# Patient Record
Sex: Male | Born: 1950 | Race: Black or African American | Hispanic: No | Smoking: Current every day smoker
Health system: Southern US, Community
[De-identification: ages and names within clinical notes are randomized; demographics above are authoritative.]

## PROBLEM LIST (undated history)

## (undated) DIAGNOSIS — N452 Orchitis: Secondary | ICD-10-CM

## (undated) DIAGNOSIS — N529 Male erectile dysfunction, unspecified: Secondary | ICD-10-CM

## (undated) DIAGNOSIS — J449 Chronic obstructive pulmonary disease, unspecified: Secondary | ICD-10-CM

## (undated) DIAGNOSIS — N200 Calculus of kidney: Secondary | ICD-10-CM

## (undated) DIAGNOSIS — M545 Low back pain, unspecified: Secondary | ICD-10-CM

## (undated) DIAGNOSIS — J45909 Unspecified asthma, uncomplicated: Secondary | ICD-10-CM

## (undated) DIAGNOSIS — M1990 Unspecified osteoarthritis, unspecified site: Secondary | ICD-10-CM

## (undated) DIAGNOSIS — N400 Enlarged prostate without lower urinary tract symptoms: Secondary | ICD-10-CM

## (undated) DIAGNOSIS — R2689 Other abnormalities of gait and mobility: Secondary | ICD-10-CM

## (undated) DIAGNOSIS — R3129 Other microscopic hematuria: Secondary | ICD-10-CM

## (undated) DIAGNOSIS — R3911 Hesitancy of micturition: Secondary | ICD-10-CM

## (undated) HISTORY — DX: Benign prostatic hyperplasia without lower urinary tract symptoms: N40.0

## (undated) HISTORY — DX: Chronic obstructive pulmonary disease, unspecified: J44.9

## (undated) HISTORY — DX: Male erectile dysfunction, unspecified: N52.9

## (undated) HISTORY — DX: Low back pain: M54.5

## (undated) HISTORY — DX: Other microscopic hematuria: R31.29

## (undated) HISTORY — DX: Low back pain, unspecified: M54.50

---

## 1997-12-16 ENCOUNTER — Ambulatory Visit (HOSPITAL_COMMUNITY): Admission: RE | Admit: 1997-12-16 | Discharge: 1997-12-16 | Payer: Self-pay | Admitting: Internal Medicine

## 1997-12-16 ENCOUNTER — Encounter: Payer: Self-pay | Admitting: Internal Medicine

## 1998-01-12 ENCOUNTER — Ambulatory Visit (HOSPITAL_COMMUNITY): Admission: RE | Admit: 1998-01-12 | Discharge: 1998-01-12 | Payer: Self-pay | Admitting: Orthopedic Surgery

## 1998-01-19 ENCOUNTER — Encounter: Payer: Self-pay | Admitting: Orthopedic Surgery

## 1998-01-19 ENCOUNTER — Ambulatory Visit (HOSPITAL_COMMUNITY): Admission: RE | Admit: 1998-01-19 | Discharge: 1998-01-19 | Payer: Self-pay | Admitting: Orthopedic Surgery

## 1998-06-14 ENCOUNTER — Encounter: Payer: Self-pay | Admitting: Internal Medicine

## 1998-06-14 ENCOUNTER — Ambulatory Visit (HOSPITAL_COMMUNITY): Admission: RE | Admit: 1998-06-14 | Discharge: 1998-06-14 | Payer: Self-pay | Admitting: Internal Medicine

## 1998-10-10 ENCOUNTER — Emergency Department (HOSPITAL_COMMUNITY): Admission: EM | Admit: 1998-10-10 | Discharge: 1998-10-10 | Payer: Self-pay | Admitting: Emergency Medicine

## 2002-11-20 ENCOUNTER — Encounter: Admission: RE | Admit: 2002-11-20 | Discharge: 2002-11-20 | Payer: Self-pay | Admitting: Internal Medicine

## 2002-11-20 ENCOUNTER — Encounter: Payer: Self-pay | Admitting: Internal Medicine

## 2004-02-10 ENCOUNTER — Ambulatory Visit: Payer: Self-pay | Admitting: Internal Medicine

## 2004-04-13 ENCOUNTER — Ambulatory Visit: Payer: Self-pay | Admitting: Internal Medicine

## 2004-06-07 ENCOUNTER — Ambulatory Visit: Payer: Self-pay | Admitting: Internal Medicine

## 2004-08-02 ENCOUNTER — Ambulatory Visit: Payer: Self-pay | Admitting: Internal Medicine

## 2004-10-11 ENCOUNTER — Ambulatory Visit: Payer: Self-pay | Admitting: Internal Medicine

## 2004-12-13 ENCOUNTER — Ambulatory Visit: Payer: Self-pay | Admitting: Internal Medicine

## 2005-03-15 ENCOUNTER — Ambulatory Visit: Payer: Self-pay | Admitting: Internal Medicine

## 2005-05-31 ENCOUNTER — Ambulatory Visit: Payer: Self-pay | Admitting: Internal Medicine

## 2005-08-01 ENCOUNTER — Ambulatory Visit: Payer: Self-pay | Admitting: Internal Medicine

## 2005-09-17 ENCOUNTER — Ambulatory Visit: Payer: Self-pay | Admitting: Internal Medicine

## 2005-11-22 ENCOUNTER — Ambulatory Visit: Payer: Self-pay | Admitting: Internal Medicine

## 2006-01-21 ENCOUNTER — Ambulatory Visit: Payer: Self-pay | Admitting: Internal Medicine

## 2006-04-24 ENCOUNTER — Ambulatory Visit: Payer: Self-pay | Admitting: Internal Medicine

## 2006-06-19 ENCOUNTER — Ambulatory Visit: Payer: Self-pay | Admitting: Internal Medicine

## 2006-09-26 ENCOUNTER — Ambulatory Visit: Payer: Self-pay | Admitting: Internal Medicine

## 2006-09-26 LAB — CONVERTED CEMR LAB
ALT: 84 units/L — ABNORMAL HIGH (ref 0–53)
AST: 38 units/L — ABNORMAL HIGH (ref 0–37)
Albumin: 3.8 g/dL (ref 3.5–5.2)
Alkaline Phosphatase: 47 units/L (ref 39–117)
BUN: 10 mg/dL (ref 6–23)
Basophils Absolute: 0.1 10*3/uL (ref 0.0–0.1)
Basophils Relative: 1.5 % — ABNORMAL HIGH (ref 0.0–1.0)
Bilirubin Urine: NEGATIVE
Bilirubin, Direct: 0.1 mg/dL (ref 0.0–0.3)
CO2: 28 meq/L (ref 19–32)
Calcium: 9 mg/dL (ref 8.4–10.5)
Chloride: 101 meq/L (ref 96–112)
Cholesterol: 217 mg/dL (ref 0–200)
Creatinine, Ser: 0.9 mg/dL (ref 0.4–1.5)
Direct LDL: 184.2 mg/dL
Eosinophils Absolute: 0.5 10*3/uL (ref 0.0–0.6)
Eosinophils Relative: 6.9 % — ABNORMAL HIGH (ref 0.0–5.0)
GFR calc Af Amer: 113 mL/min
GFR calc non Af Amer: 93 mL/min
Glucose, Bld: 107 mg/dL — ABNORMAL HIGH (ref 70–99)
HCT: 41.1 % (ref 39.0–52.0)
HDL: 33.3 mg/dL — ABNORMAL LOW (ref >39.0)
Hemoglobin, Urine: NEGATIVE
Hemoglobin: 14 g/dL (ref 13.0–17.0)
Hgb A1c MFr Bld: 5.1 % (ref 4.6–6.0)
Ketones, ur: NEGATIVE mg/dL
Leukocytes, UA: NEGATIVE
Lymphocytes Relative: 48.4 % — ABNORMAL HIGH (ref 12.0–46.0)
MCHC: 34 g/dL (ref 30.0–36.0)
MCV: 90.9 fL (ref 78.0–100.0)
Monocytes Absolute: 0.7 10*3/uL (ref 0.2–0.7)
Monocytes Relative: 11 % (ref 3.0–11.0)
Neutro Abs: 2.2 10*3/uL (ref 1.4–7.7)
Neutrophils Relative %: 32.2 % — ABNORMAL LOW (ref 43.0–77.0)
Nitrite: NEGATIVE
PSA: 4.18 ng/mL — ABNORMAL HIGH (ref 0.10–4.00)
Platelets: 200 10*3/uL (ref 150–400)
Potassium: 4.3 meq/L (ref 3.5–5.1)
RBC: 4.52 M/uL (ref 4.22–5.81)
RDW: 12.7 % (ref 11.5–14.6)
Sodium: 137 meq/L (ref 135–145)
Specific Gravity, Urine: 1.015 (ref 1.000–1.03)
TSH: 0.84 microintl units/mL (ref 0.35–5.50)
Total Bilirubin: 0.6 mg/dL (ref 0.3–1.2)
Total CHOL/HDL Ratio: 6.5
Total Protein, Urine: NEGATIVE mg/dL
Total Protein: 7.2 g/dL (ref 6.0–8.3)
Triglycerides: 148 mg/dL (ref 0–149)
Uric Acid, Serum: 6.6 mg/dL (ref 2.4–7.0)
Urine Glucose: NEGATIVE mg/dL
Urobilinogen, UA: 0.2 (ref 0.0–1.0)
VLDL: 30 mg/dL (ref 0–40)
Vit D, 1,25-Dihydroxy: 16 — ABNORMAL LOW (ref 20–57)
WBC: 6.7 10*3/uL (ref 4.5–10.5)
pH: 6.5 (ref 5.0–8.0)

## 2007-01-02 ENCOUNTER — Encounter: Payer: Self-pay | Admitting: Internal Medicine

## 2007-01-02 ENCOUNTER — Ambulatory Visit: Payer: Self-pay | Admitting: Internal Medicine

## 2007-01-02 DIAGNOSIS — M545 Low back pain: Secondary | ICD-10-CM

## 2007-01-02 DIAGNOSIS — N400 Enlarged prostate without lower urinary tract symptoms: Secondary | ICD-10-CM

## 2007-01-02 DIAGNOSIS — F4321 Adjustment disorder with depressed mood: Secondary | ICD-10-CM | POA: Insufficient documentation

## 2007-01-13 ENCOUNTER — Encounter: Payer: Self-pay | Admitting: Internal Medicine

## 2007-04-09 ENCOUNTER — Ambulatory Visit: Payer: Self-pay | Admitting: Internal Medicine

## 2007-04-09 DIAGNOSIS — K5900 Constipation, unspecified: Secondary | ICD-10-CM | POA: Insufficient documentation

## 2007-04-09 DIAGNOSIS — J449 Chronic obstructive pulmonary disease, unspecified: Secondary | ICD-10-CM

## 2007-04-17 ENCOUNTER — Encounter (INDEPENDENT_AMBULATORY_CARE_PROVIDER_SITE_OTHER): Payer: Self-pay | Admitting: *Deleted

## 2007-06-18 ENCOUNTER — Ambulatory Visit: Payer: Self-pay | Admitting: Internal Medicine

## 2007-06-18 DIAGNOSIS — N529 Male erectile dysfunction, unspecified: Secondary | ICD-10-CM

## 2007-06-19 ENCOUNTER — Ambulatory Visit: Payer: Self-pay | Admitting: Internal Medicine

## 2007-09-16 ENCOUNTER — Ambulatory Visit: Payer: Self-pay | Admitting: Internal Medicine

## 2007-09-16 DIAGNOSIS — R635 Abnormal weight gain: Secondary | ICD-10-CM

## 2007-09-16 DIAGNOSIS — R030 Elevated blood-pressure reading, without diagnosis of hypertension: Secondary | ICD-10-CM

## 2007-09-16 LAB — CONVERTED CEMR LAB
ALT: 26 units/L (ref 0–53)
AST: 22 units/L (ref 0–37)
Albumin: 3.8 g/dL (ref 3.5–5.2)
Alkaline Phosphatase: 40 units/L (ref 39–117)
Amphetamine Screen, Ur: NEGATIVE
BUN: 10 mg/dL (ref 6–23)
Barbiturate Quant, Ur: NEGATIVE
Basophils Absolute: 0 10*3/uL (ref 0.0–0.1)
Basophils Relative: 0 % (ref 0.0–1.0)
Benzodiazepines.: NEGATIVE
Bilirubin Urine: NEGATIVE
Bilirubin, Direct: 0.1 mg/dL (ref 0.0–0.3)
CO2: 27 meq/L (ref 19–32)
Calcium: 9.1 mg/dL (ref 8.4–10.5)
Chloride: 104 meq/L (ref 96–112)
Cholesterol: 199 mg/dL (ref 0–200)
Cocaine Metabolites: NEGATIVE
Creatinine, Ser: 0.9 mg/dL (ref 0.4–1.5)
Creatinine,U: 151.4 mg/dL
Eosinophils Absolute: 0.4 10*3/uL (ref 0.0–0.7)
Eosinophils Relative: 5.2 % — ABNORMAL HIGH (ref 0.0–5.0)
GFR calc Af Amer: 112 mL/min
GFR calc non Af Amer: 93 mL/min
Glucose, Bld: 83 mg/dL (ref 70–99)
HCT: 43.2 % (ref 39.0–52.0)
HDL: 34 mg/dL — ABNORMAL LOW (ref >39.0)
Hemoglobin: 14.3 g/dL (ref 13.0–17.0)
Hgb A1c MFr Bld: 5.3 % (ref 4.6–6.0)
Ketones, ur: NEGATIVE mg/dL
LDL Cholesterol: 146 mg/dL — ABNORMAL HIGH (ref 0–99)
Leukocytes, UA: NEGATIVE
Lymphocytes Relative: 35.9 % (ref 12.0–46.0)
MCHC: 33.2 g/dL (ref 30.0–36.0)
MCV: 92.4 fL (ref 78.0–100.0)
Marijuana Metabolite: POSITIVE — AB
Methadone: NEGATIVE
Monocytes Absolute: 0.8 10*3/uL (ref 0.1–1.0)
Monocytes Relative: 11 % (ref 3.0–12.0)
Neutro Abs: 3.2 10*3/uL (ref 1.4–7.7)
Neutrophils Relative %: 47.9 % (ref 43.0–77.0)
Nitrite: NEGATIVE
Opiate Screen, Urine: POSITIVE — AB
PSA: 1.43 ng/mL (ref 0.10–4.00)
Phencyclidine (PCP): NEGATIVE
Platelets: 231 10*3/uL (ref 150–400)
Potassium: 3.7 meq/L (ref 3.5–5.1)
Propoxyphene: NEGATIVE
RBC: 4.68 M/uL (ref 4.22–5.81)
RDW: 12.2 % (ref 11.5–14.6)
Sodium: 138 meq/L (ref 135–145)
Specific Gravity, Urine: 1.025 (ref 1.000–1.03)
TSH: 0.61 microintl units/mL (ref 0.35–5.50)
Total Bilirubin: 0.5 mg/dL (ref 0.3–1.2)
Total CHOL/HDL Ratio: 5.9
Total Protein, Urine: NEGATIVE mg/dL
Total Protein: 6.8 g/dL (ref 6.0–8.3)
Triglycerides: 97 mg/dL (ref 0–149)
Urine Glucose: NEGATIVE mg/dL
Urobilinogen, UA: 0.2 (ref 0.0–1.0)
VLDL: 19 mg/dL (ref 0–40)
WBC: 6.9 10*3/uL (ref 4.5–10.5)
pH: 5.5 (ref 5.0–8.0)

## 2007-09-18 ENCOUNTER — Encounter: Payer: Self-pay | Admitting: Internal Medicine

## 2007-12-22 ENCOUNTER — Ambulatory Visit: Payer: Self-pay | Admitting: Internal Medicine

## 2007-12-24 ENCOUNTER — Telehealth: Payer: Self-pay | Admitting: Internal Medicine

## 2008-01-13 ENCOUNTER — Ambulatory Visit: Payer: Self-pay | Admitting: Internal Medicine

## 2008-03-01 ENCOUNTER — Telehealth: Payer: Self-pay | Admitting: Internal Medicine

## 2008-03-31 ENCOUNTER — Ambulatory Visit: Payer: Self-pay | Admitting: Internal Medicine

## 2008-03-31 ENCOUNTER — Telehealth: Payer: Self-pay | Admitting: Internal Medicine

## 2008-04-14 ENCOUNTER — Telehealth: Payer: Self-pay | Admitting: Internal Medicine

## 2008-04-14 ENCOUNTER — Ambulatory Visit: Payer: Self-pay | Admitting: Internal Medicine

## 2008-06-07 ENCOUNTER — Ambulatory Visit: Payer: Self-pay | Admitting: Internal Medicine

## 2008-06-07 ENCOUNTER — Telehealth: Payer: Self-pay | Admitting: Internal Medicine

## 2008-06-07 LAB — CONVERTED CEMR LAB
ALT: 36 units/L (ref 0–53)
AST: 24 units/L (ref 0–37)
Albumin: 3.8 g/dL (ref 3.5–5.2)
Alkaline Phosphatase: 39 units/L (ref 39–117)
Amphetamine Screen, Ur: NEGATIVE
BUN: 12 mg/dL (ref 6–23)
Barbiturate Quant, Ur: NEGATIVE
Benzodiazepines.: NEGATIVE
Bilirubin Urine: NEGATIVE
Bilirubin, Direct: 0 mg/dL (ref 0.0–0.3)
CO2: 31 meq/L (ref 19–32)
Calcium: 9.5 mg/dL (ref 8.4–10.5)
Chloride: 103 meq/L (ref 96–112)
Cholesterol: 213 mg/dL — ABNORMAL HIGH (ref 0–200)
Cocaine Metabolites: NEGATIVE
Creatinine, Ser: 0.9 mg/dL (ref 0.4–1.5)
Creatinine,U: 76.9 mg/dL
Direct LDL: 163.3 mg/dL
GFR calc non Af Amer: 111.86 mL/min (ref >60)
Glucose, Bld: 88 mg/dL (ref 70–99)
HDL: 33.6 mg/dL — ABNORMAL LOW (ref >39.00)
Hgb A1c MFr Bld: 5.3 % (ref 4.6–6.5)
Ketones, ur: NEGATIVE mg/dL
Leukocytes, UA: NEGATIVE
Marijuana Metabolite: NEGATIVE
Methadone: NEGATIVE
Nitrite: NEGATIVE
Opiate Screen, Urine: NEGATIVE
Phencyclidine (PCP): NEGATIVE
Potassium: 4 meq/L (ref 3.5–5.1)
Propoxyphene: NEGATIVE
Sodium: 140 meq/L (ref 135–145)
Specific Gravity, Urine: 1.015 (ref 1.000–1.030)
TSH: 1.06 microintl units/mL (ref 0.35–5.50)
Total Bilirubin: 0.6 mg/dL (ref 0.3–1.2)
Total CHOL/HDL Ratio: 6
Total Protein, Urine: NEGATIVE mg/dL
Total Protein: 7.3 g/dL (ref 6.0–8.3)
Triglycerides: 139 mg/dL (ref 0.0–149.0)
Urine Glucose: NEGATIVE mg/dL
Urobilinogen, UA: 0.2 (ref 0.0–1.0)
VLDL: 27.8 mg/dL (ref 0.0–40.0)
pH: 7.5 (ref 5.0–8.0)

## 2008-06-10 ENCOUNTER — Ambulatory Visit: Payer: Self-pay | Admitting: Internal Medicine

## 2008-06-10 DIAGNOSIS — R3129 Other microscopic hematuria: Secondary | ICD-10-CM | POA: Insufficient documentation

## 2008-06-10 DIAGNOSIS — E785 Hyperlipidemia, unspecified: Secondary | ICD-10-CM

## 2008-06-30 ENCOUNTER — Encounter (INDEPENDENT_AMBULATORY_CARE_PROVIDER_SITE_OTHER): Payer: Self-pay | Admitting: *Deleted

## 2008-07-28 ENCOUNTER — Telehealth: Payer: Self-pay | Admitting: Internal Medicine

## 2008-08-06 ENCOUNTER — Encounter: Payer: Self-pay | Admitting: Internal Medicine

## 2008-09-08 ENCOUNTER — Ambulatory Visit: Payer: Self-pay | Admitting: Internal Medicine

## 2008-12-13 ENCOUNTER — Ambulatory Visit: Payer: Self-pay | Admitting: Internal Medicine

## 2009-03-17 ENCOUNTER — Ambulatory Visit: Payer: Self-pay | Admitting: Internal Medicine

## 2009-03-17 LAB — CONVERTED CEMR LAB
ALT: 40 units/L (ref 0–53)
Albumin: 3.9 g/dL (ref 3.5–5.2)
Alkaline Phosphatase: 43 units/L (ref 39–117)
Basophils Relative: 1.2 % (ref 0.0–3.0)
Bilirubin, Direct: 0.1 mg/dL (ref 0.0–0.3)
CO2: 29 meq/L (ref 19–32)
Calcium: 9.5 mg/dL (ref 8.4–10.5)
Chloride: 107 meq/L (ref 96–112)
Creatinine, Ser: 1 mg/dL (ref 0.4–1.5)
Eosinophils Absolute: 0.2 10*3/uL (ref 0.0–0.7)
Glucose, Bld: 76 mg/dL (ref 70–99)
Leukocytes, UA: NEGATIVE
Lymphocytes Relative: 38.4 % (ref 12.0–46.0)
MCHC: 31.9 g/dL (ref 30.0–36.0)
MCV: 94.1 fL (ref 78.0–100.0)
Monocytes Absolute: 1 10*3/uL (ref 0.1–1.0)
Neutrophils Relative %: 43.6 % (ref 43.0–77.0)
Nitrite: NEGATIVE
Platelets: 199 10*3/uL (ref 150.0–400.0)
RBC: 4.61 M/uL (ref 4.22–5.81)
Specific Gravity, Urine: 1.02 (ref 1.000–1.030)
Total Protein: 7.7 g/dL (ref 6.0–8.3)
Urobilinogen, UA: 0.2 (ref 0.0–1.0)
WBC: 7 10*3/uL (ref 4.5–10.5)

## 2009-04-01 ENCOUNTER — Telehealth: Payer: Self-pay | Admitting: Internal Medicine

## 2009-04-01 ENCOUNTER — Ambulatory Visit: Payer: Self-pay | Admitting: Internal Medicine

## 2009-06-30 ENCOUNTER — Telehealth: Payer: Self-pay | Admitting: Internal Medicine

## 2009-07-08 ENCOUNTER — Ambulatory Visit: Payer: Self-pay | Admitting: Internal Medicine

## 2009-09-27 ENCOUNTER — Telehealth: Payer: Self-pay | Admitting: Internal Medicine

## 2009-10-28 ENCOUNTER — Ambulatory Visit: Payer: Self-pay | Admitting: Internal Medicine

## 2009-12-13 ENCOUNTER — Ambulatory Visit: Payer: Self-pay | Admitting: Internal Medicine

## 2009-12-14 LAB — CONVERTED CEMR LAB
Bilirubin, Direct: 0.1 mg/dL (ref 0.0–0.3)
Calcium: 9.8 mg/dL (ref 8.4–10.5)
Cholesterol: 196 mg/dL (ref 0–200)
Eosinophils Absolute: 0.2 10*3/uL (ref 0.0–0.7)
Eosinophils Relative: 2.6 % (ref 0.0–5.0)
GFR calc non Af Amer: 100.85 mL/min (ref >60)
HCT: 43.4 % (ref 39.0–52.0)
Hemoglobin: 14.5 g/dL (ref 13.0–17.0)
LDL Cholesterol: 142 mg/dL — ABNORMAL HIGH (ref 0–99)
Lymphocytes Relative: 39.6 % (ref 12.0–46.0)
Monocytes Relative: 10.8 % (ref 3.0–12.0)
Neutro Abs: 3.1 10*3/uL (ref 1.4–7.7)
Potassium: 4.3 meq/L (ref 3.5–5.1)
RBC: 4.69 M/uL (ref 4.22–5.81)
RDW: 13.6 % (ref 11.5–14.6)
Sodium: 141 meq/L (ref 135–145)
TSH: 0.75 microintl units/mL (ref 0.35–5.50)
Total Bilirubin: 0.6 mg/dL (ref 0.3–1.2)
Total Protein, Urine: NEGATIVE mg/dL
Total Protein: 7.5 g/dL (ref 6.0–8.3)
Triglycerides: 67 mg/dL (ref 0.0–149.0)
Urine Glucose: NEGATIVE mg/dL
WBC: 7.1 10*3/uL (ref 4.5–10.5)
pH: 6.5 (ref 5.0–8.0)

## 2010-01-13 ENCOUNTER — Ambulatory Visit: Payer: Self-pay | Admitting: Internal Medicine

## 2010-01-13 DIAGNOSIS — M810 Age-related osteoporosis without current pathological fracture: Secondary | ICD-10-CM | POA: Insufficient documentation

## 2010-04-13 NOTE — Assessment & Plan Note (Signed)
Summary: 3 MTH FU  STC   Vital Signs:  Patient profile:   60 year old male Weight:      204 pounds BMI:     32.07 Temp:     97.8 degrees F oral Pulse rate:   103 / minute BP sitting:   132 / 84  (left arm)  Vitals Entered By: Tora Perches (April 01, 2009 8:55 AM) CC: f/u   CC:  f/u.  History of Present Illness: The patient presents for a follow up of back pain - worse, anxiety, BPH.   Preventive Screening-Counseling & Management  Alcohol-Tobacco     Smoking Status: quit  Current Medications (verified): 1)  Vicodin Es 7.5-750 Mg Tabs (Hydrocodone-Acetaminophen) .Marland Kitchen.. 1 Qid As Needed Pain 2)  Flexeril 10 Mg Tabs (Cyclobenzaprine Hcl) .... Take 1 Tablet By Mouth Three Times A Day As Needed Spasm 3)  Flomax 0.4 Mg Cp24 (Tamsulosin Hcl) .Marland Kitchen.. 1 Once Daily Po 4)  Celebrex 200 Mg  Caps (Celecoxib) .Marland Kitchen.. 1 Two Times A Day Pc 5)  Base D Polyethylene Glycol   Powd (Polyethylene Glycol 4500) .Marland KitchenMarland KitchenMarland Kitchen 17 G By Mouth Once Daily As Needed Constipation 6)  Amlodipine Besylate 5 Mg  Tabs (Amlodipine Besylate) .Marland Kitchen.. 1 By Mouth Every Day 7)  Vitamin D3 1000 Unit  Tabs (Cholecalciferol) .Marland Kitchen.. 1 Qd 8)  Viagra 100 Mg Tabs (Sildenafil Citrate) .Marland Kitchen.. 1 By Mouth Once Daily Prn 9)  Proventil Hfa 108 (90 Base) Mcg/act Aers (Albuterol Sulfate) .... 2 Inh Qid As Needed Wheezing  Allergies (verified): No Known Drug Allergies  Past History:  Past Medical History: Last updated: 06/10/2008 Low back pain Benign prostatic hypertrophy COPD ED Hyperlipidemia  Social History: Last updated: 03/31/2008 Occupation: on disability Single widower 2008, has a new GF 2010 Current Smoker Alcohol use-yes beer 6 a day Started to exercise 2009  Social History: Smoking Status:  quit  Review of Systems  The patient denies fever, weight gain, syncope, and abdominal pain.         On a diet  Physical Exam  General:  obese.   Ears:  External ear exam shows no significant lesions or deformities.  Otoscopic  examination reveals clear canals, tympanic membranes are intact bilaterally without bulging, retraction, inflammation or discharge. Hearing is grossly normal bilaterally. Nose:  External nasal examination shows no deformity or inflammation. Nasal mucosa are pink and moist without lesions or exudates. Mouth:  Oral mucosa and oropharynx without lesions or exudates.  Teeth in good repair. Neck:  No deformities, masses, or tenderness noted. Lungs:  Normal respiratory effort, chest expands symmetrically. Lungs are clear to auscultation, no crackles or wheezes. Heart:  Normal rate and regular rhythm. S1 and S2 normal without gallop, murmur, click, rub or other extra sounds. Abdomen:  Bowel sounds positive,abdomen soft and non-tender without masses, organomegaly or hernias noted. Msk:  No deformity or scoliosis noted of thoracic or lumbar spine.  Lumbar-sacral spine is tender to palpation over paraspinal muscles and painfull with the ROM  Neurologic:  No cranial nerve deficits noted. Station and gait are normal. Plantar reflexes are down-going bilaterally. DTRs are symmetrical throughout. Sensory, motor and coordinative functions appear intact. Skin:  Intact without suspicious lesions or rashes Psych:  Cognition and judgment appear intact. Alert and cooperative with normal attention span and concentration. No apparent delusions, illusions, hallucinations   Impression & Recommendations:  Problem # 1:  LOW BACK PAIN (ICD-724.2) Assessment Deteriorated  The following medications were removed from the medication list:  Vicodin Es 7.5-750 Mg Tabs (Hydrocodone-acetaminophen) .Marland Kitchen... 1 qid as needed pain His updated medication list for this problem includes:    Flexeril 10 Mg Tabs (Cyclobenzaprine hcl) .Marland Kitchen... Take 1 tablet by mouth three times a day as needed spasm    Celebrex 200 Mg Caps (Celecoxib) .Marland Kitchen... 1 two times a day pc    Hydrocodone-acetaminophen 10-325 Mg Tabs (Hydrocodone-acetaminophen) .Marland Kitchen... 1  by mouth two times a day by mouth qid prn  Problem # 2:  ERECTILE DYSFUNCTION (ICD-607.84) Assessment: Unchanged  His updated medication list for this problem includes:    Viagra 100 Mg Tabs (Sildenafil citrate) .Marland Kitchen... 1 by mouth once daily prn  Problem # 3:  WEIGHT GAIN (ICD-783.1) Assessment: Improved The labs were reviewed with the patient.   Problem # 4:  COPD (ICD-496) Assessment: Unchanged  His updated medication list for this problem includes:    Proventil Hfa 108 (90 Base) Mcg/act Aers (Albuterol sulfate) .Marland Kitchen... 2 inh qid as needed wheezing  Problem # 5:  TOBACCO USE DISORDER/SMOKER-SMOKING CESSATION DISCUSSED (ICD-305.1) Assessment: Unchanged  Encouraged smoking cessation and discussed different methods for smoking cessation.   Complete Medication List: 1)  Flexeril 10 Mg Tabs (Cyclobenzaprine hcl) .... Take 1 tablet by mouth three times a day as needed spasm 2)  Flomax 0.4 Mg Cp24 (Tamsulosin hcl) .Marland Kitchen.. 1 once daily po 3)  Celebrex 200 Mg Caps (Celecoxib) .Marland Kitchen.. 1 two times a day pc 4)  Base D Polyethylene Glycol Powd (Polyethylene glycol 4500) .Marland KitchenMarland KitchenMarland Kitchen 17 g by mouth once daily as needed constipation 5)  Amlodipine Besylate 5 Mg Tabs (Amlodipine besylate) .Marland Kitchen.. 1 by mouth every day 6)  Vitamin D3 1000 Unit Tabs (Cholecalciferol) .Marland Kitchen.. 1 qd 7)  Viagra 100 Mg Tabs (Sildenafil citrate) .Marland Kitchen.. 1 by mouth once daily prn 8)  Proventil Hfa 108 (90 Base) Mcg/act Aers (Albuterol sulfate) .... 2 inh qid as needed wheezing 9)  Hydrocodone-acetaminophen 10-325 Mg Tabs (Hydrocodone-acetaminophen) .Marland Kitchen.. 1 by mouth two times a day by mouth qid prn  Patient Instructions: 1)  Please schedule a follow-up appointment in 3 months. 2)  Try to eat more raw plant food, fresh and dry fruit, raw almonds, leafy vegetables, whole foods and less red meat, less animal fat. Poultry and fish is better for you than pork and beef. Avoid processed foods (canned soups, hot dogs, sausage, bacon , frozen dinners). Avoid  corn syrup, high fructose syrup or aspartam and Splenda  containing drinks. Honey, Agave and Stevia are better sweeteners. Make your own  dressing with olive oil, wine vinegar, lemon juce, garlic etc. for your salads. Prescriptions: VIAGRA 100 MG TABS (SILDENAFIL CITRATE) 1 by mouth once daily prn  #36 x 3   Entered and Authorized by:   Tresa Garter MD   Signed by:   Tresa Garter MD on 04/01/2009   Method used:   Print then Give to Patient   RxID:   [id number] VITAMIN D3 1000 UNIT  TABS (CHOLECALCIFEROL) 1 qd  #90 x 3   Entered and Authorized by:   Tresa Garter MD   Signed by:   Tresa Garter MD on 04/01/2009   Method used:   Print then Give to Patient   RxID:   [id number] AMLODIPINE BESYLATE 5 MG  TABS (AMLODIPINE BESYLATE) 1 by mouth every day  #90 x 3   Entered and Authorized by:   Tresa Garter MD   Signed by:   Tresa Garter MD on 04/01/2009   Method used:  Print then Give to Patient   RxID:   [id number] BASE D POLYETHYLENE GLYCOL   POWD (POLYETHYLENE GLYCOL 4500) 17 g by mouth once daily as needed constipation  #qs x 3 mo x 3   Entered and Authorized by:   Tresa Garter MD   Signed by:   Tresa Garter MD on 04/01/2009   Method used:   Print then Give to Patient   RxID:   [id number] CELEBREX 200 MG  CAPS (CELECOXIB) 1 two times a day pc  #90 x 3   Entered and Authorized by:   Tresa Garter MD   Signed by:   Tresa Garter MD on 04/01/2009   Method used:   Print then Give to Patient   RxID:   [id number] FLOMAX 0.4 MG CP24 (TAMSULOSIN HCL) 1 once daily po  #90 x 3   Entered and Authorized by:   Tresa Garter MD   Signed by:   Tresa Garter MD on 04/01/2009   Method used:   Print then Give to Patient   RxID:   [id number] FLEXERIL 10 MG TABS (CYCLOBENZAPRINE HCL) Take 1 tablet by mouth three times a day as needed spasm  #270 x 1   Entered and Authorized by:   Tresa Garter MD   Signed by:   Tresa Garter MD on 04/01/2009   Method used:   Print then Give to Patient   RxID:   [id number] HYDROCODONE-ACETAMINOPHEN 10-325 MG TABS (HYDROCODONE-ACETAMINOPHEN) 1 by mouth two times a day by mouth qid prn  #120 x 2   Entered and Authorized by:   Tresa Garter MD   Signed by:   Tresa Garter MD on 04/01/2009   Method used:   Print then Give to Patient   RxID:   [id number]

## 2010-04-13 NOTE — Progress Notes (Signed)
Summary: REFILLS  Phone Note Refill Request   Refills Requested: Medication #1:  HYDROCODONE-ACETAMINOPHEN 10-325 MG TABS 1 by mouth two times a day by mouth qid prn.  Medication #2:  FLEXERIL 10 MG TABS Take 1 tablet by mouth three times a day as needed spasm Initial call taken by: Lamar Sprinkles, CMA,  June 30, 2009 10:45 AM  Follow-up for Phone Call        ok to ref Follow-up by: Tresa Garter MD,  June 30, 2009 1:12 PM  Additional Follow-up for Phone Call Additional follow up Details #1::        sent refill on flexeril. called hydrocodone refill into pharm already Additional Follow-up by: Orlan Leavens,  June 30, 2009 4:01 PM    Prescriptions: FLEXERIL 10 MG TABS (CYCLOBENZAPRINE HCL) Take 1 tablet by mouth three times a day as needed spasm  #270 x 1   Entered by:   Orlan Leavens   Authorized by:   Tresa Garter MD   Signed by:   Orlan Leavens on 06/30/2009   Method used:   Electronically to        [facility] [address] [phone]* (retail)       [address]       [address]       [place], [place]  [address]       Ph: [phone]       Fax: [phone]   RxID:   [id number] PROVENTIL HFA 108 (90 BASE) MCG/ACT AERS (ALBUTEROL SULFATE) 2 inh qid as needed wheezing  #3 x 1   Entered by:   Lamar Sprinkles, CMA   Authorized by:   Tresa Garter MD   Signed by:   Lamar Sprinkles, CMA on 06/30/2009   Method used:   Electronically to        [facility] [address] [phone]* (retail)       [address]       [address]       [place], [place]  [address]       Ph: [phone]       Fax: [phone]   RxID:   [id number] AMLODIPINE BESYLATE 5 MG  TABS (AMLODIPINE BESYLATE) 1 by mouth every day  #90 x 1   Entered by:   Lamar Sprinkles, CMA   Authorized by:   Tresa Garter MD   Signed by:   Lamar Sprinkles, CMA on 06/30/2009   Method used:   Electronically to        [facility] [address] [phone]* (retail)       [address]       [address]  [place], [place]  [address]       Ph: [phone]       Fax: [phone]   RxID:   [id number] BASE D POLYETHYLENE GLYCOL   POWD (POLYETHYLENE GLYCOL 4500) 17 g by mouth once daily as needed constipation  #qs x 3 mo x 1   Entered by:   Lamar Sprinkles, CMA   Authorized by:   Tresa Garter MD   Signed by:   Lamar Sprinkles, CMA on 06/30/2009   Method used:   Faxed to ...       [facility] [address] [phone]* (retail)       [address]       [address]       [place], [place]  [address]       Ph: [phone]  Fax: [phone]   RxID:   [id number] CELEBREX 200 MG  CAPS (CELECOXIB) 1 two times a day pc  #90 x 1   Entered by:   Lamar Sprinkles, CMA   Authorized by:   Tresa Garter MD   Signed by:   Lamar Sprinkles, CMA on 06/30/2009   Method used:   Electronically to        [facility] [address] [phone]* (retail)       [address]       [address]       [place], [place]  [address]       Ph: [phone]       Fax: [phone]   RxID:   [id number] FLOMAX 0.4 MG CP24 (TAMSULOSIN HCL) 1 once daily po  #90 x 1   Entered by:   Lamar Sprinkles, CMA   Authorized by:   Tresa Garter MD   Signed by:   Lamar Sprinkles, CMA on 06/30/2009   Method used:   Electronically to        [facility] [address] [phone]* (retail)       [address]       [address]       [place], [place]  [address]       Ph: [phone]       Fax: [phone]   RxID:   [id number]

## 2010-04-13 NOTE — Assessment & Plan Note (Signed)
Summary: 3 mth yearly--stc   Vital Signs:  Patient profile:   60 year old male Height:      67 inches Weight:      195 pounds BMI:     30.65 Temp:     98.6 degrees F oral Pulse rate:   100 / minute Pulse rhythm:   regular Resp:     16 per minute BP sitting:   110 / 72  (left arm) Cuff size:   regular  Vitals Entered By: Lanier Prude, Beverly Gust) (January 13, 2010 10:29 AM) CC: MWV Is Patient Diabetic? No Comments pt declines EKG   CC:  MWV.  History of Present Illness: The patient presents for a preventive health examination  Patient past medical history, social history, and family history reviewed in detail no significant changes.  Patient is physically active. Depression is negative and mood is good. Hearing is normal, and able to perform activities of daily living. Risk of falling is negligible and home safety has been reviewed and is appropriate. Patient has normal height, he is overweight, and visual acuity is good with glasses . Patient has been counseled on age-appropriate routine health concerns for screening and prevention. Education, counseling done.  The patient presents for a follow up of LBP, COPD, BPH  Preventive Screening-Counseling & Management  Alcohol-Tobacco     Alcohol drinks/day: <1     Alcohol type: spirits     Smoking Status: current     Smoking Cessation Counseling: yes     Packs/Day: 1.0     Tobacco Counseling: to quit use of tobacco products  Caffeine-Diet-Exercise     Caffeine Counseling: not indicated; caffeine use is not excessive or problematic     Diet Counseling: to improve diet; diet is suboptimal     Does Patient Exercise: no     Exercise Counseling: to improve exercise regimen     Depression Counseling: not indicated; screening negative for depression  Hep-HIV-STD-Contraception     Hepatitis Risk: no risk noted     Sun Exposure-Excessive: no  Safety-Violence-Falls     Seat Belt Use: yes     Fall Risk Counseling: not indicated; no  significant falls noted      Drug Use:  no.    Comments: denies drugs  Current Medications (verified): 1)  Flexeril 10 Mg Tabs (Cyclobenzaprine Hcl) .... Take 1 Tablet By Mouth Three Times A Day As Needed Spasm 2)  Flomax 0.4 Mg Cp24 (Tamsulosin Hcl) .Marland Kitchen.. 1 Once Daily Po 3)  Celebrex 200 Mg  Caps (Celecoxib) .Marland Kitchen.. 1 Two Times A Day Pc 4)  Base D Polyethylene Glycol   Powd (Polyethylene Glycol 4500) .Marland KitchenMarland KitchenMarland Kitchen 17 G By Mouth Once Daily As Needed Constipation 5)  Amlodipine Besylate 5 Mg  Tabs (Amlodipine Besylate) .Marland Kitchen.. 1 By Mouth Every Day 6)  Vitamin D3 1000 Unit  Tabs (Cholecalciferol) .Marland Kitchen.. 1 Qd 7)  Viagra 100 Mg Tabs (Sildenafil Citrate) .Marland Kitchen.. 1 By Mouth Once Daily Prn 8)  Proventil Hfa 108 (90 Base) Mcg/act Aers (Albuterol Sulfate) .... 2 Inh Qid As Needed Wheezing 9)  Hydrocodone-Acetaminophen 10-325 Mg Tabs (Hydrocodone-Acetaminophen) .Marland Kitchen.. 1 By Mouth Three To Four Times Daily As Needed  Allergies (verified): No Known Drug Allergies  Past History:  Family History: Last updated: 04/09/2007 Family History Hypertension  Past Medical History: Low back pain Benign prostatic hypertrophy COPD ED Hyperlipidemia Osteoporosis Microscopic hematuria s/p Urol w/up  Past Surgical History: Denies surgical history  Social History: Occupation: on disability Single widower 2008, has a  new GF 2010 Current Smoker Alcohol use-yes beer 6 a day Started to exercise 2009 Drug use-no Smoking Status:  current Packs/Day:  1.0 Does Patient Exercise:  no Hepatitis Risk:  no risk noted Sun Exposure-Excessive:  no Seat Belt Use:  yes Drug Use:  no  Review of Systems       The patient complains of weight gain.  The patient denies anorexia, fever, weight loss, vision loss, decreased hearing, hoarseness, chest pain, syncope, dyspnea on exertion, peripheral edema, prolonged cough, headaches, hemoptysis, abdominal pain, melena, hematochezia, severe indigestion/heartburn, hematuria, incontinence,  genital sores, muscle weakness, suspicious skin lesions, transient blindness, difficulty walking, depression, unusual weight change, abnormal bleeding, enlarged lymph nodes, angioedema, and testicular masses.         LBP  Physical Exam  General:  NAD obese.   Ears:  External ear exam shows no significant lesions or deformities.  Otoscopic examination reveals clear canals, tympanic membranes are intact bilaterally without bulging, retraction, inflammation or discharge. Hearing is grossly normal bilaterally. Nose:  External nasal examination shows no deformity or inflammation. Nasal mucosa are pink and moist without lesions or exudates. Mouth:  Oral mucosa and oropharynx without lesions or exudates.  Teeth in good repair. Neck:  No deformities, masses, or tenderness noted. Lungs:  Normal respiratory effort, chest expands symmetrically. Lungs are clear to auscultation, no crackles or wheezes. Heart:  Normal rate and regular rhythm. S1 and S2 normal without gallop, murmur, click, rub or other extra sounds. Abdomen:  Bowel sounds positive,abdomen soft and non-tender without masses, organomegaly or hernias noted. Large abdomen Msk:  No deformity or scoliosis noted of thoracic or lumbar spine.  Lumbar-sacral spine is tender to palpation over paraspinal muscles and painfull with the ROM  Neurologic:  No cranial nerve deficits noted. Station and gait are normal. Plantar reflexes are down-going bilaterally. DTRs are symmetrical throughout. Sensory, motor and coordinative functions appear intact. Skin:  Intact without suspicious lesions or rashes Psych:  Cognition and judgment appear intact. Alert and cooperative with normal attention span and concentration. No apparent delusions, illusions, hallucinations   Impression & Recommendations:  Problem # 1:  Preventive Health Care (ICD-V70.0) Assessment New Overall doing well, age appropriate education and counseling updated and referral for appropriate  preventive services done unless declined, immunizations up to date or declined, diet counseling done if overweight, urged to quit smoking if smokes, most recent labs reviewed and current ordered if appropriate, ecg reviewed or declined (interpretation per ECG scanned in the EMR if done); information regarding Medicare Prevention requirements given if appropriate. Declined Colonoscopy  Problem # 2:  TOBACCO USE DISORDER/SMOKER-SMOKING CESSATION DISCUSSED (ICD-305.1) Assessment: Unchanged  Encouraged smoking cessation and discussed different methods for smoking cessation.   Problem # 3:  MICROSCOPIC HEMATURIA (ICD-599.72) Assessment: Unchanged s/p Urol eval before Declined repeat eval  Problem # 4:  LOW BACK PAIN (ICD-724.2) Assessment: Unchanged  His updated medication list for this problem includes:    Flexeril 10 Mg Tabs (Cyclobenzaprine hcl) .Marland Kitchen... Take 1 tablet by mouth three times a day as needed spasm    Celebrex 200 Mg Caps (Celecoxib) .Marland Kitchen... 1 two times a day pc    Hydrocodone-acetaminophen 10-325 Mg Tabs (Hydrocodone-acetaminophen) .Marland Kitchen... 1 by mouth three to four times daily as needed  Problem # 5:  WEIGHT GAIN (ICD-783.1) Assessment: Unchanged Discussed diet  Complete Medication List: 1)  Flexeril 10 Mg Tabs (Cyclobenzaprine hcl) .... Take 1 tablet by mouth three times a day as needed spasm 2)  Flomax 0.4 Mg Cp24 (  Tamsulosin hcl) .Marland Kitchen.. 1 once daily po 3)  Celebrex 200 Mg Caps (Celecoxib) .Marland Kitchen.. 1 two times a day pc 4)  Base D Polyethylene Glycol Powd (Polyethylene glycol 4500) .Marland KitchenMarland KitchenMarland Kitchen 17 g by mouth once daily as needed constipation 5)  Amlodipine Besylate 5 Mg Tabs (Amlodipine besylate) .Marland Kitchen.. 1 by mouth every day 6)  Vitamin D3 1000 Unit Tabs (Cholecalciferol) .Marland Kitchen.. 1 qd 7)  Viagra 100 Mg Tabs (Sildenafil citrate) .Marland Kitchen.. 1 by mouth once daily prn 8)  Proventil Hfa 108 (90 Base) Mcg/act Aers (Albuterol sulfate) .... 2 inh qid as needed wheezing 9)  Hydrocodone-acetaminophen 10-325 Mg Tabs  (Hydrocodone-acetaminophen) .Marland Kitchen.. 1 by mouth three to four times daily as needed  Other Orders: Medicare -1st Annual Wellness Visit [id number]) Tdap => 67yrs IM (11914) Admin 1st Vaccine (78295) Flu Vaccine 73yrs + MEDICARE PATIENTS (A2130) Administration Flu vaccine - MCR (Q6578)  Patient Instructions: 1)  Please schedule a follow-up appointment in 3 months. Prescriptions: HYDROCODONE-ACETAMINOPHEN 10-325 MG TABS (HYDROCODONE-ACETAMINOPHEN) 1 by mouth three to four times daily as needed  #120 x 2   Entered and Authorized by:   Tresa Garter MD   Signed by:   Tresa Garter MD on 01/13/2010   Method used:   Print then Give to Patient   RxID:   [id number]    Orders Added: 1)  Medicare -1st Annual Wellness Visit [G0438] 2)  Tdap => 34yrs IM [90715] 3)  Admin 1st Vaccine [90471] 4)  Flu Vaccine 40yrs + MEDICARE PATIENTS [Q2039] 5)  Administration Flu vaccine - MCR [G0008] 6)  Est. Patient Level IV [10272]   Immunizations Administered:  Tetanus Vaccine:    Vaccine Type: Tdap    Site: left deltoid    Mfr: GlaxoSmithKline    Dose: 0.5 ml    Route: IM    Given by: Lanier Prude, CMA(AAMA)    Exp. Date: 12/30/2011    Lot #: [id number]    VIS given: 01/28/08 version given January 13, 2010.   Contraindications/Deferment of Procedures/Staging:    Test/Procedure: Colonoscopy    Reason for deferment: patient declined   Immunizations Administered:  Tetanus Vaccine:    Vaccine Type: Tdap    Site: left deltoid    Mfr: GlaxoSmithKline    Dose: 0.5 ml    Route: IM    Given by: Lanier Prude, CMA(AAMA)    Exp. Date: 12/30/2011    Lot #: [id number]    VIS given: 01/28/08 version given January 13, 2010.  Influenza Vaccine (to be given today)     Flu Vaccine Consent Questions     Do you have a history of severe allergic reactions to this vaccine? no    Any prior history of allergic reactions to egg and/or gelatin? no    Do you have a sensitivity to the  preservative Thimersol? no    Do you have a past history of Guillan-Barre Syndrome? no    Do you currently have an acute febrile illness? no    Have you ever had a severe reaction to latex? no    Vaccine information given and explained to patient? yes    Are you currently pregnant? no    Lot Number:[id number]   Exp Date:09/09/2010   Site Given  Right Deltoid IM Lanier Prude, [facility])  January 13, 2010 11:14 AM

## 2010-04-13 NOTE — Progress Notes (Signed)
Summary: Rf Hydroco/Acetam  Phone Note Refill Request Message from:  Pharmacy  Refills Requested: Medication #1:  HYDROCODONE-ACETAMINOPHEN 10-325 MG TABS 1 by mouth two times a day by mouth qid prn.   Dosage confirmed as above?Dosage Confirmed *****[facility]   Method Requested: Telephone to Pharmacy Initial call taken by: Lanier Prude, [facility]),  September 27, 2009 9:22 AM  Follow-up for Phone Call        ok to ref Follow-up by: Tresa Garter MD,  September 27, 2009 1:16 PM  Additional Follow-up for Phone Call Additional follow up Details #1::        Rx called to pharmacy Additional Follow-up by: Lanier Prude, [facility]),  September 27, 2009 1:38 PM    Prescriptions: HYDROCODONE-ACETAMINOPHEN 10-325 MG TABS (HYDROCODONE-ACETAMINOPHEN) 1 by mouth two times a day by mouth qid prn  #120 x 1   Entered by:   Lanier Prude, CMA(AAMA)   Authorized by:   Tresa Garter MD   Signed by:   Lanier Prude, CMA(AAMA) on 09/27/2009   Method used:   Telephoned to ...       [facility] [address] [phone]* (retail)       [address]       [address]       [place], [place]  [address]       Ph: [phone]       Fax: [phone]   RxID:   [id number]

## 2010-04-13 NOTE — Assessment & Plan Note (Signed)
Summary: 3 MOS F/U / # CD   Vital Signs:  Patient profile:   60 year old male Height:      67 inches Weight:      193 pounds BMI:     30.34 O2 Sat:      95 % on Room air Temp:     98.3 degrees F oral Pulse rate:   89 / minute Pulse rhythm:   regular Resp:     16 per minute BP sitting:   136 / 80  (left arm) Cuff size:   regular  Vitals Entered By: Lanier Prude, CMA(AAMA) (October 28, 2009 11:20 AM)  O2 Flow:  Room air CC: 3 mo f/u Is Patient Diabetic? No   CC:  3 mo f/u.  History of Present Illness: The patient presents for a follow up of LBP, ED, OA Loosing wt on diet   Current Medications (verified): 1)  Flexeril 10 Mg Tabs (Cyclobenzaprine Hcl) .... Take 1 Tablet By Mouth Three Times A Day As Needed Spasm 2)  Flomax 0.4 Mg Cp24 (Tamsulosin Hcl) .Marland Kitchen.. 1 Once Daily Po 3)  Celebrex 200 Mg  Caps (Celecoxib) .Marland Kitchen.. 1 Two Times A Day Pc 4)  Base D Polyethylene Glycol   Powd (Polyethylene Glycol 4500) .Marland KitchenMarland KitchenMarland Kitchen 17 G By Mouth Once Daily As Needed Constipation 5)  Amlodipine Besylate 5 Mg  Tabs (Amlodipine Besylate) .Marland Kitchen.. 1 By Mouth Every Day 6)  Vitamin D3 1000 Unit  Tabs (Cholecalciferol) .Marland Kitchen.. 1 Qd 7)  Viagra 100 Mg Tabs (Sildenafil Citrate) .Marland Kitchen.. 1 By Mouth Once Daily Prn 8)  Proventil Hfa 108 (90 Base) Mcg/act Aers (Albuterol Sulfate) .... 2 Inh Qid As Needed Wheezing 9)  Hydrocodone-Acetaminophen 10-325 Mg Tabs (Hydrocodone-Acetaminophen) .Marland Kitchen.. 1 By Mouth Two Times A Day By Mouth Qid Prn  Allergies (verified): No Known Drug Allergies  Past History:  Past Medical History: Last updated: 06/10/2008 Low back pain Benign prostatic hypertrophy COPD ED Hyperlipidemia  Social History: Last updated: 03/31/2008 Occupation: on disability Single widower 2008, has a new GF 2010 Current Smoker Alcohol use-yes beer 6 a day Started to exercise 2009  Review of Systems  The patient denies chest pain, prolonged cough, and abdominal pain.    Physical Exam  General:  NAD obese.    Nose:  External nasal examination shows no deformity or inflammation. Nasal mucosa are pink and moist without lesions or exudates. Mouth:  Oral mucosa and oropharynx without lesions or exudates.  Teeth in good repair. Neck:  No deformities, masses, or tenderness noted. Heart:  Normal rate and regular rhythm. S1 and S2 normal without gallop, murmur, click, rub or other extra sounds. Abdomen:  Bowel sounds positive,abdomen soft and non-tender without masses, organomegaly or hernias noted. Large abdomen Msk:  No deformity or scoliosis noted of thoracic or lumbar spine.  Lumbar-sacral spine is tender to palpation over paraspinal muscles and painfull with the ROM  Neurologic:  No cranial nerve deficits noted. Station and gait are normal. Plantar reflexes are down-going bilaterally. DTRs are symmetrical throughout. Sensory, motor and coordinative functions appear intact.   Impression & Recommendations:  Problem # 1:  LOW BACK PAIN (ICD-724.2) Assessment Unchanged  His updated medication list for this problem includes:    Flexeril 10 Mg Tabs (Cyclobenzaprine hcl) .Marland Kitchen... Take 1 tablet by mouth three times a day as needed spasm    Celebrex 200 Mg Caps (Celecoxib) .Marland Kitchen... 1 two times a day pc    Hydrocodone-acetaminophen 10-325 Mg Tabs (Hydrocodone-acetaminophen) .Marland Kitchen... 1 by mouth two  times a day by mouth qid prn  Problem # 2:  COPD (ICD-496) Assessment: Improved  His updated medication list for this problem includes:    Proventil Hfa 108 (90 Base) Mcg/act Aers (Albuterol sulfate) .Marland Kitchen... 2 inh qid as needed wheezing  Problem # 3:  ERECTILE DYSFUNCTION (ICD-607.84) Assessment: Unchanged  His updated medication list for this problem includes:    Viagra 100 Mg Tabs (Sildenafil citrate) .Marland Kitchen... 1 by mouth once daily prn  Problem # 4:  TOBACCO USE DISORDER/SMOKER-SMOKING CESSATION DISCUSSED (ICD-305.1) Assessment: Unchanged  Encouraged smoking cessation and discussed different methods for smoking  cessation.   Complete Medication List: 1)  Flexeril 10 Mg Tabs (Cyclobenzaprine hcl) .... Take 1 tablet by mouth three times a day as needed spasm 2)  Flomax 0.4 Mg Cp24 (Tamsulosin hcl) .Marland Kitchen.. 1 once daily po 3)  Celebrex 200 Mg Caps (Celecoxib) .Marland Kitchen.. 1 two times a day pc 4)  Base D Polyethylene Glycol Powd (Polyethylene glycol 4500) .Marland KitchenMarland KitchenMarland Kitchen 17 g by mouth once daily as needed constipation 5)  Amlodipine Besylate 5 Mg Tabs (Amlodipine besylate) .Marland Kitchen.. 1 by mouth every day 6)  Vitamin D3 1000 Unit Tabs (Cholecalciferol) .Marland Kitchen.. 1 qd 7)  Viagra 100 Mg Tabs (Sildenafil citrate) .Marland Kitchen.. 1 by mouth once daily prn 8)  Proventil Hfa 108 (90 Base) Mcg/act Aers (Albuterol sulfate) .... 2 inh qid as needed wheezing 9)  Hydrocodone-acetaminophen 10-325 Mg Tabs (Hydrocodone-acetaminophen) .Marland Kitchen.. 1 by mouth two times a day by mouth qid prn  Patient Instructions: 1)  Please schedule a follow-up appointment in 3 months well w/labs. Prescriptions: HYDROCODONE-ACETAMINOPHEN 10-325 MG TABS (HYDROCODONE-ACETAMINOPHEN) 1 by mouth two times a day by mouth qid prn  #120 x 2   Entered and Authorized by:   Tresa Garter MD   Signed by:   Tresa Garter MD on 10/28/2009   Method used:   Print then Give to Patient   RxID:   [id number] VIAGRA 100 MG TABS (SILDENAFIL CITRATE) 1 by mouth once daily prn  #36 x 3   Entered and Authorized by:   Tresa Garter MD   Signed by:   Tresa Garter MD on 10/28/2009   Method used:   Print then Give to Patient   RxID:   [id number] PROVENTIL HFA 108 (90 BASE) MCG/ACT AERS (ALBUTEROL SULFATE) 2 inh qid as needed wheezing  #3 x 3   Entered and Authorized by:   Tresa Garter MD   Signed by:   Tresa Garter MD on 10/28/2009   Method used:   Print then Give to Patient   RxID:   [id number] AMLODIPINE BESYLATE 5 MG  TABS (AMLODIPINE BESYLATE) 1 by mouth every day  #90 x 3   Entered and Authorized by:   Tresa Garter MD   Signed by:   Tresa Garter MD on 10/28/2009   Method used:   Print then Give to Patient   RxID:   [id number] CELEBREX 200 MG  CAPS (CELECOXIB) 1 two times a day pc  #90 x 3   Entered and Authorized by:   Tresa Garter MD   Signed by:   Tresa Garter MD on 10/28/2009   Method used:   Print then Give to Patient   RxID:   [id number] FLOMAX 0.4 MG CP24 (TAMSULOSIN HCL) 1 once daily po  #90 x 3   Entered and Authorized by:   Tresa Garter MD   Signed by:   Georgina Quint Plotnikov  MD on 10/28/2009   Method used:   Print then Give to Patient   RxID:   [id number] FLEXERIL 10 MG TABS (CYCLOBENZAPRINE HCL) Take 1 tablet by mouth three times a day as needed spasm  #270 x 1   Entered and Authorized by:   Tresa Garter MD   Signed by:   Tresa Garter MD on 10/28/2009   Method used:   Print then Give to Patient   RxID:   [id number]

## 2010-04-13 NOTE — Progress Notes (Signed)
Summary: Clarify RX  Phone Note From Pharmacy   Summary of Call: [facility] needs clarification on pain prescription. Please verify if patient should take two times a day or four times a day. Initial call taken by: Lucious Groves,  April 01, 2009 11:45 AM  Follow-up for Phone Call        pharmacy rtc -----per Dr. Posey Rea qid ---per Alvy Beal  pharmacy is aware .Harland German Follow-up by: Tora Perches,  April 01, 2009 12:56 PM

## 2010-04-13 NOTE — Assessment & Plan Note (Signed)
Summary: 3 MTH FU---STC   Vital Signs:  Patient profile:   60 year old male Height:      67 inches Weight:      209 pounds BMI:     32.85 O2 Sat:      98 % on Room air Temp:     98.2 degrees F oral Pulse rate:   99 / minute BP sitting:   148 / 88  (left arm) Cuff size:   large  Vitals Entered By: Bill Salinas CMA (July 08, 2009 8:55 AM)  O2 Flow:  Room air CC: FOllow-up visit, pt is due for an office visit and has never had a colonoscopy/ ab   CC:  FOllow-up visit and pt is due for an office visit and has never had a colonoscopy/ ab.  History of Present Illness: The patient presents for a follow up of back pain, anxiety, depression and headaches.   Current Medications (verified): 1)  Flexeril 10 Mg Tabs (Cyclobenzaprine Hcl) .... Take 1 Tablet By Mouth Three Times A Day As Needed Spasm 2)  Flomax 0.4 Mg Cp24 (Tamsulosin Hcl) .Marland Kitchen.. 1 Once Daily Po 3)  Celebrex 200 Mg  Caps (Celecoxib) .Marland Kitchen.. 1 Two Times A Day Pc 4)  Base D Polyethylene Glycol   Powd (Polyethylene Glycol 4500) .Marland KitchenMarland KitchenMarland Kitchen 17 G By Mouth Once Daily As Needed Constipation 5)  Amlodipine Besylate 5 Mg  Tabs (Amlodipine Besylate) .Marland Kitchen.. 1 By Mouth Every Day 6)  Vitamin D3 1000 Unit  Tabs (Cholecalciferol) .Marland Kitchen.. 1 Qd 7)  Viagra 100 Mg Tabs (Sildenafil Citrate) .Marland Kitchen.. 1 By Mouth Once Daily Prn 8)  Proventil Hfa 108 (90 Base) Mcg/act Aers (Albuterol Sulfate) .... 2 Inh Qid As Needed Wheezing 9)  Hydrocodone-Acetaminophen 10-325 Mg Tabs (Hydrocodone-Acetaminophen) .Marland Kitchen.. 1 By Mouth Two Times A Day By Mouth Qid Prn  Allergies (verified): No Known Drug Allergies  Past History:  Past Medical History: Last updated: 06/10/2008 Low back pain Benign prostatic hypertrophy COPD ED Hyperlipidemia  Social History: Last updated: 03/31/2008 Occupation: on disability Single widower 2008, has a new GF 2010 Current Smoker Alcohol use-yes beer 6 a day Started to exercise 2009  Review of Systems       The patient complains of weight  gain and dyspnea on exertion.  The patient denies chest pain and abdominal pain.    Physical Exam  General:  NAD obese.   Ears:  External ear exam shows no significant lesions or deformities.  Otoscopic examination reveals clear canals, tympanic membranes are intact bilaterally without bulging, retraction, inflammation or discharge. Hearing is grossly normal bilaterally. Mouth:  Oral mucosa and oropharynx without lesions or exudates.  Teeth in good repair. Neck:  No deformities, masses, or tenderness noted. Lungs:  Normal respiratory effort, chest expands symmetrically. Lungs are clear to auscultation, no crackles or wheezes. Heart:  Normal rate and regular rhythm. S1 and S2 normal without gallop, murmur, click, rub or other extra sounds. Abdomen:  Bowel sounds positive,abdomen soft and non-tender without masses, organomegaly or hernias noted. Large abdomen Msk:  No deformity or scoliosis noted of thoracic or lumbar spine.  Lumbar-sacral spine is tender to palpation over paraspinal muscles and painfull with the ROM  Neurologic:  No cranial nerve deficits noted. Station and gait are normal. Plantar reflexes are down-going bilaterally. DTRs are symmetrical throughout. Sensory, motor and coordinative functions appear intact. Skin:  Intact without suspicious lesions or rashes Psych:  Cognition and judgment appear intact. Alert and cooperative with normal attention span and concentration. No  apparent delusions, illusions, hallucinations   Impression & Recommendations:  Problem # 1:  LOW BACK PAIN (ICD-724.2) Assessment Unchanged  His updated medication list for this problem includes:    Flexeril 10 Mg Tabs (Cyclobenzaprine hcl) .Marland Kitchen... Take 1 tablet by mouth three times a day as needed spasm    Celebrex 200 Mg Caps (Celecoxib) .Marland Kitchen... 1 two times a day pc    Hydrocodone-acetaminophen 10-325 Mg Tabs (Hydrocodone-acetaminophen) .Marland Kitchen... 1 by mouth two times a day by mouth qid prn  Problem # 2:  WEIGHT  GAIN (ICD-783.1) Assessment: Deteriorated See "Patient Instructions".   Problem # 3:  ERECTILE DYSFUNCTION (ICD-607.84) Assessment: Unchanged  His updated medication list for this problem includes:    Viagra 100 Mg Tabs (Sildenafil citrate) .Marland Kitchen... 1 by mouth once daily prn  Problem # 4:  BENIGN PROSTATIC HYPERTROPHY (ICD-600.00) Assessment: Unchanged  His updated medication list for this problem includes:    Flomax 0.4 Mg Cp24 (Tamsulosin hcl) .Marland Kitchen... 1 once daily po  Problem # 5:  ELEVATED BP (ICD-796.2) Assessment: Deteriorated  His updated medication list for this problem includes:    Amlodipine Besylate 5 Mg Tabs (Amlodipine besylate) .Marland Kitchen... 1 by mouth every day  Complete Medication List: 1)  Flexeril 10 Mg Tabs (Cyclobenzaprine hcl) .... Take 1 tablet by mouth three times a day as needed spasm 2)  Flomax 0.4 Mg Cp24 (Tamsulosin hcl) .Marland Kitchen.. 1 once daily po 3)  Celebrex 200 Mg Caps (Celecoxib) .Marland Kitchen.. 1 two times a day pc 4)  Base D Polyethylene Glycol Powd (Polyethylene glycol 4500) .Marland KitchenMarland KitchenMarland Kitchen 17 g by mouth once daily as needed constipation 5)  Amlodipine Besylate 5 Mg Tabs (Amlodipine besylate) .Marland Kitchen.. 1 by mouth every day 6)  Vitamin D3 1000 Unit Tabs (Cholecalciferol) .Marland Kitchen.. 1 qd 7)  Viagra 100 Mg Tabs (Sildenafil citrate) .Marland Kitchen.. 1 by mouth once daily prn 8)  Proventil Hfa 108 (90 Base) Mcg/act Aers (Albuterol sulfate) .... 2 inh qid as needed wheezing 9)  Hydrocodone-acetaminophen 10-325 Mg Tabs (Hydrocodone-acetaminophen) .Marland Kitchen.. 1 by mouth two times a day by mouth qid prn  Patient Instructions: 1)  Please schedule a follow-up appointment in 3 months. 2)  Use stretching and balance exercises that I have provided (15 min. or longer every day)  3)  BMP prior to visit, ICD-9: 4)  Hepatic Panel prior to visit, ICD-9:401.1 5)  TSH prior to visit, ICD-9: 6)  You need to lose weight. Consider a lower calorie diet and regular exercise.  7)  It is important that you exercise regularly at least 20  minutes 5 times a week. If you develop chest pain, have severe difficulty breathing, or feel very tired , stop exercising immediately and seek medical attention.

## 2010-04-14 ENCOUNTER — Ambulatory Visit: Admit: 2010-04-14 | Payer: Self-pay | Admitting: Internal Medicine

## 2010-04-14 ENCOUNTER — Ambulatory Visit: Payer: Self-pay | Admitting: Internal Medicine

## 2010-05-02 ENCOUNTER — Ambulatory Visit (INDEPENDENT_AMBULATORY_CARE_PROVIDER_SITE_OTHER): Payer: Medicare Other | Admitting: Internal Medicine

## 2010-05-02 ENCOUNTER — Encounter: Payer: Self-pay | Admitting: Internal Medicine

## 2010-05-02 DIAGNOSIS — J449 Chronic obstructive pulmonary disease, unspecified: Secondary | ICD-10-CM

## 2010-05-02 DIAGNOSIS — M545 Low back pain, unspecified: Secondary | ICD-10-CM

## 2010-05-02 DIAGNOSIS — J4489 Other specified chronic obstructive pulmonary disease: Secondary | ICD-10-CM

## 2010-05-02 DIAGNOSIS — N529 Male erectile dysfunction, unspecified: Secondary | ICD-10-CM

## 2010-05-02 DIAGNOSIS — R030 Elevated blood-pressure reading, without diagnosis of hypertension: Secondary | ICD-10-CM

## 2010-05-18 NOTE — Assessment & Plan Note (Signed)
Summary: 3 mth fu/ stc   Vital Signs:  Patient profile:   60 year old male Height:      67 inches Weight:      196 pounds BMI:     30.81 Temp:     98.6 degrees F oral Pulse rate:   84 / minute Pulse rhythm:   regular Resp:     16 per minute BP sitting:   118 / 80  (left arm) Cuff size:   large  Vitals Entered By: Lanier Prude, CMA(AAMA) (May 02, 2010 3:46 PM) CC: 3 mo f/u  Is Patient Diabetic? No   CC:  3 mo f/u .  History of Present Illness: The patient presents for a follow up of back pain, anxiety, depression.  Current Medications (verified): 1)  Flexeril 10 Mg Tabs (Cyclobenzaprine Hcl) .... Take 1 Tablet By Mouth Three Times A Day As Needed Spasm 2)  Flomax 0.4 Mg Cp24 (Tamsulosin Hcl) .Marland Kitchen.. 1 Once Daily Po 3)  Celebrex 200 Mg  Caps (Celecoxib) .Marland Kitchen.. 1 Two Times A Day Pc 4)  Base D Polyethylene Glycol   Powd (Polyethylene Glycol 4500) .Marland KitchenMarland KitchenMarland Kitchen 17 G By Mouth Once Daily As Needed Constipation 5)  Amlodipine Besylate 5 Mg  Tabs (Amlodipine Besylate) .Marland Kitchen.. 1 By Mouth Every Day 6)  Vitamin D3 1000 Unit  Tabs (Cholecalciferol) .Marland Kitchen.. 1 Qd 7)  Viagra 100 Mg Tabs (Sildenafil Citrate) .Marland Kitchen.. 1 By Mouth Once Daily Prn 8)  Proventil Hfa 108 (90 Base) Mcg/act Aers (Albuterol Sulfate) .... 2 Inh Qid As Needed Wheezing 9)  Hydrocodone-Acetaminophen 10-325 Mg Tabs (Hydrocodone-Acetaminophen) .Marland Kitchen.. 1 By Mouth Three To Four Times Daily As Needed  Allergies (verified): No Known Drug Allergies  Past History:  Past Medical History: Last updated: 01/13/2010 Low back pain Benign prostatic hypertrophy COPD ED Hyperlipidemia Osteoporosis Microscopic hematuria s/p Urol w/up  Social History: Last updated: 01/13/2010 Occupation: on disability Single widower 2008, has a new GF 2010 Current Smoker Alcohol use-yes beer 6 a day Started to exercise 2009 Drug use-no  Review of Systems  The patient denies fever, decreased hearing, dyspnea on exertion, abdominal pain, and severe  indigestion/heartburn.    Physical Exam  General:  NAD obese.   Ears:  External ear exam shows no significant lesions or deformities.  Otoscopic examination reveals clear canals, tympanic membranes are intact bilaterally without bulging, retraction, inflammation or discharge. Hearing is grossly normal bilaterally. Nose:  External nasal examination shows no deformity or inflammation. Nasal mucosa are pink and moist without lesions or exudates. Mouth:  Oral mucosa and oropharynx without lesions or exudates.  Teeth in good repair. Neck:  No deformities, masses, or tenderness noted. Lungs:  Normal respiratory effort, chest expands symmetrically. Lungs are clear to auscultation, no crackles or wheezes. Heart:  Normal rate and regular rhythm. S1 and S2 normal without gallop, murmur, click, rub or other extra sounds. Abdomen:  Bowel sounds positive,abdomen soft and non-tender without masses, organomegaly or hernias noted. Large abdomen Msk:  No deformity or scoliosis noted of thoracic or lumbar spine.  Lumbar-sacral spine is tender to palpation over paraspinal muscles and painfull with the ROM  Neurologic:  No cranial nerve deficits noted. Station and gait are normal. Plantar reflexes are down-going bilaterally. DTRs are symmetrical throughout. Sensory, motor and coordinative functions appear intact. Skin:  Intact without suspicious lesions or rashes Psych:  Cognition and judgment appear intact. Alert and cooperative with normal attention span and concentration. No apparent delusions, illusions, hallucinations   Impression & Recommendations:  Problem #  1:  LOW BACK PAIN (ICD-724.2) Assessment Unchanged  His updated medication list for this problem includes:    Flexeril 10 Mg Tabs (Cyclobenzaprine hcl) .Marland Kitchen... Take 1 tablet by mouth three times a day as needed spasm    Celebrex 200 Mg Caps (Celecoxib) .Marland Kitchen... 1 two times a day pc    Hydrocodone-acetaminophen 10-325 Mg Tabs (Hydrocodone-acetaminophen)  .Marland Kitchen... 1 by mouth three to four times daily as needed  Problem # 2:  ELEVATED BP (ICD-796.2) Assessment: Improved  His updated medication list for this problem includes:    Amlodipine Besylate 5 Mg Tabs (Amlodipine besylate) .Marland Kitchen... 1 by mouth every day  Problem # 3:  ERECTILE DYSFUNCTION (ICD-607.84) Assessment: Unchanged  His updated medication list for this problem includes:    Viagra 100 Mg Tabs (Sildenafil citrate) .Marland Kitchen... 1 by mouth once daily prn  Problem # 4:  COPD (ICD-496) Assessment: Unchanged  His updated medication list for this problem includes:    Proventil Hfa 108 (90 Base) Mcg/act Aers (Albuterol sulfate) .Marland Kitchen... 2 inh qid as needed wheezing  Complete Medication List: 1)  Flexeril 10 Mg Tabs (Cyclobenzaprine hcl) .... Take 1 tablet by mouth three times a day as needed spasm 2)  Flomax 0.4 Mg Cp24 (Tamsulosin hcl) .Marland Kitchen.. 1 once daily po 3)  Celebrex 200 Mg Caps (Celecoxib) .Marland Kitchen.. 1 two times a day pc 4)  Base D Polyethylene Glycol Powd (Polyethylene glycol 4500) .Marland KitchenMarland KitchenMarland Kitchen 17 g by mouth once daily as needed constipation 5)  Amlodipine Besylate 5 Mg Tabs (Amlodipine besylate) .Marland Kitchen.. 1 by mouth every day 6)  Vitamin D3 1000 Unit Tabs (Cholecalciferol) .Marland Kitchen.. 1 qd 7)  Viagra 100 Mg Tabs (Sildenafil citrate) .Marland Kitchen.. 1 by mouth once daily prn 8)  Proventil Hfa 108 (90 Base) Mcg/act Aers (Albuterol sulfate) .... 2 inh qid as needed wheezing 9)  Hydrocodone-acetaminophen 10-325 Mg Tabs (Hydrocodone-acetaminophen) .Marland Kitchen.. 1 by mouth three to four times daily as needed  Patient Instructions: 1)  Please schedule a follow-up appointment in 3 months well w/labs. Prescriptions: HYDROCODONE-ACETAMINOPHEN 10-325 MG TABS (HYDROCODONE-ACETAMINOPHEN) 1 by mouth three to four times daily as needed  #120 x 2   Entered and Authorized by:   Tresa Garter MD   Signed by:   Tresa Garter MD on 05/02/2010   Method used:   Print then Give to Patient   RxID:   [id number] VIAGRA 100 MG TABS  (SILDENAFIL CITRATE) 1 by mouth once daily prn  #12 x 6   Entered and Authorized by:   Tresa Garter MD   Signed by:   Tresa Garter MD on 05/02/2010   Method used:   Print then Give to Patient   RxID:   [id number] AMLODIPINE BESYLATE 5 MG  TABS (AMLODIPINE BESYLATE) 1 by mouth every day  #30 x 11   Entered and Authorized by:   Tresa Garter MD   Signed by:   Tresa Garter MD on 05/02/2010   Method used:   Print then Give to Patient   RxID:   [id number] BASE D POLYETHYLENE GLYCOL   POWD (POLYETHYLENE GLYCOL 4500) 17 g by mouth once daily as needed constipation  #qs x 1 mo x 11   Entered and Authorized by:   Tresa Garter MD   Signed by:   Tresa Garter MD on 05/02/2010   Method used:   Print then Give to Patient   RxID:   [id number] CELEBREX 200 MG  CAPS (CELECOXIB) 1 two times a day  pc  #30 x 11   Entered and Authorized by:   Tresa Garter MD   Signed by:   Tresa Garter MD on 05/02/2010   Method used:   Print then Give to Patient   RxID:   [id number] FLOMAX 0.4 MG CP24 (TAMSULOSIN HCL) 1 once daily po  #30 x 11   Entered and Authorized by:   Tresa Garter MD   Signed by:   Tresa Garter MD on 05/02/2010   Method used:   Print then Give to Patient   RxID:   [id number] FLEXERIL 10 MG TABS (CYCLOBENZAPRINE HCL) Take 1 tablet by mouth three times a day as needed spasm  #30 x 11   Entered and Authorized by:   Tresa Garter MD   Signed by:   Tresa Garter MD on 05/02/2010   Method used:   Print then Give to Patient   RxID:   [id number]    Orders Added: 1)  Est. Patient Level IV [56387]

## 2010-06-27 ENCOUNTER — Telehealth: Payer: Self-pay | Admitting: *Deleted

## 2010-06-27 MED ORDER — TADALAFIL 20 MG PO TABS: 20.0000 mg | ORAL_TABLET | Freq: Every day | ORAL | Status: DC | PRN

## 2010-06-27 NOTE — Telephone Encounter (Signed)
ok 

## 2010-06-27 NOTE — Telephone Encounter (Signed)
rec a fax req change from Viagra to Cialis. Please advise?

## 2010-06-29 ENCOUNTER — Other Ambulatory Visit: Payer: Self-pay | Admitting: Internal Medicine

## 2010-06-29 MED ORDER — TADALAFIL 20 MG PO TABS: 20.0000 mg | ORAL_TABLET | Freq: Every day | ORAL | Status: DC | PRN

## 2010-07-26 ENCOUNTER — Other Ambulatory Visit (INDEPENDENT_AMBULATORY_CARE_PROVIDER_SITE_OTHER): Payer: Medicare Other | Admitting: Internal Medicine

## 2010-07-26 ENCOUNTER — Other Ambulatory Visit: Payer: Self-pay | Admitting: Internal Medicine

## 2010-07-26 ENCOUNTER — Other Ambulatory Visit (INDEPENDENT_AMBULATORY_CARE_PROVIDER_SITE_OTHER): Payer: Medicare Other

## 2010-07-26 DIAGNOSIS — Z0389 Encounter for observation for other suspected diseases and conditions ruled out: Secondary | ICD-10-CM

## 2010-07-26 DIAGNOSIS — Z0000 Encounter for general adult medical examination without abnormal findings: Secondary | ICD-10-CM

## 2010-07-26 DIAGNOSIS — E785 Hyperlipidemia, unspecified: Secondary | ICD-10-CM

## 2010-07-26 LAB — CBC WITH DIFFERENTIAL/PLATELET
Basophils Absolute: 0.1 10*3/uL (ref 0.0–0.1)
Hemoglobin: 13.9 g/dL (ref 13.0–17.0)
Lymphocytes Relative: 46.1 % — ABNORMAL HIGH (ref 12.0–46.0)
Monocytes Relative: 12.8 % — ABNORMAL HIGH (ref 3.0–12.0)
Neutro Abs: 1.8 10*3/uL (ref 1.4–7.7)
Neutrophils Relative %: 34.8 % — ABNORMAL LOW (ref 43.0–77.0)
RDW: 13.4 % (ref 11.5–14.6)

## 2010-07-26 LAB — LIPID PANEL
HDL: 40.6 mg/dL (ref >39.00)
LDL Cholesterol: 104 mg/dL — ABNORMAL HIGH (ref 0–99)
Total CHOL/HDL Ratio: 4
Triglycerides: 87 mg/dL (ref 0.0–149.0)

## 2010-07-26 LAB — URINALYSIS, ROUTINE W REFLEX MICROSCOPIC
Ketones, ur: NEGATIVE
Urine Glucose: NEGATIVE
Urobilinogen, UA: 0.2 (ref 0.0–1.0)

## 2010-07-26 LAB — TSH: TSH: 0.79 u[IU]/mL (ref 0.35–5.50)

## 2010-07-26 LAB — HEPATIC FUNCTION PANEL
AST: 21 U/L (ref 0–37)
Albumin: 3.6 g/dL (ref 3.5–5.2)

## 2010-07-26 LAB — BASIC METABOLIC PANEL
CO2: 27 mEq/L (ref 19–32)
Chloride: 106 mEq/L (ref 96–112)
Sodium: 141 mEq/L (ref 135–145)

## 2010-07-26 LAB — PSA: PSA: 1.33 ng/mL (ref 0.10–4.00)

## 2010-07-28 NOTE — Letter (Signed)
Jul 31, 2006    Mr. Cedarius Kersh  [address]  [place], [place] [address]   RE:  CANNAN, BEECK  MRN:  [id number]  /  DOB:  [birth year]   Dear Mr. Shevlin,   It was brought to my attention that you were rude to our office manager  today.  If this type of behavior towards any of our staff members  recurs, I will not be able to remain your physician.    Sincerely,      Georgina Quint. Plotnikov, MD  Electronically Signed    AVP/MedQ  DD: 07/31/2006  DT: 07/31/2006  Job #: [id number]

## 2010-08-01 ENCOUNTER — Encounter: Payer: Self-pay | Admitting: Internal Medicine

## 2010-08-02 ENCOUNTER — Ambulatory Visit (INDEPENDENT_AMBULATORY_CARE_PROVIDER_SITE_OTHER): Payer: Medicare Other | Admitting: Internal Medicine

## 2010-08-02 ENCOUNTER — Encounter: Payer: Self-pay | Admitting: Internal Medicine

## 2010-08-02 VITALS — BP 130/72 | HR 89 | Temp 98.4°F | Wt 195.5 lb

## 2010-08-02 DIAGNOSIS — J4489 Other specified chronic obstructive pulmonary disease: Secondary | ICD-10-CM

## 2010-08-02 DIAGNOSIS — J449 Chronic obstructive pulmonary disease, unspecified: Secondary | ICD-10-CM

## 2010-08-02 DIAGNOSIS — Z0000 Encounter for general adult medical examination without abnormal findings: Secondary | ICD-10-CM

## 2010-08-02 DIAGNOSIS — N400 Enlarged prostate without lower urinary tract symptoms: Secondary | ICD-10-CM

## 2010-08-02 DIAGNOSIS — Z23 Encounter for immunization: Secondary | ICD-10-CM

## 2010-08-02 DIAGNOSIS — E785 Hyperlipidemia, unspecified: Secondary | ICD-10-CM

## 2010-08-02 DIAGNOSIS — M545 Low back pain, unspecified: Secondary | ICD-10-CM

## 2010-08-02 DIAGNOSIS — Z136 Encounter for screening for cardiovascular disorders: Secondary | ICD-10-CM

## 2010-08-02 MED ORDER — SILDENAFIL CITRATE 100 MG PO TABS: 100.0000 mg | ORAL_TABLET | Freq: Every day | ORAL | Status: DC | PRN

## 2010-08-02 MED ORDER — HYDROCODONE-ACETAMINOPHEN 10-325 MG PO TABS: 1.0000 | ORAL_TABLET | Freq: Four times a day (QID) | ORAL | Status: DC | PRN

## 2010-08-02 MED ORDER — TAMSULOSIN HCL 0.4 MG PO CAPS: 0.4000 mg | ORAL_CAPSULE | Freq: Every day | ORAL | Status: DC

## 2010-08-02 MED ORDER — AMLODIPINE BESYLATE 5 MG PO TABS: 5.0000 mg | ORAL_TABLET | Freq: Every day | ORAL | Status: DC

## 2010-08-02 NOTE — Progress Notes (Signed)
Subjective:    Patient ID: Gordon Lucas, male    DOB: 25-Jul-1950, 60 y.o.   MRN: [id number]  HPI The patient is here for a wellness exam. The patient has been doing well overall without major physical or psychological issues going on lately.   The patient is here to follow up on chronic depression, anxiety, HTN and chronic moderate LBP symptoms controlled with medicines, diet and exercise.   Review of Systems  Constitutional: Negative for appetite change, fatigue and unexpected weight change.  HENT: Negative for nosebleeds, congestion, sore throat, sneezing, trouble swallowing and neck pain.   Eyes: Negative for itching and visual disturbance.  Respiratory: Negative for cough.   Cardiovascular: Negative for chest pain, palpitations and leg swelling.  Gastrointestinal: Negative for nausea, diarrhea, blood in stool and abdominal distention.  Genitourinary: Negative for frequency and hematuria.  Musculoskeletal: Positive for back pain. Negative for joint swelling and gait problem.  Skin: Negative for rash.  Neurological: Negative for dizziness, tremors, speech difficulty and weakness.  Psychiatric/Behavioral: Negative for sleep disturbance, dysphoric mood and agitation. The patient is not nervous/anxious.    Wt Readings from Last 3 Encounters:  08/02/10 195 lb 8 oz (88.678 kg)  05/02/10 196 lb (88.905 kg)  01/13/10 195 lb (88.451 kg)   BP Readings from Last 3 Encounters:  08/02/10 130/72  05/02/10 118/80  01/13/10 110/72       Objective:   Physical Exam  Constitutional: He is oriented to person, place, and time. He appears well-developed and well-nourished. No distress.  HENT:  Head: Normocephalic and atraumatic.  Right Ear: External ear normal.  Left Ear: External ear normal.  Nose: Nose normal.  Mouth/Throat: Oropharynx is clear and moist. No oropharyngeal exudate.  Eyes: Conjunctivae and EOM are normal. Pupils are equal, round, and reactive to light. Right eye  exhibits no discharge. Left eye exhibits no discharge. No scleral icterus.  Neck: Normal range of motion. Neck supple. No JVD present. No tracheal deviation present. No thyromegaly present.  Cardiovascular: Normal rate, regular rhythm, normal heart sounds and intact distal pulses.  Exam reveals no gallop and no friction rub.   No murmur heard. Pulmonary/Chest: Effort normal and breath sounds normal. No stridor. No respiratory distress. He has no wheezes. He has no rales. He exhibits no tenderness.  Abdominal: Soft. Bowel sounds are normal. He exhibits no distension and no mass. There is no tenderness. There is no rebound and no guarding.  Genitourinary: Rectum normal and penis normal. Guaiac negative stool. No penile tenderness.       Prostate 1+  Musculoskeletal: Normal range of motion. He exhibits no edema and no tenderness.  Lymphadenopathy:    He has no cervical adenopathy.  Neurological: He is alert and oriented to person, place, and time. He has normal reflexes. No cranial nerve deficit. He exhibits normal muscle tone. Coordination normal.  Skin: Skin is warm and dry. No rash noted. He is not diaphoretic. No erythema. No pallor.  Psychiatric: He has a normal mood and affect. His behavior is normal. Judgment and thought content normal.        Lab Results  Component Value Date   WBC 5.3 07/26/2010   HGB 13.9 07/26/2010   HCT 42.5 07/26/2010   PLT 188.0 07/26/2010   CHOL 162 07/26/2010   TRIG 87.0 07/26/2010   HDL 40.60 07/26/2010   LDLDIRECT 163.3 06/07/2008   ALT 25 07/26/2010   AST 21 07/26/2010   NA 141 07/26/2010   K 4.7 07/26/2010  CL 106 07/26/2010   CREATININE 0.8 07/26/2010   BUN 11 07/26/2010   CO2 27 07/26/2010   TSH 0.79 07/26/2010   PSA 1.33 07/26/2010   HGBA1C 5.3 06/07/2008      Assessment & Plan:  Well adult exam We discussed age appropriate health related issues, including available/recomended screening tests and vaccinations. We discussed a need for adhering to healthy  diet and exercise. Labs/EKG were reviewed/ordered. All questions were answered.    LOW BACK PAIN On Rx  HYPERLIPIDEMIA Better  COPD He needs to stop smoking  BENIGN PROSTATIC HYPERTROPHY On Rx

## 2010-08-02 NOTE — Assessment & Plan Note (Signed)
On Rx 

## 2010-08-02 NOTE — Assessment & Plan Note (Signed)
Better  

## 2010-08-02 NOTE — Assessment & Plan Note (Signed)
He needs to stop smoking 

## 2010-08-02 NOTE — Assessment & Plan Note (Signed)
We discussed age appropriate health related issues, including available/recomended screening tests and vaccinations. We discussed a need for adhering to healthy diet and exercise. Labs/EKG were reviewed/ordered. All questions were answered.   

## 2010-08-07 ENCOUNTER — Encounter: Payer: Self-pay | Admitting: Internal Medicine

## 2010-09-12 ENCOUNTER — Inpatient Hospital Stay (INDEPENDENT_AMBULATORY_CARE_PROVIDER_SITE_OTHER)
Admission: RE | Admit: 2010-09-12 | Discharge: 2010-09-12 | Disposition: A | Discharge status: Home / Self Care | Payer: Medicare Other | Source: Ambulatory Visit | Attending: Family Medicine | Admitting: Family Medicine

## 2010-09-12 DIAGNOSIS — IMO0002 Reserved for concepts with insufficient information to code with codable children: Secondary | ICD-10-CM

## 2010-10-26 ENCOUNTER — Ambulatory Visit (INDEPENDENT_AMBULATORY_CARE_PROVIDER_SITE_OTHER): Payer: Medicare Other | Admitting: Internal Medicine

## 2010-10-26 ENCOUNTER — Encounter: Payer: Self-pay | Admitting: Internal Medicine

## 2010-10-26 DIAGNOSIS — J4489 Other specified chronic obstructive pulmonary disease: Secondary | ICD-10-CM

## 2010-10-26 DIAGNOSIS — M545 Low back pain, unspecified: Secondary | ICD-10-CM

## 2010-10-26 DIAGNOSIS — N529 Male erectile dysfunction, unspecified: Secondary | ICD-10-CM

## 2010-10-26 DIAGNOSIS — R030 Elevated blood-pressure reading, without diagnosis of hypertension: Secondary | ICD-10-CM

## 2010-10-26 DIAGNOSIS — J449 Chronic obstructive pulmonary disease, unspecified: Secondary | ICD-10-CM

## 2010-10-26 DIAGNOSIS — N400 Enlarged prostate without lower urinary tract symptoms: Secondary | ICD-10-CM

## 2010-10-26 DIAGNOSIS — R635 Abnormal weight gain: Secondary | ICD-10-CM

## 2010-10-26 MED ORDER — HYDROCODONE-ACETAMINOPHEN 10-325 MG PO TABS: 1.0000 | ORAL_TABLET | Freq: Four times a day (QID) | ORAL | Status: DC | PRN

## 2010-10-26 MED ORDER — CELECOXIB 200 MG PO CAPS: 200.0000 mg | ORAL_CAPSULE | Freq: Two times a day (BID) | ORAL | Status: DC

## 2010-10-26 MED ORDER — SILDENAFIL CITRATE 100 MG PO TABS: 100.0000 mg | ORAL_TABLET | Freq: Every day | ORAL | Status: DC | PRN

## 2010-10-26 MED ORDER — TAMSULOSIN HCL 0.4 MG PO CAPS: 0.4000 mg | ORAL_CAPSULE | Freq: Every day | ORAL | Status: DC

## 2010-10-26 MED ORDER — AMLODIPINE BESYLATE 5 MG PO TABS: 5.0000 mg | ORAL_TABLET | Freq: Every day | ORAL | Status: DC

## 2010-10-26 NOTE — Progress Notes (Signed)
  Subjective:    Patient ID: Gordon Lucas, male    DOB: March 19, 1950, 60 y.o.   MRN: [id number]  HPI    The patient is here to follow up on chronic depression, ED and chronic moderate LBP symptoms controlled with medicines, diet and exercise.  Review of Systems  Constitutional: Negative for appetite change, fatigue and unexpected weight change.  HENT: Negative for nosebleeds, congestion, sore throat, sneezing, trouble swallowing and neck pain.   Eyes: Negative for itching and visual disturbance.  Respiratory: Negative for cough.   Cardiovascular: Negative for chest pain, palpitations and leg swelling.  Gastrointestinal: Negative for nausea, diarrhea, blood in stool and abdominal distention.  Genitourinary: Negative for frequency and hematuria.  Musculoskeletal: Positive for back pain. Negative for joint swelling and gait problem.  Skin: Negative for rash.  Neurological: Negative for dizziness, tremors, speech difficulty and weakness.  Psychiatric/Behavioral: Negative for sleep disturbance, dysphoric mood and agitation. The patient is not nervous/anxious.        Objective:   Physical Exam  Constitutional: He is oriented to person, place, and time. He appears well-developed.  HENT:  Mouth/Throat: Oropharynx is clear and moist.  Eyes: Conjunctivae are normal. Pupils are equal, round, and reactive to light.  Neck: Normal range of motion. No JVD present. No thyromegaly present.  Cardiovascular: Normal rate, regular rhythm, normal heart sounds and intact distal pulses.  Exam reveals no gallop and no friction rub.   No murmur heard. Pulmonary/Chest: Effort normal and breath sounds normal. No respiratory distress. He has no wheezes. He has no rales. He exhibits no tenderness.  Abdominal: Soft. Bowel sounds are normal. He exhibits no distension and no mass. There is no tenderness. There is no rebound and no guarding.  Musculoskeletal: Normal range of motion. He exhibits tenderness (LS  is tender w/ROM). He exhibits no edema.  Lymphadenopathy:    He has no cervical adenopathy.  Neurological: He is alert and oriented to person, place, and time. He has normal reflexes. No cranial nerve deficit. He exhibits normal muscle tone. Coordination normal.  Skin: Skin is warm and dry. No rash noted.  Psychiatric: He has a normal mood and affect. His behavior is normal. Judgment and thought content normal.     Lab Results  Component Value Date   WBC 5.3 07/26/2010   HGB 13.9 07/26/2010   HCT 42.5 07/26/2010   PLT 188.0 07/26/2010   CHOL 162 07/26/2010   TRIG 87.0 07/26/2010   HDL 40.60 07/26/2010   LDLDIRECT 163.3 06/07/2008   ALT 25 07/26/2010   AST 21 07/26/2010   NA 141 07/26/2010   K 4.7 07/26/2010   CL 106 07/26/2010   CREATININE 0.8 07/26/2010   BUN 11 07/26/2010   CO2 27 07/26/2010   TSH 0.79 07/26/2010   PSA 1.33 07/26/2010   HGBA1C 5.3 06/07/2008        Assessment & Plan:

## 2010-10-26 NOTE — Assessment & Plan Note (Signed)
On Rx 

## 2010-10-26 NOTE — Assessment & Plan Note (Signed)
On Flomax 

## 2010-10-26 NOTE — Assessment & Plan Note (Signed)
Wt Readings from Last 3 Encounters:  10/26/10 188 lb (85.276 kg)  08/02/10 195 lb 8 oz (88.678 kg)  05/02/10 196 lb (88.905 kg)

## 2010-10-28 ENCOUNTER — Encounter: Payer: Self-pay | Admitting: Internal Medicine

## 2011-01-29 ENCOUNTER — Encounter: Payer: Self-pay | Admitting: Internal Medicine

## 2011-01-29 ENCOUNTER — Ambulatory Visit (INDEPENDENT_AMBULATORY_CARE_PROVIDER_SITE_OTHER): Payer: Medicare Other | Admitting: Internal Medicine

## 2011-01-29 VITALS — BP 132/76 | HR 82 | Temp 98.1°F | Wt 193.0 lb

## 2011-01-29 DIAGNOSIS — M545 Low back pain: Secondary | ICD-10-CM

## 2011-01-29 DIAGNOSIS — R030 Elevated blood-pressure reading, without diagnosis of hypertension: Secondary | ICD-10-CM

## 2011-01-29 DIAGNOSIS — Z23 Encounter for immunization: Secondary | ICD-10-CM

## 2011-01-29 DIAGNOSIS — Z720 Tobacco use: Secondary | ICD-10-CM

## 2011-01-29 DIAGNOSIS — F17200 Nicotine dependence, unspecified, uncomplicated: Secondary | ICD-10-CM

## 2011-01-29 DIAGNOSIS — N529 Male erectile dysfunction, unspecified: Secondary | ICD-10-CM

## 2011-01-29 DIAGNOSIS — E785 Hyperlipidemia, unspecified: Secondary | ICD-10-CM

## 2011-01-29 MED ORDER — HYDROCODONE-ACETAMINOPHEN 10-325 MG PO TABS: 1.0000 | ORAL_TABLET | Freq: Four times a day (QID) | ORAL | Status: DC | PRN

## 2011-01-29 MED ORDER — BASE A POLYETHYLENE GLYCOL POWD: 17.0000 g | Freq: Every day | Status: DC | PRN

## 2011-01-29 MED ORDER — TAMSULOSIN HCL 0.4 MG PO CAPS: 0.4000 mg | ORAL_CAPSULE | Freq: Every day | ORAL | Status: DC

## 2011-01-29 MED ORDER — SILDENAFIL CITRATE 100 MG PO TABS: 100.0000 mg | ORAL_TABLET | Freq: Every day | ORAL | Status: DC | PRN

## 2011-01-29 MED ORDER — CELECOXIB 200 MG PO CAPS: 200.0000 mg | ORAL_CAPSULE | Freq: Two times a day (BID) | ORAL | Status: DC

## 2011-01-29 MED ORDER — AMLODIPINE BESYLATE 5 MG PO TABS: 5.0000 mg | ORAL_TABLET | Freq: Every day | ORAL | Status: DC

## 2011-01-29 MED ORDER — CYCLOBENZAPRINE HCL 10 MG PO TABS: 10.0000 mg | ORAL_TABLET | Freq: Three times a day (TID) | ORAL | Status: DC | PRN

## 2011-01-29 NOTE — Assessment & Plan Note (Signed)
Better now 

## 2011-01-29 NOTE — Assessment & Plan Note (Signed)
Continue with current prescription therapy as reflected on the Med list.  

## 2011-01-29 NOTE — Progress Notes (Signed)
  Subjective:    Patient ID: Gordon Lucas, male    DOB: [birth year], 60 y.o.   MRN: [id number]  HPI   The patient is here to follow up on chronic LBP, depression, anxiety, headaches and chronic moderate OA symptoms controlled with medicines. He gets no exercise.   Review of Systems  Constitutional: Positive for unexpected weight change. Negative for appetite change and fatigue.  HENT: Negative for nosebleeds, congestion, sore throat, sneezing, trouble swallowing and neck pain.   Eyes: Negative for itching and visual disturbance.  Respiratory: Negative for cough.   Cardiovascular: Negative for chest pain, palpitations and leg swelling.  Gastrointestinal: Negative for nausea, diarrhea, blood in stool and abdominal distention.  Genitourinary: Negative for frequency and hematuria.  Musculoskeletal: Positive for back pain and arthralgias. Negative for joint swelling and gait problem.  Skin: Negative for rash.  Neurological: Negative for dizziness, tremors, speech difficulty and weakness.  Psychiatric/Behavioral: Positive for sleep disturbance. Negative for suicidal ideas, dysphoric mood and agitation. The patient is nervous/anxious.        Objective:   Physical Exam  Constitutional: He is oriented to person, place, and time. He appears well-developed.       obese  HENT:  Mouth/Throat: Oropharynx is clear and moist.  Eyes: Conjunctivae are normal. Pupils are equal, round, and reactive to light.  Neck: Normal range of motion. No JVD present. No thyromegaly present.  Cardiovascular: Normal rate, regular rhythm, normal heart sounds and intact distal pulses.  Exam reveals no gallop and no friction rub.   No murmur heard. Pulmonary/Chest: Effort normal and breath sounds normal. No respiratory distress. He has no wheezes. He has no rales. He exhibits no tenderness.  Abdominal: Soft. Bowel sounds are normal. He exhibits no distension and no mass. There is no tenderness. There is no  rebound and no guarding.  Musculoskeletal: Normal range of motion. He exhibits tenderness (LS is stiff and tender). He exhibits no edema.  Lymphadenopathy:    He has no cervical adenopathy.  Neurological: He is alert and oriented to person, place, and time. He has normal reflexes. No cranial nerve deficit. He exhibits normal muscle tone. Coordination normal.  Skin: Skin is warm and dry. No rash noted.  Psychiatric: He has a normal mood and affect. His behavior is normal. Judgment and thought content normal.          Assessment & Plan:

## 2011-01-29 NOTE — Assessment & Plan Note (Signed)
On diet. Loose wt Wt Readings from Last 3 Encounters:  01/29/11 193 lb (87.544 kg)  10/26/10 188 lb (85.276 kg)  08/02/10 195 lb 8 oz (88.678 kg)

## 2011-01-29 NOTE — Assessment & Plan Note (Signed)
Discussed.

## 2011-02-07 ENCOUNTER — Telehealth: Payer: Self-pay | Admitting: Internal Medicine

## 2011-02-07 NOTE — Telephone Encounter (Signed)
Patient's prior Authorization has been approved for his Celebrex until November 28,2013.  Pharmacy notified and tried to contact patient but patient's telephone has changed and we don't have the correct number.  Also tried emergency contact but no answer.

## 2011-05-01 ENCOUNTER — Ambulatory Visit (INDEPENDENT_AMBULATORY_CARE_PROVIDER_SITE_OTHER): Payer: Medicare Other | Admitting: Internal Medicine

## 2011-05-01 ENCOUNTER — Encounter: Payer: Self-pay | Admitting: Internal Medicine

## 2011-05-01 DIAGNOSIS — E785 Hyperlipidemia, unspecified: Secondary | ICD-10-CM

## 2011-05-01 DIAGNOSIS — R030 Elevated blood-pressure reading, without diagnosis of hypertension: Secondary | ICD-10-CM

## 2011-05-01 DIAGNOSIS — M545 Low back pain: Secondary | ICD-10-CM

## 2011-05-01 DIAGNOSIS — J449 Chronic obstructive pulmonary disease, unspecified: Secondary | ICD-10-CM

## 2011-05-01 MED ORDER — AMLODIPINE BESYLATE 5 MG PO TABS: 5.0000 mg | ORAL_TABLET | Freq: Every day | ORAL | Status: DC

## 2011-05-01 MED ORDER — TAMSULOSIN HCL 0.4 MG PO CAPS: 0.4000 mg | ORAL_CAPSULE | Freq: Every day | ORAL | Status: DC

## 2011-05-01 MED ORDER — CYCLOBENZAPRINE HCL 10 MG PO TABS: 10.0000 mg | ORAL_TABLET | Freq: Three times a day (TID) | ORAL | Status: DC | PRN

## 2011-05-01 MED ORDER — BASE A POLYETHYLENE GLYCOL POWD: 17.0000 g | Freq: Every day | Status: DC | PRN

## 2011-05-01 MED ORDER — SILDENAFIL CITRATE 100 MG PO TABS: 100.0000 mg | ORAL_TABLET | Freq: Every day | ORAL | Status: DC | PRN

## 2011-05-01 MED ORDER — CELECOXIB 200 MG PO CAPS: 200.0000 mg | ORAL_CAPSULE | Freq: Two times a day (BID) | ORAL | Status: DC

## 2011-05-01 MED ORDER — HYDROCODONE-ACETAMINOPHEN 10-325 MG PO TABS: 1.0000 | ORAL_TABLET | Freq: Four times a day (QID) | ORAL | Status: DC | PRN

## 2011-05-01 NOTE — Progress Notes (Signed)
Patient ID: Gordon Lucas, male   DOB: [birth year], 61 y.o.   MRN: [id number]  Subjective:    Patient ID: Gordon Lucas, male    DOB: 05/05/1950, 61 y.o.   MRN: [id number]  HPI   The patient is here to follow up on chronic LBP, depression, anxiety, headaches and chronic moderate OA symptoms controlled with medicines. He gets no exercise.  Wt Readings from Last 3 Encounters:  05/01/11 192 lb (87.091 kg)  01/29/11 193 lb (87.544 kg)  10/26/10 188 lb (85.276 kg)     Review of Systems  Constitutional: Positive for unexpected weight change. Negative for appetite change and fatigue.  HENT: Negative for nosebleeds, congestion, sore throat, sneezing, trouble swallowing and neck pain.   Eyes: Negative for itching and visual disturbance.  Respiratory: Negative for cough.   Cardiovascular: Negative for chest pain, palpitations and leg swelling.  Gastrointestinal: Negative for nausea, diarrhea, blood in stool and abdominal distention.  Genitourinary: Negative for frequency and hematuria.  Musculoskeletal: Positive for back pain and arthralgias. Negative for joint swelling and gait problem.  Skin: Negative for rash.  Neurological: Negative for dizziness, tremors, speech difficulty and weakness.  Psychiatric/Behavioral: Positive for sleep disturbance. Negative for suicidal ideas, dysphoric mood and agitation. The patient is nervous/anxious.        Objective:   Physical Exam  Constitutional: He is oriented to person, place, and time. He appears well-developed.       obese  HENT:  Mouth/Throat: Oropharynx is clear and moist.  Eyes: Conjunctivae are normal. Pupils are equal, round, and reactive to light.  Neck: Normal range of motion. No JVD present. No thyromegaly present.  Cardiovascular: Normal rate, regular rhythm, normal heart sounds and intact distal pulses.  Exam reveals no gallop and no friction rub.   No murmur heard. Pulmonary/Chest: Effort normal and breath sounds  normal. No respiratory distress. He has no wheezes. He has no rales. He exhibits no tenderness.  Abdominal: Soft. Bowel sounds are normal. He exhibits no distension and no mass. There is no tenderness. There is no rebound and no guarding.  Musculoskeletal: Normal range of motion. He exhibits tenderness (LS is stiff and tender). He exhibits no edema.  Lymphadenopathy:    He has no cervical adenopathy.  Neurological: He is alert and oriented to person, place, and time. He has normal reflexes. No cranial nerve deficit. He exhibits normal muscle tone. Coordination normal.  Skin: Skin is warm and dry. No rash noted.  Psychiatric: He has a normal mood and affect. His behavior is normal. Judgment and thought content normal.          Assessment & Plan:

## 2011-05-01 NOTE — Assessment & Plan Note (Signed)
Discussed.

## 2011-05-01 NOTE — Assessment & Plan Note (Signed)
Continue with current prescription therapy as reflected on the Med list.  

## 2011-05-01 NOTE — Assessment & Plan Note (Signed)
Continue with current diet and Rx -- better

## 2011-07-30 ENCOUNTER — Encounter: Payer: Self-pay | Admitting: Internal Medicine

## 2011-07-30 ENCOUNTER — Other Ambulatory Visit (INDEPENDENT_AMBULATORY_CARE_PROVIDER_SITE_OTHER): Payer: Medicare Other

## 2011-07-30 ENCOUNTER — Ambulatory Visit (INDEPENDENT_AMBULATORY_CARE_PROVIDER_SITE_OTHER): Payer: Medicare Other | Admitting: Internal Medicine

## 2011-07-30 VITALS — BP 120/70 | HR 80 | Temp 98.4°F | Resp 16 | Wt 190.0 lb

## 2011-07-30 DIAGNOSIS — M545 Low back pain: Secondary | ICD-10-CM

## 2011-07-30 DIAGNOSIS — N400 Enlarged prostate without lower urinary tract symptoms: Secondary | ICD-10-CM

## 2011-07-30 DIAGNOSIS — Z720 Tobacco use: Secondary | ICD-10-CM

## 2011-07-30 DIAGNOSIS — Z0000 Encounter for general adult medical examination without abnormal findings: Secondary | ICD-10-CM

## 2011-07-30 DIAGNOSIS — N529 Male erectile dysfunction, unspecified: Secondary | ICD-10-CM

## 2011-07-30 DIAGNOSIS — E785 Hyperlipidemia, unspecified: Secondary | ICD-10-CM

## 2011-07-30 DIAGNOSIS — R635 Abnormal weight gain: Secondary | ICD-10-CM

## 2011-07-30 DIAGNOSIS — F17200 Nicotine dependence, unspecified, uncomplicated: Secondary | ICD-10-CM

## 2011-07-30 LAB — CBC WITH DIFFERENTIAL/PLATELET
Basophils Relative: 1.3 % (ref 0.0–3.0)
Eosinophils Absolute: 0.2 10*3/uL (ref 0.0–0.7)
HCT: 43 % (ref 39.0–52.0)
Hemoglobin: 13.9 g/dL (ref 13.0–17.0)
Lymphocytes Relative: 41.4 % (ref 12.0–46.0)
Lymphs Abs: 2.6 10*3/uL (ref 0.7–4.0)
MCHC: 32.2 g/dL (ref 30.0–36.0)
MCV: 92.9 fl (ref 78.0–100.0)
Monocytes Absolute: 0.7 10*3/uL (ref 0.1–1.0)
Neutro Abs: 2.7 10*3/uL (ref 1.4–7.7)
RBC: 4.63 Mil/uL (ref 4.22–5.81)

## 2011-07-30 LAB — HEPATIC FUNCTION PANEL
AST: 20 U/L (ref 0–37)
Bilirubin, Direct: 0 mg/dL (ref 0.0–0.3)
Total Bilirubin: 0.5 mg/dL (ref 0.3–1.2)

## 2011-07-30 LAB — LIPID PANEL
HDL: 38.1 mg/dL — ABNORMAL LOW (ref >39.00)
Total CHOL/HDL Ratio: 5

## 2011-07-30 LAB — URINALYSIS, ROUTINE W REFLEX MICROSCOPIC
Leukocytes, UA: NEGATIVE
Nitrite: NEGATIVE
Specific Gravity, Urine: >=1.03 (ref 1.000–1.030)
Urobilinogen, UA: 0.2 (ref 0.0–1.0)

## 2011-07-30 LAB — BASIC METABOLIC PANEL
CO2: 27 mEq/L (ref 19–32)
Chloride: 105 mEq/L (ref 96–112)
Glucose, Bld: 93 mg/dL (ref 70–99)
Sodium: 140 mEq/L (ref 135–145)

## 2011-07-30 MED ORDER — TAMSULOSIN HCL 0.4 MG PO CAPS: 0.4000 mg | ORAL_CAPSULE | Freq: Every day | ORAL | Status: DC

## 2011-07-30 MED ORDER — HYDROCODONE-ACETAMINOPHEN 10-325 MG PO TABS: 1.0000 | ORAL_TABLET | Freq: Four times a day (QID) | ORAL | Status: DC | PRN

## 2011-07-30 MED ORDER — CYCLOBENZAPRINE HCL 10 MG PO TABS: 10.0000 mg | ORAL_TABLET | Freq: Three times a day (TID) | ORAL | Status: DC | PRN

## 2011-07-30 NOTE — Assessment & Plan Note (Signed)
Chronic, refractory 1 ppd

## 2011-07-30 NOTE — Assessment & Plan Note (Signed)
Chronic. OA.  Potential benefits of a long term opioids use as well as potential risks (i.e. addiction risk, apnea etc) and complications (i.e. Somnolence, constipation and others) were explained to the patient and were aknowledged. Continue with current prescription therapy as reflected on the Med list.

## 2011-07-30 NOTE — Assessment & Plan Note (Signed)
Continue with current prescription therapy as reflected on the Med list.  

## 2011-07-30 NOTE — Assessment & Plan Note (Signed)
Continue with current prescription therapy as reflected on the Med list. Labs  

## 2011-07-30 NOTE — Assessment & Plan Note (Signed)
Better  

## 2011-07-30 NOTE — Progress Notes (Signed)
Patient ID: Gordon Lucas, male   DOB: 1950/11/29, 61 y.o.   MRN: [id number] Patient ID: Gordon Lucas, male   DOB: 1950-07-08, 61 y.o.   MRN: [id number]  Subjective:    Patient ID: Gordon Lucas, male    DOB: 02-10-1951, 61 y.o.   MRN: [id number]  HPI  The patient is here for a wellness exam. The patient has been doing well overall without major physical or psychological issues going on lately.  The patient is here to follow up on chronic LBP, depression, anxiety, headaches and chronic moderate OA symptoms controlled with medicines. He gets some exercise - walking.  Wt Readings from Last 3 Encounters:  07/30/11 190 lb (86.183 kg)  05/01/11 192 lb (87.091 kg)  01/29/11 193 lb (87.544 kg)   BP Readings from Last 3 Encounters:  07/30/11 120/70  05/01/11 120/88  01/29/11 132/76     Review of Systems  Constitutional: Positive for unexpected weight change. Negative for appetite change and fatigue.  HENT: Negative for nosebleeds, congestion, sore throat, sneezing, trouble swallowing and neck pain.   Eyes: Negative for itching and visual disturbance.  Respiratory: Negative for cough.   Cardiovascular: Negative for chest pain, palpitations and leg swelling.  Gastrointestinal: Negative for nausea, diarrhea, blood in stool and abdominal distention.  Genitourinary: Negative for frequency and hematuria.  Musculoskeletal: Positive for back pain and arthralgias. Negative for joint swelling and gait problem.  Skin: Negative for rash.  Neurological: Negative for dizziness, tremors, speech difficulty and weakness.  Psychiatric/Behavioral: Positive for sleep disturbance. Negative for suicidal ideas, dysphoric mood and agitation. The patient is nervous/anxious.        Objective:   Physical Exam  Constitutional: He is oriented to person, place, and time. He appears well-developed.       obese  HENT:  Mouth/Throat: Oropharynx is clear and moist.  Eyes: Conjunctivae are normal.  Pupils are equal, round, and reactive to light.  Neck: Normal range of motion. No JVD present. No thyromegaly present.  Cardiovascular: Normal rate, regular rhythm, normal heart sounds and intact distal pulses.  Exam reveals no gallop and no friction rub.   No murmur heard. Pulmonary/Chest: Effort normal and breath sounds normal. No respiratory distress. He has no wheezes. He has no rales. He exhibits no tenderness.  Abdominal: Soft. Bowel sounds are normal. He exhibits no distension and no mass. There is no tenderness. There is no rebound and no guarding.  Musculoskeletal: Normal range of motion. He exhibits tenderness (LS is stiff and tender). He exhibits no edema.  Lymphadenopathy:    He has no cervical adenopathy.  Neurological: He is alert and oriented to person, place, and time. He has normal reflexes. No cranial nerve deficit. He exhibits normal muscle tone. Coordination normal.  Skin: Skin is warm and dry. No rash noted.  Psychiatric: He has a normal mood and affect. His behavior is normal. Judgment and thought content normal.  He refused rectal exam  Lab Results  Component Value Date   WBC 5.3 07/26/2010   HGB 13.9 07/26/2010   HCT 42.5 07/26/2010   PLT 188.0 07/26/2010   GLUCOSE 87 07/26/2010   CHOL 162 07/26/2010   TRIG 87.0 07/26/2010   HDL 40.60 07/26/2010   LDLDIRECT 163.3 06/07/2008   LDLCALC 104* 07/26/2010   ALT 25 07/26/2010   AST 21 07/26/2010   NA 141 07/26/2010   K 4.7 07/26/2010   CL 106 07/26/2010   CREATININE 0.8 07/26/2010   BUN 11 07/26/2010  CO2 27 07/26/2010   TSH 0.79 07/26/2010   PSA 1.33 07/26/2010   HGBA1C 5.3 06/07/2008         Assessment & Plan:

## 2011-07-30 NOTE — Assessment & Plan Note (Addendum)
The patient is here for annual Medicare wellness examination and management of other chronic and acute problems.   The risk factors are reflected in the social history.  The roster of all physicians providing medical care to patient - is listed in the Snapshot section of the chart.  Activities of daily living:  The patient is 100% inedpendent in all ADLs: dressing, toileting, feeding as well as independent mobility  Home safety : The patient has smoke detectors in the home. They wear seatbelts. There is no violence in the home.   There is no risks for hepatitis, STDs or HIV. There is no   history of blood transfusion. They have no travel history to infectious disease endemic areas of the world.  The patient has  seen their dentist in the last 12 month. They have  seen their eye doctor in the last year. They deny  Any major hearing difficulty and have not had audiologic testing in the last year.  They do not  have excessive sun exposure. Discussed the need for sun protection: hats, long sleeves and use of sunscreen if there is significant sun exposure.   Diet: the importance of a healthy diet is discussed. They do have a reasonably healthy  diet.  The patient has a fairly regular exercise program of a mixed nature: walking, yard work, etc.The benefits of regular aerobic exercise were discussed.  Depression screen: there are no signs or vegative symptoms of depression- irritability, change in appetite, anhedonia, sadness/tearfullness.  Cognitive assessment: the patient manages all their financial and personal affairs and is actively engaged. They could relate day,date,year and events; recalled 3/3 objects at 3 minutes  The following portions of the patient's history were reviewed and updated as appropriate: allergies, current medications, past family history, past medical history,  past surgical history, past social history  and problem list.  Vision, hearing, body mass index were assessed and  reviewed.   During the course of the visit the patient was educated and counseled about appropriate screening and preventive services including : fall prevention , diabetes screening, nutrition counseling, colorectal cancer screening, and recommended immunizations.  He declined colonoscopy, Zostavax

## 2011-07-31 LAB — PSA: PSA: 3.14 ng/mL (ref 0.10–4.00)

## 2011-08-02 ENCOUNTER — Telehealth: Payer: Self-pay | Admitting: Internal Medicine

## 2011-08-02 DIAGNOSIS — R3129 Other microscopic hematuria: Secondary | ICD-10-CM

## 2011-08-02 NOTE — Telephone Encounter (Signed)
Gordon Lucas, please, inform patient that all labs are normal except for a microscopic blood in urine. He did see a specialist for this in the past, correct? Thx

## 2011-08-02 NOTE — Telephone Encounter (Signed)
Called pt no answer/machine.   Will try again later.

## 2011-08-02 NOTE — Assessment & Plan Note (Signed)
Chronic. S/p neg Urol w/up

## 2011-08-03 NOTE — Telephone Encounter (Signed)
Called pt- no answer/machine. 

## 2011-08-07 NOTE — Telephone Encounter (Signed)
Called pt no answer LMOM RTC ASAP concerning labs... 08/07/11@2 :03pm/LMB

## 2011-08-14 NOTE — Telephone Encounter (Signed)
Noted. Thx.

## 2011-08-14 NOTE — Telephone Encounter (Signed)
Pt informed. He states he sees a specialist.

## 2011-10-29 ENCOUNTER — Other Ambulatory Visit: Payer: Self-pay

## 2011-10-30 ENCOUNTER — Other Ambulatory Visit: Payer: Self-pay | Admitting: *Deleted

## 2011-10-30 MED ORDER — HYDROCODONE-ACETAMINOPHEN 10-325 MG PO TABS: 1.0000 | ORAL_TABLET | Freq: Four times a day (QID) | ORAL | Status: DC | PRN

## 2011-11-16 ENCOUNTER — Encounter: Payer: Self-pay | Admitting: Internal Medicine

## 2011-11-16 ENCOUNTER — Ambulatory Visit (INDEPENDENT_AMBULATORY_CARE_PROVIDER_SITE_OTHER): Payer: Medicare Other | Admitting: Internal Medicine

## 2011-11-16 VITALS — BP 140/90 | HR 68 | Temp 98.7°F | Resp 16 | Wt 191.0 lb

## 2011-11-16 DIAGNOSIS — F17200 Nicotine dependence, unspecified, uncomplicated: Secondary | ICD-10-CM

## 2011-11-16 DIAGNOSIS — N400 Enlarged prostate without lower urinary tract symptoms: Secondary | ICD-10-CM

## 2011-11-16 DIAGNOSIS — M545 Low back pain: Secondary | ICD-10-CM

## 2011-11-16 DIAGNOSIS — F4321 Adjustment disorder with depressed mood: Secondary | ICD-10-CM

## 2011-11-16 DIAGNOSIS — Z23 Encounter for immunization: Secondary | ICD-10-CM

## 2011-11-16 DIAGNOSIS — N529 Male erectile dysfunction, unspecified: Secondary | ICD-10-CM

## 2011-11-16 DIAGNOSIS — Z720 Tobacco use: Secondary | ICD-10-CM

## 2011-11-16 DIAGNOSIS — R030 Elevated blood-pressure reading, without diagnosis of hypertension: Secondary | ICD-10-CM

## 2011-11-16 MED ORDER — HYDROCODONE-ACETAMINOPHEN 10-325 MG PO TABS: 1.0000 | ORAL_TABLET | Freq: Four times a day (QID) | ORAL | Status: DC | PRN

## 2011-11-16 MED ORDER — TAMSULOSIN HCL 0.4 MG PO CAPS: 0.4000 mg | ORAL_CAPSULE | Freq: Every day | ORAL | Status: DC

## 2011-11-16 MED ORDER — CYCLOBENZAPRINE HCL 10 MG PO TABS: 10.0000 mg | ORAL_TABLET | Freq: Three times a day (TID) | ORAL | Status: DC | PRN

## 2011-11-16 NOTE — Assessment & Plan Note (Signed)
Better  

## 2011-11-16 NOTE — Addendum Note (Signed)
Addended by: Merrilyn Puma on: 11/16/2011 08:19 AM   Modules accepted: Orders

## 2011-11-16 NOTE — Assessment & Plan Note (Signed)
Continue with current prescription therapy as reflected on the Med list.  

## 2011-11-16 NOTE — Assessment & Plan Note (Signed)
Discussed.

## 2011-11-16 NOTE — Assessment & Plan Note (Signed)
NAS diet discussed 

## 2011-11-16 NOTE — Progress Notes (Signed)
   Subjective:    Patient ID: Gordon Lucas, male    DOB: 10-03-1950, 61 y.o.   MRN: [id number]  HPI    The patient is here to follow up on chronic LBP, depression, anxiety, headaches and chronic moderate OA symptoms controlled with medicines. He gets some exercise - walking.  Wt Readings from Last 3 Encounters:  11/16/11 191 lb (86.637 kg)  07/30/11 190 lb (86.183 kg)  05/01/11 192 lb (87.091 kg)   BP Readings from Last 3 Encounters:  11/16/11 140/90  07/30/11 120/70  05/01/11 120/88     Review of Systems  Constitutional: Positive for unexpected weight change. Negative for appetite change and fatigue.  HENT: Negative for nosebleeds, congestion, sore throat, sneezing, trouble swallowing and neck pain.   Eyes: Negative for itching and visual disturbance.  Respiratory: Negative for cough.   Cardiovascular: Negative for chest pain, palpitations and leg swelling.  Gastrointestinal: Negative for nausea, diarrhea, blood in stool and abdominal distention.  Genitourinary: Negative for frequency and hematuria.  Musculoskeletal: Positive for back pain and arthralgias. Negative for joint swelling and gait problem.  Skin: Negative for rash.  Neurological: Negative for dizziness, tremors, speech difficulty and weakness.  Psychiatric/Behavioral: Positive for disturbed wake/sleep cycle. Negative for suicidal ideas, dysphoric mood and agitation. The patient is nervous/anxious.        Objective:   Physical Exam  Constitutional: He is oriented to person, place, and time. He appears well-developed.       obese  HENT:  Mouth/Throat: Oropharynx is clear and moist.  Eyes: Conjunctivae are normal. Pupils are equal, round, and reactive to light.  Neck: Normal range of motion. No JVD present. No thyromegaly present.  Cardiovascular: Normal rate, regular rhythm, normal heart sounds and intact distal pulses.  Exam reveals no gallop and no friction rub.   No murmur heard. Pulmonary/Chest:  Effort normal and breath sounds normal. No respiratory distress. He has no wheezes. He has no rales. He exhibits no tenderness.  Abdominal: Soft. Bowel sounds are normal. He exhibits no distension and no mass. There is no tenderness. There is no rebound and no guarding.  Musculoskeletal: Normal range of motion. He exhibits tenderness (LS is stiff and tender). He exhibits no edema.  Lymphadenopathy:    He has no cervical adenopathy.  Neurological: He is alert and oriented to person, place, and time. He has normal reflexes. No cranial nerve deficit. He exhibits normal muscle tone. Coordination normal.  Skin: Skin is warm and dry. No rash noted.  Psychiatric: He has a normal mood and affect. His behavior is normal. Judgment and thought content normal.  He refused rectal exam  Lab Results  Component Value Date   WBC 6.3 07/30/2011   HGB 13.9 07/30/2011   HCT 43.0 07/30/2011   PLT 176.0 07/30/2011   GLUCOSE 93 07/30/2011   CHOL 183 07/30/2011   TRIG 88.0 07/30/2011   HDL 38.10* 07/30/2011   LDLDIRECT 163.3 06/07/2008   LDLCALC 127* 07/30/2011   ALT 23 07/30/2011   AST 20 07/30/2011   NA 140 07/30/2011   K 4.2 07/30/2011   CL 105 07/30/2011   CREATININE 0.9 07/30/2011   BUN 12 07/30/2011   CO2 27 07/30/2011   TSH 1.00 07/30/2011   PSA 3.14 07/30/2011   HGBA1C 5.3 06/07/2008         Assessment & Plan:

## 2012-02-27 ENCOUNTER — Other Ambulatory Visit (INDEPENDENT_AMBULATORY_CARE_PROVIDER_SITE_OTHER): Payer: Medicare Other

## 2012-02-27 ENCOUNTER — Encounter: Payer: Self-pay | Admitting: Internal Medicine

## 2012-02-27 ENCOUNTER — Ambulatory Visit (INDEPENDENT_AMBULATORY_CARE_PROVIDER_SITE_OTHER): Payer: Medicare Other | Admitting: Internal Medicine

## 2012-02-27 VITALS — BP 120/60 | HR 76 | Temp 98.0°F | Resp 16 | Wt 197.0 lb

## 2012-02-27 DIAGNOSIS — R972 Elevated prostate specific antigen [PSA]: Secondary | ICD-10-CM

## 2012-02-27 DIAGNOSIS — R030 Elevated blood-pressure reading, without diagnosis of hypertension: Secondary | ICD-10-CM

## 2012-02-27 DIAGNOSIS — J4489 Other specified chronic obstructive pulmonary disease: Secondary | ICD-10-CM

## 2012-02-27 DIAGNOSIS — R635 Abnormal weight gain: Secondary | ICD-10-CM

## 2012-02-27 DIAGNOSIS — N529 Male erectile dysfunction, unspecified: Secondary | ICD-10-CM

## 2012-02-27 DIAGNOSIS — M545 Low back pain, unspecified: Secondary | ICD-10-CM

## 2012-02-27 DIAGNOSIS — F17200 Nicotine dependence, unspecified, uncomplicated: Secondary | ICD-10-CM

## 2012-02-27 DIAGNOSIS — J449 Chronic obstructive pulmonary disease, unspecified: Secondary | ICD-10-CM

## 2012-02-27 DIAGNOSIS — Z720 Tobacco use: Secondary | ICD-10-CM

## 2012-02-27 LAB — BASIC METABOLIC PANEL
BUN: 12 mg/dL (ref 6–23)
CO2: 29 mEq/L (ref 19–32)
Chloride: 107 mEq/L (ref 96–112)
Creatinine, Ser: 0.8 mg/dL (ref 0.4–1.5)
Glucose, Bld: 112 mg/dL — ABNORMAL HIGH (ref 70–99)
Potassium: 3.8 mEq/L (ref 3.5–5.1)

## 2012-02-27 LAB — PSA, TOTAL AND FREE: PSA, Free: 0.31 ng/mL

## 2012-02-27 MED ORDER — BASE A POLYETHYLENE GLYCOL POWD: 17.0000 g | Freq: Every day | Status: DC | PRN

## 2012-02-27 MED ORDER — HYDROCODONE-ACETAMINOPHEN 10-325 MG PO TABS: 1.0000 | ORAL_TABLET | Freq: Four times a day (QID) | ORAL | Status: DC | PRN

## 2012-02-27 MED ORDER — CYCLOBENZAPRINE HCL 10 MG PO TABS: 10.0000 mg | ORAL_TABLET | Freq: Three times a day (TID) | ORAL | Status: DC | PRN

## 2012-02-27 MED ORDER — CELECOXIB 200 MG PO CAPS: 200.0000 mg | ORAL_CAPSULE | Freq: Two times a day (BID) | ORAL | Status: DC

## 2012-02-27 MED ORDER — SILDENAFIL CITRATE 100 MG PO TABS: 100.0000 mg | ORAL_TABLET | Freq: Every day | ORAL | Status: DC | PRN

## 2012-02-27 MED ORDER — AMLODIPINE BESYLATE 5 MG PO TABS: 5.0000 mg | ORAL_TABLET | Freq: Every day | ORAL | Status: DC

## 2012-02-27 MED ORDER — TAMSULOSIN HCL 0.4 MG PO CAPS: 0.4000 mg | ORAL_CAPSULE | Freq: Every day | ORAL | Status: DC

## 2012-02-27 NOTE — Assessment & Plan Note (Signed)
Continue with current prescription therapy as reflected on the Med list.  

## 2012-02-27 NOTE — Assessment & Plan Note (Signed)
Discussed.

## 2012-02-27 NOTE — Assessment & Plan Note (Signed)
Wt Readings from Last 3 Encounters:  02/27/12 197 lb (89.359 kg)  11/16/11 191 lb (86.637 kg)  07/30/11 190 lb (86.183 kg)

## 2012-02-27 NOTE — Progress Notes (Signed)
Subjective:    Patient ID: Gordon Lucas, male    DOB: [birth year], 61 y.o.   MRN: [id number]  HPI   C/o a fall on ice 2 wks ago - hit L post lower chest The patient is here to follow up on chronic LBP, depression, anxiety, headaches and chronic moderate OA symptoms controlled with medicines. He gets some exercise - walking.  Wt Readings from Last 3 Encounters:  02/27/12 197 lb (89.359 kg)  11/16/11 191 lb (86.637 kg)  07/30/11 190 lb (86.183 kg)   BP Readings from Last 3 Encounters:  02/27/12 120/60  11/16/11 140/90  07/30/11 120/70     Review of Systems  Constitutional: Positive for unexpected weight change. Negative for appetite change and fatigue.  HENT: Negative for nosebleeds, congestion, sore throat, sneezing, trouble swallowing and neck pain.   Eyes: Negative for itching and visual disturbance.  Respiratory: Negative for cough.   Cardiovascular: Positive for chest pain. Negative for palpitations and leg swelling.  Gastrointestinal: Negative for nausea, diarrhea, blood in stool and abdominal distention.  Genitourinary: Negative for frequency and hematuria.  Musculoskeletal: Positive for back pain and arthralgias. Negative for joint swelling and gait problem.  Skin: Negative for rash.  Neurological: Negative for dizziness, tremors, speech difficulty and weakness.  Psychiatric/Behavioral: Positive for sleep disturbance. Negative for suicidal ideas, dysphoric mood and agitation. The patient is nervous/anxious.        Objective:   Physical Exam  Constitutional: He is oriented to person, place, and time. He appears well-developed.       obese  HENT:  Mouth/Throat: Oropharynx is clear and moist.  Eyes: Conjunctivae normal are normal. Pupils are equal, round, and reactive to light.  Neck: Normal range of motion. No JVD present. No thyromegaly present.  Cardiovascular: Normal rate, regular rhythm, normal heart sounds and intact distal pulses.  Exam reveals no  gallop and no friction rub.   No murmur heard. Pulmonary/Chest: Effort normal and breath sounds normal. No respiratory distress. He has no wheezes. He has no rales. He exhibits no tenderness.  Abdominal: Soft. Bowel sounds are normal. He exhibits no distension and no mass. There is no tenderness. There is no rebound and no guarding.  Musculoskeletal: Normal range of motion. He exhibits tenderness (LS is stiff and tender). He exhibits no edema.       L post lower chest is sensitive  Lymphadenopathy:    He has no cervical adenopathy.  Neurological: He is alert and oriented to person, place, and time. He has normal reflexes. No cranial nerve deficit. He exhibits normal muscle tone. Coordination normal.  Skin: Skin is warm and dry. No rash noted.  Psychiatric: He has a normal mood and affect. His behavior is normal. Judgment and thought content normal.  He refused rectal exam  Lab Results  Component Value Date   WBC 6.3 07/30/2011   HGB 13.9 07/30/2011   HCT 43.0 07/30/2011   PLT 176.0 07/30/2011   GLUCOSE 93 07/30/2011   CHOL 183 07/30/2011   TRIG 88.0 07/30/2011   HDL 38.10* 07/30/2011   LDLDIRECT 163.3 06/07/2008   LDLCALC 127* 07/30/2011   ALT 23 07/30/2011   AST 20 07/30/2011   NA 140 07/30/2011   K 4.2 07/30/2011   CL 105 07/30/2011   CREATININE 0.9 07/30/2011   BUN 12 07/30/2011   CO2 27 07/30/2011   TSH 1.00 07/30/2011   PSA 3.14 07/30/2011   HGBA1C 5.3 06/07/2008         Assessment &  Plan:

## 2012-02-27 NOTE — Assessment & Plan Note (Signed)
Free PSA 

## 2012-02-27 NOTE — Assessment & Plan Note (Signed)
Better  

## 2012-05-12 ENCOUNTER — Other Ambulatory Visit: Payer: Self-pay | Admitting: Internal Medicine

## 2012-05-23 ENCOUNTER — Ambulatory Visit: Payer: BC Managed Care – PPO | Admitting: Internal Medicine

## 2012-05-23 DIAGNOSIS — Z0289 Encounter for other administrative examinations: Secondary | ICD-10-CM

## 2012-05-27 ENCOUNTER — Telehealth: Payer: Self-pay | Admitting: Internal Medicine

## 2012-05-27 MED ORDER — CYCLOBENZAPRINE HCL 10 MG PO TABS: 10.0000 mg | ORAL_TABLET | Freq: Three times a day (TID) | ORAL | Status: DC | PRN

## 2012-05-27 MED ORDER — TAMSULOSIN HCL 0.4 MG PO CAPS: 0.4000 mg | ORAL_CAPSULE | Freq: Every day | ORAL | Status: DC

## 2012-05-27 MED ORDER — HYDROCODONE-ACETAMINOPHEN 10-325 MG PO TABS: 1.0000 | ORAL_TABLET | Freq: Four times a day (QID) | ORAL | Status: DC | PRN

## 2012-05-27 NOTE — Telephone Encounter (Signed)
Rxs printed/signed/given to pt.  

## 2012-05-27 NOTE — Telephone Encounter (Signed)
Patient came in today hoping to be worked in because he is out of his medications, he is waiting in the lobby for a refill on his norco, flexeril, and flomax. I scheduled him for tomorrow at 4 which is the first available appointment with Dr. Posey Rea

## 2012-05-28 ENCOUNTER — Encounter: Payer: Self-pay | Admitting: Internal Medicine

## 2012-05-28 ENCOUNTER — Ambulatory Visit (INDEPENDENT_AMBULATORY_CARE_PROVIDER_SITE_OTHER): Payer: Medicare PPO | Admitting: Internal Medicine

## 2012-05-28 VITALS — BP 110/74 | HR 84 | Temp 98.1°F | Resp 16 | Wt 193.8 lb

## 2012-05-28 DIAGNOSIS — R972 Elevated prostate specific antigen [PSA]: Secondary | ICD-10-CM

## 2012-05-28 DIAGNOSIS — F17200 Nicotine dependence, unspecified, uncomplicated: Secondary | ICD-10-CM

## 2012-05-28 DIAGNOSIS — Z720 Tobacco use: Secondary | ICD-10-CM

## 2012-05-28 DIAGNOSIS — E785 Hyperlipidemia, unspecified: Secondary | ICD-10-CM

## 2012-05-28 DIAGNOSIS — M545 Low back pain: Secondary | ICD-10-CM

## 2012-05-28 NOTE — Progress Notes (Signed)
Patient ID: Gordon Lucas, male   DOB: Nov 06, 1950, 62 y.o.   MRN: [id number]   Subjective:    Patient ID: Gordon Lucas, male    DOB: 1950/10/19, 62 y.o.   MRN: [id number]  HPI   C/o a fall on ice 2 wks ago - hit L post lower chest The patient is here to follow up on chronic LBP, depression, anxiety, headaches and chronic moderate OA symptoms controlled with medicines. He gets some exercise - walking.      Review of Systems  Constitutional: Positive for unexpected weight change. Negative for appetite change and fatigue.  HENT: Negative for nosebleeds, congestion, sore throat, sneezing, trouble swallowing and neck pain.   Eyes: Negative for itching and visual disturbance.  Respiratory: Negative for cough.   Cardiovascular: Positive for chest pain. Negative for palpitations and leg swelling.  Gastrointestinal: Negative for nausea, diarrhea, blood in stool and abdominal distention.  Genitourinary: Negative for frequency and hematuria.  Musculoskeletal: Positive for back pain and arthralgias. Negative for joint swelling and gait problem.  Skin: Negative for rash.  Neurological: Negative for dizziness, tremors, speech difficulty and weakness.  Psychiatric/Behavioral: Positive for sleep disturbance. Negative for suicidal ideas, dysphoric mood and agitation. The patient is nervous/anxious.        Objective:   Physical Exam  Constitutional: He is oriented to person, place, and time. He appears well-developed.       obese  HENT:  Mouth/Throat: Oropharynx is clear and moist.  Eyes: Conjunctivae normal are normal. Pupils are equal, round, and reactive to light.  Neck: Normal range of motion. No JVD present. No thyromegaly present.  Cardiovascular: Normal rate, regular rhythm, normal heart sounds and intact distal pulses.  Exam reveals no gallop and no friction rub.   No murmur heard. Pulmonary/Chest: Effort normal and breath sounds normal. No respiratory distress. He has no  wheezes. He has no rales. He exhibits no tenderness.  Abdominal: Soft. Bowel sounds are normal. He exhibits no distension and no mass. There is no tenderness. There is no rebound and no guarding.  Musculoskeletal: Normal range of motion. He exhibits tenderness (LS is stiff and tender). He exhibits no edema.       L post lower chest is sensitive  Lymphadenopathy:    He has no cervical adenopathy.  Neurological: He is alert and oriented to person, place, and time. He has normal reflexes. No cranial nerve deficit. He exhibits normal muscle tone. Coordination normal.  Skin: Skin is warm and dry. No rash noted.  Psychiatric: He has a normal mood and affect. His behavior is normal. Judgment and thought content normal.  He refused rectal exam  Lab Results  Component Value Date                                                                                                                  Assessment & Plan:

## 2012-06-09 NOTE — Assessment & Plan Note (Signed)
Lab Results  Component Value Date   PSA 1.08 02/27/2012   PSA 3.14 07/30/2011   PSA 1.33 07/26/2010

## 2012-06-09 NOTE — Assessment & Plan Note (Signed)
Continue with current prescription therapy as reflected on the Med list.  

## 2012-06-09 NOTE — Assessment & Plan Note (Signed)
Discussed.

## 2012-06-23 ENCOUNTER — Telehealth: Payer: Self-pay | Admitting: Internal Medicine

## 2012-06-23 MED ORDER — HYDROCODONE-ACETAMINOPHEN 10-325 MG PO TABS: 1.0000 | ORAL_TABLET | Freq: Four times a day (QID) | ORAL | Status: DC | PRN

## 2012-06-23 NOTE — Telephone Encounter (Signed)
Ok per Dr. Posey Rea to fill with 1 Rf. Rx printed/signed by MD and given to pt.

## 2012-06-23 NOTE — Telephone Encounter (Signed)
Patient would like a RX for Hydrocodone written for two months.  Patient will wait in lobby.

## 2012-08-12 ENCOUNTER — Ambulatory Visit (INDEPENDENT_AMBULATORY_CARE_PROVIDER_SITE_OTHER): Payer: Medicare PPO | Admitting: Internal Medicine

## 2012-08-12 ENCOUNTER — Encounter: Payer: Self-pay | Admitting: Internal Medicine

## 2012-08-12 VITALS — BP 160/92 | HR 80 | Temp 98.0°F | Resp 16 | Wt 191.0 lb

## 2012-08-12 DIAGNOSIS — N529 Male erectile dysfunction, unspecified: Secondary | ICD-10-CM

## 2012-08-12 DIAGNOSIS — R030 Elevated blood-pressure reading, without diagnosis of hypertension: Secondary | ICD-10-CM

## 2012-08-12 DIAGNOSIS — R972 Elevated prostate specific antigen [PSA]: Secondary | ICD-10-CM

## 2012-08-12 DIAGNOSIS — K5900 Constipation, unspecified: Secondary | ICD-10-CM

## 2012-08-12 DIAGNOSIS — E785 Hyperlipidemia, unspecified: Secondary | ICD-10-CM

## 2012-08-12 DIAGNOSIS — R635 Abnormal weight gain: Secondary | ICD-10-CM

## 2012-08-12 DIAGNOSIS — M545 Low back pain: Secondary | ICD-10-CM

## 2012-08-12 DIAGNOSIS — J449 Chronic obstructive pulmonary disease, unspecified: Secondary | ICD-10-CM

## 2012-08-12 MED ORDER — HYDROCODONE-ACETAMINOPHEN 10-325 MG PO TABS: 1.0000 | ORAL_TABLET | Freq: Four times a day (QID) | ORAL | Status: DC | PRN

## 2012-08-12 MED ORDER — CELECOXIB 200 MG PO CAPS: 200.0000 mg | ORAL_CAPSULE | Freq: Two times a day (BID) | ORAL | Status: DC

## 2012-08-12 MED ORDER — TAMSULOSIN HCL 0.4 MG PO CAPS: 0.4000 mg | ORAL_CAPSULE | Freq: Every day | ORAL | Status: DC

## 2012-08-12 MED ORDER — CYCLOBENZAPRINE HCL 10 MG PO TABS: 10.0000 mg | ORAL_TABLET | Freq: Three times a day (TID) | ORAL | Status: DC | PRN

## 2012-08-12 MED ORDER — SILDENAFIL CITRATE 100 MG PO TABS: 50.0000 mg | ORAL_TABLET | ORAL | Status: DC | PRN

## 2012-08-12 NOTE — Progress Notes (Signed)
   Subjective:    HPI    The patient is here to follow up on chronic LBP, depression, anxiety, headaches and chronic moderate OA symptoms controlled with medicines. He gets some exercise - walking. He ran out of Vicodin on Sat - hurts more now  Wt Readings from Last 3 Encounters:  08/12/12 191 lb (86.637 kg)  05/28/12 193 lb 12 oz (87.884 kg)  02/27/12 197 lb (89.359 kg)   BP Readings from Last 3 Encounters:  08/12/12 160/92  05/28/12 110/74  02/27/12 120/60     Review of Systems  Constitutional: Positive for unexpected weight change. Negative for appetite change and fatigue.  HENT: Negative for nosebleeds, congestion, sore throat, sneezing, trouble swallowing and neck pain.   Eyes: Negative for itching and visual disturbance.  Respiratory: Negative for cough.   Cardiovascular: Negative for chest pain, palpitations and leg swelling.  Gastrointestinal: Negative for nausea, diarrhea, blood in stool and abdominal distention.  Genitourinary: Negative for frequency and hematuria.  Musculoskeletal: Positive for back pain and arthralgias. Negative for joint swelling and gait problem.  Skin: Negative for rash.  Neurological: Negative for dizziness, tremors, speech difficulty and weakness.  Psychiatric/Behavioral: Positive for sleep disturbance. Negative for suicidal ideas, dysphoric mood and agitation. The patient is nervous/anxious.        Objective:   Physical Exam  Constitutional: He is oriented to person, place, and time. He appears well-developed.  obese  HENT:  Mouth/Throat: Oropharynx is clear and moist.  Eyes: Conjunctivae are normal. Pupils are equal, round, and reactive to light.  Neck: Normal range of motion. No JVD present. No thyromegaly present.  Cardiovascular: Normal rate, regular rhythm, normal heart sounds and intact distal pulses.  Exam reveals no gallop and no friction rub.   No murmur heard. Pulmonary/Chest: Effort normal and breath sounds normal. No  respiratory distress. He has no wheezes. He has no rales. He exhibits no tenderness.  Abdominal: Soft. Bowel sounds are normal. He exhibits no distension and no mass. There is no tenderness. There is no rebound and no guarding.  Musculoskeletal: Normal range of motion. He exhibits tenderness (LS is stiff and tender). He exhibits no edema.  Lymphadenopathy:    He has no cervical adenopathy.  Neurological: He is alert and oriented to person, place, and time. He has normal reflexes. No cranial nerve deficit. He exhibits normal muscle tone. Coordination normal.  Skin: Skin is warm and dry. No rash noted.  Psychiatric: He has a normal mood and affect. His behavior is normal. Judgment and thought content normal.    Lab Results  Component Value Date   WBC 6.3 07/30/2011   HGB 13.9 07/30/2011   HCT 43.0 07/30/2011   PLT 176.0 07/30/2011   GLUCOSE 112* 02/27/2012   CHOL 183 07/30/2011   TRIG 88.0 07/30/2011   HDL 38.10* 07/30/2011   LDLDIRECT 163.3 06/07/2008   LDLCALC 127* 07/30/2011   ALT 23 07/30/2011   AST 20 07/30/2011   NA 139 02/27/2012   K 3.8 02/27/2012   CL 107 02/27/2012   CREATININE 0.8 02/27/2012   BUN 12 02/27/2012   CO2 29 02/27/2012   TSH 1.00 07/30/2011   PSA 1.08 02/27/2012   HGBA1C 5.3 06/07/2008         Assessment & Plan:

## 2012-08-12 NOTE — Assessment & Plan Note (Signed)
Continue with current prescription therapy as reflected on the Med list.  

## 2012-08-12 NOTE — Assessment & Plan Note (Signed)
resolved 

## 2012-08-12 NOTE — Assessment & Plan Note (Signed)
Wt Readings from Last 3 Encounters:  08/12/12 191 lb (86.637 kg)  05/28/12 193 lb 12 oz (87.884 kg)  02/27/12 197 lb (89.359 kg)

## 2012-08-12 NOTE — Assessment & Plan Note (Signed)
Discussed.

## 2012-08-12 NOTE — Assessment & Plan Note (Signed)
Check BP at the pharmacy - call if up

## 2012-08-12 NOTE — Assessment & Plan Note (Signed)
Watching  

## 2012-08-12 NOTE — Assessment & Plan Note (Signed)
Not on Rx Low fat diet

## 2012-08-27 ENCOUNTER — Ambulatory Visit: Payer: Medicare PPO | Admitting: Internal Medicine

## 2012-09-15 ENCOUNTER — Ambulatory Visit (INDEPENDENT_AMBULATORY_CARE_PROVIDER_SITE_OTHER): Payer: Medicare PPO

## 2012-09-15 DIAGNOSIS — R635 Abnormal weight gain: Secondary | ICD-10-CM

## 2012-09-15 DIAGNOSIS — K5900 Constipation, unspecified: Secondary | ICD-10-CM

## 2012-09-15 DIAGNOSIS — M545 Low back pain, unspecified: Secondary | ICD-10-CM

## 2012-09-15 DIAGNOSIS — J449 Chronic obstructive pulmonary disease, unspecified: Secondary | ICD-10-CM

## 2012-09-15 DIAGNOSIS — R030 Elevated blood-pressure reading, without diagnosis of hypertension: Secondary | ICD-10-CM

## 2012-09-15 DIAGNOSIS — N529 Male erectile dysfunction, unspecified: Secondary | ICD-10-CM

## 2012-09-15 DIAGNOSIS — R972 Elevated prostate specific antigen [PSA]: Secondary | ICD-10-CM

## 2012-09-15 DIAGNOSIS — E785 Hyperlipidemia, unspecified: Secondary | ICD-10-CM

## 2012-09-15 LAB — HEPATIC FUNCTION PANEL
ALT: 25 U/L (ref 0–53)
AST: 19 U/L (ref 0–37)
Alkaline Phosphatase: 38 U/L — ABNORMAL LOW (ref 39–117)
Bilirubin, Direct: 0.1 mg/dL (ref 0.0–0.3)
Total Bilirubin: 0.6 mg/dL (ref 0.3–1.2)

## 2012-09-15 LAB — TSH: TSH: 1.11 u[IU]/mL (ref 0.35–5.50)

## 2012-09-15 LAB — CBC WITH DIFFERENTIAL/PLATELET
Basophils Absolute: 0.1 10*3/uL (ref 0.0–0.1)
Eosinophils Relative: 3.8 % (ref 0.0–5.0)
HCT: 41.4 % (ref 39.0–52.0)
Lymphocytes Relative: 44.3 % (ref 12.0–46.0)
Lymphs Abs: 3.3 10*3/uL (ref 0.7–4.0)
Monocytes Relative: 11.4 % (ref 3.0–12.0)
Neutrophils Relative %: 39.2 % — ABNORMAL LOW (ref 43.0–77.0)
Platelets: 240 10*3/uL (ref 150.0–400.0)
RDW: 13.3 % (ref 11.5–14.6)
WBC: 7.5 10*3/uL (ref 4.5–10.5)

## 2012-09-15 LAB — URINALYSIS, ROUTINE W REFLEX MICROSCOPIC
Bilirubin Urine: NEGATIVE
Ketones, ur: NEGATIVE
Specific Gravity, Urine: >=1.03 (ref 1.000–1.030)
Urobilinogen, UA: 0.2 (ref 0.0–1.0)

## 2012-09-15 LAB — BASIC METABOLIC PANEL
BUN: 9 mg/dL (ref 6–23)
Calcium: 9.4 mg/dL (ref 8.4–10.5)
GFR: 101.1 mL/min (ref >60.00)
Glucose, Bld: 96 mg/dL (ref 70–99)
Potassium: 4.1 mEq/L (ref 3.5–5.1)

## 2012-09-15 LAB — LIPID PANEL
Total CHOL/HDL Ratio: 6
VLDL: 31.2 mg/dL (ref 0.0–40.0)

## 2012-09-16 LAB — DRUG SCREEN, URINE
Barbiturate Quant, Ur: NEGATIVE
Creatinine,U: 229.86 mg/dL
Methadone: NEGATIVE
Opiates: POSITIVE — AB
Phencyclidine (PCP): NEGATIVE
Propoxyphene: NEGATIVE

## 2012-09-17 ENCOUNTER — Other Ambulatory Visit: Payer: Self-pay | Admitting: Internal Medicine

## 2012-09-17 MED ORDER — CIPROFLOXACIN HCL 500 MG PO TABS: 500.0000 mg | ORAL_TABLET | Freq: Two times a day (BID) | ORAL | Status: DC

## 2012-10-14 ENCOUNTER — Encounter: Payer: Self-pay | Admitting: Internal Medicine

## 2012-10-14 ENCOUNTER — Ambulatory Visit (INDEPENDENT_AMBULATORY_CARE_PROVIDER_SITE_OTHER): Payer: Medicare PPO | Admitting: Internal Medicine

## 2012-10-14 VITALS — BP 160/82 | HR 80 | Temp 99.2°F | Resp 16 | Ht 67.0 in | Wt 195.0 lb

## 2012-10-14 DIAGNOSIS — Z720 Tobacco use: Secondary | ICD-10-CM

## 2012-10-14 DIAGNOSIS — E785 Hyperlipidemia, unspecified: Secondary | ICD-10-CM

## 2012-10-14 DIAGNOSIS — N529 Male erectile dysfunction, unspecified: Secondary | ICD-10-CM

## 2012-10-14 DIAGNOSIS — Z0000 Encounter for general adult medical examination without abnormal findings: Secondary | ICD-10-CM

## 2012-10-14 DIAGNOSIS — R030 Elevated blood-pressure reading, without diagnosis of hypertension: Secondary | ICD-10-CM

## 2012-10-14 DIAGNOSIS — F17200 Nicotine dependence, unspecified, uncomplicated: Secondary | ICD-10-CM

## 2012-10-14 DIAGNOSIS — M545 Low back pain: Secondary | ICD-10-CM

## 2012-10-14 DIAGNOSIS — R635 Abnormal weight gain: Secondary | ICD-10-CM

## 2012-10-14 DIAGNOSIS — N390 Urinary tract infection, site not specified: Secondary | ICD-10-CM

## 2012-10-14 MED ORDER — SILDENAFIL CITRATE 100 MG PO TABS: 50.0000 mg | ORAL_TABLET | ORAL | Status: DC | PRN

## 2012-10-14 MED ORDER — TAMSULOSIN HCL 0.4 MG PO CAPS: 0.4000 mg | ORAL_CAPSULE | Freq: Every day | ORAL | Status: DC

## 2012-10-14 MED ORDER — CIPROFLOXACIN HCL 500 MG PO TABS: 500.0000 mg | ORAL_TABLET | Freq: Two times a day (BID) | ORAL | Status: DC

## 2012-10-14 MED ORDER — HYDROCODONE-ACETAMINOPHEN 10-325 MG PO TABS: 1.0000 | ORAL_TABLET | Freq: Four times a day (QID) | ORAL | Status: DC | PRN

## 2012-10-14 NOTE — Assessment & Plan Note (Signed)
Empiric cipro

## 2012-10-14 NOTE — Assessment & Plan Note (Signed)
Statins were discussed, declined again

## 2012-10-14 NOTE — Assessment & Plan Note (Addendum)
Continue with current prn prescription therapy as reflected on the Med list. Stendra samples 100 mg to try

## 2012-10-14 NOTE — Assessment & Plan Note (Signed)
Wt Readings from Last 3 Encounters:  10/14/12 195 lb (88.451 kg)  08/12/12 191 lb (86.637 kg)  05/28/12 193 lb 12 oz (87.884 kg)

## 2012-10-14 NOTE — Assessment & Plan Note (Signed)
Discussed.

## 2012-10-14 NOTE — Assessment & Plan Note (Signed)
Restart Norvasc

## 2012-10-14 NOTE — Assessment & Plan Note (Signed)
Continue with current prescription therapy as reflected on the Med list.  

## 2012-10-14 NOTE — Progress Notes (Signed)
   Subjective:    HPI  The patient is here for a wellness exam. The patient has been doing well overall without major physical or psychological issues going on lately.  The patient is here to follow up on chronic LBP, depression, anxiety, headaches and chronic moderate OA symptoms controlled with medicines. He gets some exercise - walking.  Wt Readings from Last 3 Encounters:  10/14/12 195 lb (88.451 kg)  08/12/12 191 lb (86.637 kg)  05/28/12 193 lb 12 oz (87.884 kg)   BP Readings from Last 3 Encounters:  10/14/12 160/82  08/12/12 160/92  05/28/12 110/74     Review of Systems  Constitutional: Positive for unexpected weight change. Negative for appetite change and fatigue.  HENT: Negative for nosebleeds, congestion, sore throat, sneezing, trouble swallowing and neck pain.   Eyes: Negative for itching and visual disturbance.  Respiratory: Negative for cough.   Cardiovascular: Negative for chest pain, palpitations and leg swelling.  Gastrointestinal: Negative for nausea, diarrhea, blood in stool and abdominal distention.  Genitourinary: Negative for frequency and hematuria.  Musculoskeletal: Positive for back pain and arthralgias. Negative for joint swelling and gait problem.  Skin: Negative for rash.  Neurological: Negative for dizziness, tremors, speech difficulty and weakness.  Psychiatric/Behavioral: Positive for sleep disturbance. Negative for suicidal ideas, dysphoric mood and agitation. The patient is nervous/anxious.        Objective:   Physical Exam  Constitutional: He is oriented to person, place, and time. He appears well-developed.  obese  HENT:  Mouth/Throat: Oropharynx is clear and moist.  Eyes: Conjunctivae are normal. Pupils are equal, round, and reactive to light.  Neck: Normal range of motion. No JVD present. No thyromegaly present.  Cardiovascular: Normal rate, regular rhythm, normal heart sounds and intact distal pulses.  Exam reveals no gallop and no  friction rub.   No murmur heard. Pulmonary/Chest: Effort normal and breath sounds normal. No respiratory distress. He has no wheezes. He has no rales. He exhibits no tenderness.  Abdominal: Soft. Bowel sounds are normal. He exhibits no distension and no mass. There is no tenderness. There is no rebound and no guarding.  Musculoskeletal: Normal range of motion. He exhibits tenderness (LS is stiff and tender). He exhibits no edema.  Lymphadenopathy:    He has no cervical adenopathy.  Neurological: He is alert and oriented to person, place, and time. He has normal reflexes. No cranial nerve deficit. He exhibits normal muscle tone. Coordination normal.  Skin: Skin is warm and dry. No rash noted.  Psychiatric: He has a normal mood and affect. His behavior is normal. Judgment and thought content normal.  He refused rectal exam  Lab Results  Component Value Date   WBC 7.5 09/15/2012   HGB 13.6 09/15/2012   HCT 41.4 09/15/2012   PLT 240.0 09/15/2012   GLUCOSE 96 09/15/2012   CHOL 201* 09/15/2012   TRIG 156.0* 09/15/2012   HDL 32.90* 09/15/2012   LDLDIRECT 152.8 09/15/2012   LDLCALC 127* 07/30/2011   ALT 25 09/15/2012   AST 19 09/15/2012   NA 139 09/15/2012   K 4.1 09/15/2012   CL 105 09/15/2012   CREATININE 1.0 09/15/2012   BUN 9 09/15/2012   CO2 29 09/15/2012   TSH 1.11 09/15/2012   PSA 2.09 09/15/2012   HGBA1C 5.3 06/07/2008         Assessment & Plan:

## 2012-10-14 NOTE — Assessment & Plan Note (Signed)
Here for medicare wellness/physical  Diet: heart healthy  Physical activity: not sedentary  Depression/mood screen: negative  Hearing: intact to whispered voice  Visual acuity: grossly normal, performs annual eye exam  ADLs: capable  Fall risk: none  Home safety: good  Cognitive evaluation: intact to orientation, naming, recall and repetition  EOL planning: adv directives, full code/ I agree  I have personally reviewed and have noted  1. The patient's medical and social history  2. Their use of alcohol, tobacco or illicit drugs  3. Their current medications and supplements  4. The patient's functional ability including ADL's, fall risks, home safety risks and hearing or visual impairment.  5. Diet and physical activities  6. Evidence for depression or mood disorders    Today patient counseled on age appropriate routine health concerns for screening and prevention, each reviewed and up to date or declined. Immunizations reviewed and up to date or declined. Labs ordered and reviewed. Risk factors for depression reviewed and negative. Hearing function and visual acuity are intact. ADLs screened and addressed as needed. Functional ability and level of safety reviewed and appropriate. Education, counseling and referrals performed based on assessed risks today. Patient provided with a copy of personalized plan for preventive services.      He declined colonoscopy, Zostavax

## 2013-01-12 ENCOUNTER — Other Ambulatory Visit (INDEPENDENT_AMBULATORY_CARE_PROVIDER_SITE_OTHER): Payer: Medicare PPO

## 2013-01-12 DIAGNOSIS — N390 Urinary tract infection, site not specified: Secondary | ICD-10-CM

## 2013-01-12 LAB — URINALYSIS, ROUTINE W REFLEX MICROSCOPIC
Bilirubin Urine: NEGATIVE
Nitrite: NEGATIVE
Total Protein, Urine: NEGATIVE
Urine Glucose: NEGATIVE

## 2013-01-14 ENCOUNTER — Encounter: Payer: Self-pay | Admitting: Internal Medicine

## 2013-01-14 ENCOUNTER — Ambulatory Visit (INDEPENDENT_AMBULATORY_CARE_PROVIDER_SITE_OTHER): Payer: Medicare PPO | Admitting: Internal Medicine

## 2013-01-14 VITALS — BP 150/90 | HR 80 | Temp 97.9°F | Resp 16 | Wt 190.0 lb

## 2013-01-14 DIAGNOSIS — Z23 Encounter for immunization: Secondary | ICD-10-CM

## 2013-01-14 DIAGNOSIS — M545 Low back pain, unspecified: Secondary | ICD-10-CM

## 2013-01-14 DIAGNOSIS — J449 Chronic obstructive pulmonary disease, unspecified: Secondary | ICD-10-CM

## 2013-01-14 DIAGNOSIS — N390 Urinary tract infection, site not specified: Secondary | ICD-10-CM

## 2013-01-14 DIAGNOSIS — R319 Hematuria, unspecified: Secondary | ICD-10-CM

## 2013-01-14 DIAGNOSIS — J4489 Other specified chronic obstructive pulmonary disease: Secondary | ICD-10-CM

## 2013-01-14 DIAGNOSIS — R3129 Other microscopic hematuria: Secondary | ICD-10-CM

## 2013-01-14 DIAGNOSIS — R635 Abnormal weight gain: Secondary | ICD-10-CM

## 2013-01-14 MED ORDER — HYDROCODONE-ACETAMINOPHEN 10-325 MG PO TABS: 1.0000 | ORAL_TABLET | Freq: Four times a day (QID) | ORAL | Status: DC | PRN

## 2013-01-14 MED ORDER — AMLODIPINE BESYLATE 5 MG PO TABS: 5.0000 mg | ORAL_TABLET | Freq: Every day | ORAL | Status: DC

## 2013-01-14 MED ORDER — BASE A POLYETHYLENE GLYCOL POWD: 17.0000 g | Freq: Every day | Status: DC | PRN

## 2013-01-14 NOTE — Assessment & Plan Note (Signed)
Recurrent - years. S/p remote Urol eval 

## 2013-01-14 NOTE — Assessment & Plan Note (Signed)
Continue with current prescription therapy as reflected on the Med list.  

## 2013-01-14 NOTE — Assessment & Plan Note (Signed)
Chronic. He cont to smoke Needs to stop smoking

## 2013-01-14 NOTE — Assessment & Plan Note (Signed)
Recurrent - years. S/p remote Urol eval

## 2013-01-14 NOTE — Assessment & Plan Note (Signed)
Wt Readings from Last 3 Encounters:  01/14/13 190 lb (86.183 kg)  10/14/12 195 lb (88.451 kg)  08/12/12 191 lb (86.637 kg)

## 2013-01-14 NOTE — Progress Notes (Signed)
   Subjective:    HPI   The patient is here to follow up on chronic LBP, depression, anxiety, headaches and chronic moderate OA symptoms controlled with medicines. He gets some exercise - walking.  Wt Readings from Last 3 Encounters:  01/14/13 190 lb (86.183 kg)  10/14/12 195 lb (88.451 kg)  08/12/12 191 lb (86.637 kg)   BP Readings from Last 3 Encounters:  01/14/13 150/90  10/14/12 160/82  08/12/12 160/92     Review of Systems  Constitutional: Positive for unexpected weight change. Negative for appetite change and fatigue.  HENT: Negative for congestion, nosebleeds, sneezing, sore throat and trouble swallowing.   Eyes: Negative for itching and visual disturbance.  Respiratory: Negative for cough.   Cardiovascular: Negative for chest pain, palpitations and leg swelling.  Gastrointestinal: Negative for nausea, diarrhea, blood in stool and abdominal distention.  Genitourinary: Negative for frequency and hematuria.  Musculoskeletal: Positive for arthralgias and back pain. Negative for gait problem, joint swelling and neck pain.  Skin: Negative for rash.  Neurological: Negative for dizziness, tremors, speech difficulty and weakness.  Psychiatric/Behavioral: Positive for sleep disturbance. Negative for suicidal ideas, dysphoric mood and agitation. The patient is nervous/anxious.        Objective:   Physical Exam  Constitutional: He is oriented to person, place, and time. He appears well-developed.  obese  HENT:  Mouth/Throat: Oropharynx is clear and moist.  Eyes: Conjunctivae are normal. Pupils are equal, round, and reactive to light.  Neck: Normal range of motion. No JVD present. No thyromegaly present.  Cardiovascular: Normal rate, regular rhythm, normal heart sounds and intact distal pulses.  Exam reveals no gallop and no friction rub.   No murmur heard. Pulmonary/Chest: Effort normal and breath sounds normal. No respiratory distress. He has no wheezes. He has no rales. He  exhibits no tenderness.  Abdominal: Soft. Bowel sounds are normal. He exhibits no distension and no mass. There is no tenderness. There is no rebound and no guarding.  Musculoskeletal: Normal range of motion. He exhibits tenderness (LS is stiff and tender). He exhibits no edema.  Lymphadenopathy:    He has no cervical adenopathy.  Neurological: He is alert and oriented to person, place, and time. He has normal reflexes. No cranial nerve deficit. He exhibits normal muscle tone. Coordination normal.  Skin: Skin is warm and dry. No rash noted.  Psychiatric: He has a normal mood and affect. His behavior is normal. Judgment and thought content normal.   Lab Results  Component Value Date   WBC 7.5 09/15/2012   HGB 13.6 09/15/2012   HCT 41.4 09/15/2012   PLT 240.0 09/15/2012   GLUCOSE 96 09/15/2012   CHOL 201* 09/15/2012   TRIG 156.0* 09/15/2012   HDL 32.90* 09/15/2012   LDLDIRECT 152.8 09/15/2012   LDLCALC 127* 07/30/2011   ALT 25 09/15/2012   AST 19 09/15/2012   NA 139 09/15/2012   K 4.1 09/15/2012   CL 105 09/15/2012   CREATININE 1.0 09/15/2012   BUN 9 09/15/2012   CO2 29 09/15/2012   TSH 1.11 09/15/2012   PSA 2.09 09/15/2012   HGBA1C 5.3 06/07/2008         Assessment & Plan:

## 2013-01-14 NOTE — Assessment & Plan Note (Signed)
Resolved

## 2013-04-14 ENCOUNTER — Ambulatory Visit (INDEPENDENT_AMBULATORY_CARE_PROVIDER_SITE_OTHER): Payer: Medicare PPO | Admitting: Internal Medicine

## 2013-04-14 ENCOUNTER — Other Ambulatory Visit (INDEPENDENT_AMBULATORY_CARE_PROVIDER_SITE_OTHER): Payer: Medicare PPO

## 2013-04-14 ENCOUNTER — Encounter: Payer: Self-pay | Admitting: Internal Medicine

## 2013-04-14 VITALS — BP 120/82 | HR 72 | Temp 98.4°F | Resp 16 | Wt 193.0 lb

## 2013-04-14 DIAGNOSIS — R972 Elevated prostate specific antigen [PSA]: Secondary | ICD-10-CM

## 2013-04-14 DIAGNOSIS — R635 Abnormal weight gain: Secondary | ICD-10-CM

## 2013-04-14 DIAGNOSIS — M545 Low back pain, unspecified: Secondary | ICD-10-CM

## 2013-04-14 DIAGNOSIS — R030 Elevated blood-pressure reading, without diagnosis of hypertension: Secondary | ICD-10-CM

## 2013-04-14 LAB — URINALYSIS, ROUTINE W REFLEX MICROSCOPIC
BILIRUBIN URINE: NEGATIVE
Ketones, ur: NEGATIVE
LEUKOCYTES UA: NEGATIVE
NITRITE: NEGATIVE
PH: 6 (ref 5.0–8.0)
SPECIFIC GRAVITY, URINE: 1.025 (ref 1.000–1.030)
TOTAL PROTEIN, URINE-UPE24: NEGATIVE
Urine Glucose: NEGATIVE
Urobilinogen, UA: 0.2 (ref 0.0–1.0)

## 2013-04-14 LAB — BASIC METABOLIC PANEL
BUN: 9 mg/dL (ref 6–23)
CO2: 27 mEq/L (ref 19–32)
Calcium: 9.7 mg/dL (ref 8.4–10.5)
Chloride: 105 mEq/L (ref 96–112)
Creatinine, Ser: 0.8 mg/dL (ref 0.4–1.5)
GFR: 119.14 mL/min (ref >60.00)
Glucose, Bld: 95 mg/dL (ref 70–99)
POTASSIUM: 3.8 meq/L (ref 3.5–5.1)
Sodium: 140 mEq/L (ref 135–145)

## 2013-04-14 LAB — HEPATIC FUNCTION PANEL
ALT: 38 U/L (ref 0–53)
AST: 22 U/L (ref 0–37)
Albumin: 4.2 g/dL (ref 3.5–5.2)
Alkaline Phosphatase: 41 U/L (ref 39–117)
BILIRUBIN DIRECT: 0 mg/dL (ref 0.0–0.3)
BILIRUBIN TOTAL: 0.7 mg/dL (ref 0.3–1.2)
Total Protein: 8.1 g/dL (ref 6.0–8.3)

## 2013-04-14 LAB — TSH: TSH: 0.93 u[IU]/mL (ref 0.35–5.50)

## 2013-04-14 MED ORDER — HYDROCODONE-ACETAMINOPHEN 10-325 MG PO TABS: 1.0000 | ORAL_TABLET | Freq: Four times a day (QID) | ORAL | Status: DC | PRN

## 2013-04-14 MED ORDER — CYCLOBENZAPRINE HCL 10 MG PO TABS: 10.0000 mg | ORAL_TABLET | Freq: Three times a day (TID) | ORAL | Status: DC | PRN

## 2013-04-14 MED ORDER — TAMSULOSIN HCL 0.4 MG PO CAPS: 0.4000 mg | ORAL_CAPSULE | Freq: Every day | ORAL | Status: DC

## 2013-04-14 MED ORDER — SILDENAFIL CITRATE 100 MG PO TABS
50.0000 mg | ORAL_TABLET | ORAL | Status: DC | PRN
Start: 2013-04-14 — End: 2013-07-13

## 2013-04-14 NOTE — Progress Notes (Signed)
Pre visit review using our clinic review tool, if applicable. No additional management support is needed unless otherwise documented below in the visit note. 

## 2013-04-14 NOTE — Assessment & Plan Note (Signed)
Continue with current prescription therapy as reflected on the Med list.  

## 2013-04-14 NOTE — Progress Notes (Signed)
   Subjective:    HPI   The patient is here to follow up on chronic LBP, depression, anxiety, headaches and chronic moderate OA symptoms controlled with medicines. He gets some exercise - walking. C/o a fall last month, hurt ribs on the R.  Wt Readings from Last 3 Encounters:  04/14/13 193 lb (87.544 kg)  01/14/13 190 lb (86.183 kg)  10/14/12 195 lb (88.451 kg)   BP Readings from Last 3 Encounters:  04/14/13 120/82  01/14/13 150/90  10/14/12 160/82     Review of Systems  Constitutional: Positive for unexpected weight change. Negative for appetite change and fatigue.  HENT: Negative for congestion, nosebleeds, sneezing, sore throat and trouble swallowing.   Eyes: Negative for itching and visual disturbance.  Respiratory: Negative for cough.   Cardiovascular: Negative for chest pain, palpitations and leg swelling.  Gastrointestinal: Negative for nausea, diarrhea, blood in stool and abdominal distention.  Genitourinary: Negative for frequency and hematuria.  Musculoskeletal: Positive for arthralgias and back pain. Negative for gait problem, joint swelling and neck pain.  Skin: Negative for rash.  Neurological: Negative for dizziness, tremors, speech difficulty and weakness.  Psychiatric/Behavioral: Positive for sleep disturbance. Negative for suicidal ideas, dysphoric mood and agitation. The patient is nervous/anxious.        Objective:   Physical Exam  Constitutional: He is oriented to person, place, and time. He appears well-developed.  obese  HENT:  Mouth/Throat: Oropharynx is clear and moist.  Eyes: Conjunctivae are normal. Pupils are equal, round, and reactive to light.  Neck: Normal range of motion. No JVD present. No thyromegaly present.  Cardiovascular: Normal rate, regular rhythm, normal heart sounds and intact distal pulses.  Exam reveals no gallop and no friction rub.   No murmur heard. Pulmonary/Chest: Effort normal and breath sounds normal. No respiratory  distress. He has no wheezes. He has no rales. He exhibits no tenderness.  Abdominal: Soft. Bowel sounds are normal. He exhibits no distension and no mass. There is no tenderness. There is no rebound and no guarding.  Musculoskeletal: Normal range of motion. He exhibits tenderness (LS is stiff and tender). He exhibits no edema.  Lymphadenopathy:    He has no cervical adenopathy.  Neurological: He is alert and oriented to person, place, and time. He has normal reflexes. No cranial nerve deficit. He exhibits normal muscle tone. Coordination normal.  Skin: Skin is warm and dry. No rash noted.  Psychiatric: He has a normal mood and affect. His behavior is normal. Judgment and thought content normal.  R chest is sensitive  Lab Results  Component Value Date   WBC 7.5 09/15/2012   HGB 13.6 09/15/2012   HCT 41.4 09/15/2012   PLT 240.0 09/15/2012   GLUCOSE 96 09/15/2012   CHOL 201* 09/15/2012   TRIG 156.0* 09/15/2012   HDL 32.90* 09/15/2012   LDLDIRECT 152.8 09/15/2012   LDLCALC 127* 07/30/2011   ALT 25 09/15/2012   AST 19 09/15/2012   NA 139 09/15/2012   K 4.1 09/15/2012   CL 105 09/15/2012   CREATININE 1.0 09/15/2012   BUN 9 09/15/2012   CO2 29 09/15/2012   TSH 1.11 09/15/2012   PSA 2.09 09/15/2012   HGBA1C 5.3 06/07/2008         Assessment & Plan:

## 2013-04-14 NOTE — Assessment & Plan Note (Signed)
Discussed.

## 2013-04-14 NOTE — Assessment & Plan Note (Signed)
Labs

## 2013-04-15 LAB — PSA, TOTAL AND FREE
PSA FREE PCT: 22 % — AB (ref >25)
PSA FREE: 0.39 ng/mL
PSA: 1.75 ng/mL (ref <=4.00)

## 2013-07-13 ENCOUNTER — Ambulatory Visit (INDEPENDENT_AMBULATORY_CARE_PROVIDER_SITE_OTHER): Payer: Medicare PPO | Admitting: Internal Medicine

## 2013-07-13 ENCOUNTER — Encounter: Payer: Self-pay | Admitting: Internal Medicine

## 2013-07-13 VITALS — BP 142/90 | HR 97 | Temp 98.3°F | Ht 67.0 in | Wt 194.4 lb

## 2013-07-13 DIAGNOSIS — N529 Male erectile dysfunction, unspecified: Secondary | ICD-10-CM

## 2013-07-13 DIAGNOSIS — M545 Low back pain, unspecified: Secondary | ICD-10-CM

## 2013-07-13 DIAGNOSIS — R972 Elevated prostate specific antigen [PSA]: Secondary | ICD-10-CM

## 2013-07-13 DIAGNOSIS — R635 Abnormal weight gain: Secondary | ICD-10-CM

## 2013-07-13 DIAGNOSIS — R030 Elevated blood-pressure reading, without diagnosis of hypertension: Secondary | ICD-10-CM

## 2013-07-13 MED ORDER — SILDENAFIL CITRATE 100 MG PO TABS: 50.0000 mg | ORAL_TABLET | ORAL | Status: DC | PRN

## 2013-07-13 MED ORDER — HYDROCODONE-ACETAMINOPHEN 10-325 MG PO TABS: 1.0000 | ORAL_TABLET | Freq: Four times a day (QID) | ORAL | Status: DC | PRN

## 2013-07-13 MED ORDER — AMLODIPINE BESYLATE 5 MG PO TABS: 5.0000 mg | ORAL_TABLET | Freq: Every day | ORAL | Status: DC

## 2013-07-13 MED ORDER — CYCLOBENZAPRINE HCL 10 MG PO TABS: 10.0000 mg | ORAL_TABLET | Freq: Three times a day (TID) | ORAL | Status: DC | PRN

## 2013-07-13 MED ORDER — TAMSULOSIN HCL 0.4 MG PO CAPS: 0.4000 mg | ORAL_CAPSULE | Freq: Every day | ORAL | Status: DC

## 2013-07-13 NOTE — Progress Notes (Signed)
Pre visit review using our clinic review tool, if applicable. No additional management support is needed unless otherwise documented below in the visit note. 

## 2013-07-13 NOTE — Progress Notes (Signed)
   Subjective:    HPI   The patient is here to follow up on chronic LBP, depression, anxiety, headaches and chronic moderate OA symptoms controlled with medicines. He gets some exercise - walking.   Wt Readings from Last 3 Encounters:  07/13/13 194 lb 6 oz (88.168 kg)  04/14/13 193 lb (87.544 kg)  01/14/13 190 lb (86.183 kg)   BP Readings from Last 3 Encounters:  07/13/13 142/90  04/14/13 120/82  01/14/13 150/90     Review of Systems  Constitutional: Positive for unexpected weight change. Negative for appetite change and fatigue.  HENT: Negative for congestion, nosebleeds, sneezing, sore throat and trouble swallowing.   Eyes: Negative for itching and visual disturbance.  Respiratory: Negative for cough.   Cardiovascular: Negative for chest pain, palpitations and leg swelling.  Gastrointestinal: Negative for nausea, diarrhea, blood in stool and abdominal distention.  Genitourinary: Negative for frequency and hematuria.  Musculoskeletal: Positive for arthralgias and back pain. Negative for gait problem, joint swelling and neck pain.  Skin: Negative for rash.  Neurological: Negative for dizziness, tremors, speech difficulty and weakness.  Psychiatric/Behavioral: Positive for sleep disturbance. Negative for suicidal ideas, dysphoric mood and agitation. The patient is nervous/anxious.        Objective:   Physical Exam  Constitutional: He is oriented to person, place, and time. He appears well-developed.  obese  HENT:  Mouth/Throat: Oropharynx is clear and moist.  Eyes: Conjunctivae are normal. Pupils are equal, round, and reactive to light.  Neck: Normal range of motion. No JVD present. No thyromegaly present.  Cardiovascular: Normal rate, regular rhythm, normal heart sounds and intact distal pulses.  Exam reveals no gallop and no friction rub.   No murmur heard. Pulmonary/Chest: Effort normal and breath sounds normal. No respiratory distress. He has no wheezes. He has no  rales. He exhibits no tenderness.  Abdominal: Soft. Bowel sounds are normal. He exhibits no distension and no mass. There is no tenderness. There is no rebound and no guarding.  Musculoskeletal: Normal range of motion. He exhibits tenderness (LS is stiff and tender). He exhibits no edema.  Lymphadenopathy:    He has no cervical adenopathy.  Neurological: He is alert and oriented to person, place, and time. He has normal reflexes. No cranial nerve deficit. He exhibits normal muscle tone. Coordination normal.  Skin: Skin is warm and dry. No rash noted.  Psychiatric: He has a normal mood and affect. His behavior is normal. Judgment and thought content normal.    Lab Results  Component Value Date   WBC 7.5 09/15/2012   HGB 13.6 09/15/2012   HCT 41.4 09/15/2012   PLT 240.0 09/15/2012   GLUCOSE 95 04/14/2013   CHOL 201* 09/15/2012   TRIG 156.0* 09/15/2012   HDL 32.90* 09/15/2012   LDLDIRECT 152.8 09/15/2012   LDLCALC 127* 07/30/2011   ALT 38 04/14/2013   AST 22 04/14/2013   NA 140 04/14/2013   K 3.8 04/14/2013   CL 105 04/14/2013   CREATININE 0.8 04/14/2013   BUN 9 04/14/2013   CO2 27 04/14/2013   TSH 0.93 04/14/2013   PSA 1.75 04/14/2013   HGBA1C 5.3 06/07/2008         Assessment & Plan:

## 2013-07-13 NOTE — Assessment & Plan Note (Signed)
Continue with current prescription therapy as reflected on the Med list.  

## 2013-07-13 NOTE — Assessment & Plan Note (Signed)
Diet discussed 

## 2013-07-14 ENCOUNTER — Telehealth: Payer: Self-pay | Admitting: Internal Medicine

## 2013-07-14 NOTE — Telephone Encounter (Signed)
Relevant patient education mailed to patient.  

## 2013-10-14 ENCOUNTER — Encounter: Payer: Self-pay | Admitting: Internal Medicine

## 2013-10-14 ENCOUNTER — Ambulatory Visit (INDEPENDENT_AMBULATORY_CARE_PROVIDER_SITE_OTHER): Payer: Medicare PPO | Admitting: Internal Medicine

## 2013-10-14 VITALS — BP 122/74 | HR 73 | Temp 98.1°F | Wt 201.0 lb

## 2013-10-14 DIAGNOSIS — J4489 Other specified chronic obstructive pulmonary disease: Secondary | ICD-10-CM

## 2013-10-14 DIAGNOSIS — M545 Low back pain, unspecified: Secondary | ICD-10-CM

## 2013-10-14 DIAGNOSIS — R030 Elevated blood-pressure reading, without diagnosis of hypertension: Secondary | ICD-10-CM

## 2013-10-14 DIAGNOSIS — R635 Abnormal weight gain: Secondary | ICD-10-CM

## 2013-10-14 DIAGNOSIS — R972 Elevated prostate specific antigen [PSA]: Secondary | ICD-10-CM

## 2013-10-14 DIAGNOSIS — J449 Chronic obstructive pulmonary disease, unspecified: Secondary | ICD-10-CM

## 2013-10-14 MED ORDER — HYDROCODONE-ACETAMINOPHEN 10-325 MG PO TABS: 1.0000 | ORAL_TABLET | Freq: Four times a day (QID) | ORAL | Status: DC | PRN

## 2013-10-14 MED ORDER — AMLODIPINE BESYLATE 5 MG PO TABS: 5.0000 mg | ORAL_TABLET | Freq: Every day | ORAL | Status: DC

## 2013-10-14 MED ORDER — TAMSULOSIN HCL 0.4 MG PO CAPS: 0.4000 mg | ORAL_CAPSULE | Freq: Every day | ORAL | Status: DC

## 2013-10-14 NOTE — Assessment & Plan Note (Addendum)
Monitor PSA - pt declined labs, DRE today

## 2013-10-14 NOTE — Assessment & Plan Note (Signed)
He cut down

## 2013-10-14 NOTE — Assessment & Plan Note (Signed)
Discussed diet  

## 2013-10-14 NOTE — Progress Notes (Signed)
Pre visit review using our clinic review tool, if applicable. No additional management support is needed unless otherwise documented below in the visit note. 

## 2013-10-14 NOTE — Assessment & Plan Note (Signed)
Continue with current prescription therapy as reflected on the Med list.  

## 2013-10-14 NOTE — Progress Notes (Signed)
   Subjective:    HPI   The patient is here to follow up on chronic LBP, depression, anxiety, headaches and chronic moderate OA symptoms controlled with medicines. He gets some exercise - walking. However, walking less in the heat...   Wt Readings from Last 3 Encounters:  10/14/13 201 lb (91.173 kg)  07/13/13 194 lb 6 oz (88.168 kg)  04/14/13 193 lb (87.544 kg)   BP Readings from Last 3 Encounters:  10/14/13 122/74  07/13/13 142/90  04/14/13 120/82     Review of Systems  Constitutional: Positive for unexpected weight change. Negative for appetite change and fatigue.  HENT: Negative for congestion, nosebleeds, sneezing, sore throat and trouble swallowing.   Eyes: Negative for itching and visual disturbance.  Respiratory: Negative for cough.   Cardiovascular: Negative for chest pain, palpitations and leg swelling.  Gastrointestinal: Negative for nausea, diarrhea, blood in stool and abdominal distention.  Genitourinary: Negative for frequency and hematuria.  Musculoskeletal: Positive for arthralgias and back pain. Negative for gait problem, joint swelling and neck pain.  Skin: Negative for rash.  Neurological: Negative for dizziness, tremors, speech difficulty and weakness.  Psychiatric/Behavioral: Positive for sleep disturbance. Negative for suicidal ideas, dysphoric mood and agitation. The patient is nervous/anxious.        Objective:   Physical Exam  Constitutional: He is oriented to person, place, and time. He appears well-developed.  obese  HENT:  Mouth/Throat: Oropharynx is clear and moist.  Eyes: Conjunctivae are normal. Pupils are equal, round, and reactive to light.  Neck: Normal range of motion. No JVD present. No thyromegaly present.  Cardiovascular: Normal rate, regular rhythm, normal heart sounds and intact distal pulses.  Exam reveals no gallop and no friction rub.   No murmur heard. Pulmonary/Chest: Effort normal and breath sounds normal. No respiratory  distress. He has no wheezes. He has no rales. He exhibits no tenderness.  Abdominal: Soft. Bowel sounds are normal. He exhibits no distension and no mass. There is no tenderness. There is no rebound and no guarding.  Musculoskeletal: Normal range of motion. He exhibits tenderness (LS is stiff and tender). He exhibits no edema.  Lymphadenopathy:    He has no cervical adenopathy.  Neurological: He is alert and oriented to person, place, and time. He has normal reflexes. No cranial nerve deficit. He exhibits normal muscle tone. Coordination normal.  Skin: Skin is warm and dry. No rash noted.  Psychiatric: He has a normal mood and affect. His behavior is normal. Judgment and thought content normal.    Lab Results  Component Value Date   WBC 7.5 09/15/2012   HGB 13.6 09/15/2012   HCT 41.4 09/15/2012   PLT 240.0 09/15/2012   GLUCOSE 95 04/14/2013   CHOL 201* 09/15/2012   TRIG 156.0* 09/15/2012   HDL 32.90* 09/15/2012   LDLDIRECT 152.8 09/15/2012   LDLCALC 127* 07/30/2011   ALT 38 04/14/2013   AST 22 04/14/2013   NA 140 04/14/2013   K 3.8 04/14/2013   CL 105 04/14/2013   CREATININE 0.8 04/14/2013   BUN 9 04/14/2013   CO2 27 04/14/2013   TSH 0.93 04/14/2013   PSA 1.75 04/14/2013   HGBA1C 5.3 06/07/2008         Assessment & Plan:

## 2014-01-14 ENCOUNTER — Encounter: Payer: Self-pay | Admitting: Internal Medicine

## 2014-01-14 ENCOUNTER — Ambulatory Visit (INDEPENDENT_AMBULATORY_CARE_PROVIDER_SITE_OTHER): Payer: Medicare PPO | Admitting: Internal Medicine

## 2014-01-14 ENCOUNTER — Other Ambulatory Visit: Payer: Self-pay | Admitting: Internal Medicine

## 2014-01-14 VITALS — BP 130/80 | HR 99 | Temp 98.5°F | Wt 198.0 lb

## 2014-01-14 DIAGNOSIS — R030 Elevated blood-pressure reading, without diagnosis of hypertension: Secondary | ICD-10-CM

## 2014-01-14 DIAGNOSIS — M545 Low back pain, unspecified: Secondary | ICD-10-CM

## 2014-01-14 DIAGNOSIS — Z23 Encounter for immunization: Secondary | ICD-10-CM

## 2014-01-14 DIAGNOSIS — R635 Abnormal weight gain: Secondary | ICD-10-CM

## 2014-01-14 DIAGNOSIS — R972 Elevated prostate specific antigen [PSA]: Secondary | ICD-10-CM

## 2014-01-14 MED ORDER — TAMSULOSIN HCL 0.4 MG PO CAPS: 0.4000 mg | ORAL_CAPSULE | Freq: Every day | ORAL | Status: DC

## 2014-01-14 MED ORDER — AMLODIPINE BESYLATE 5 MG PO TABS: 5.0000 mg | ORAL_TABLET | Freq: Every day | ORAL | Status: DC

## 2014-01-14 MED ORDER — CYCLOBENZAPRINE HCL 10 MG PO TABS
10.0000 mg | ORAL_TABLET | Freq: Three times a day (TID) | ORAL | Status: DC | PRN
Start: 2014-01-14 — End: 2014-04-16

## 2014-01-14 MED ORDER — HYDROCODONE-ACETAMINOPHEN 10-325 MG PO TABS: 1.0000 | ORAL_TABLET | Freq: Four times a day (QID) | ORAL | Status: DC | PRN

## 2014-01-14 MED ORDER — SILDENAFIL CITRATE 100 MG PO TABS: 50.0000 mg | ORAL_TABLET | ORAL | Status: DC | PRN

## 2014-01-14 NOTE — Assessment & Plan Note (Signed)
Chronic. OA.  Potential benefits of a long term opioids use as well as potential risks (i.e. addiction risk, apnea etc) and complications (i.e. Somnolence, constipation and others) were explained to the patient and were aknowledged.  Continue with current prn prescription therapy as reflected on the Med list.

## 2014-01-14 NOTE — Progress Notes (Signed)
   Subjective:    HPI   The patient is here to follow up on chronic LBP, depression, anxiety, headaches and chronic moderate OA symptoms controlled with medicines. He gets some exercise - walking. However, walking less in the heat...   Wt Readings from Last 3 Encounters:  01/14/14 198 lb (89.812 kg)  10/14/13 201 lb (91.173 kg)  07/13/13 194 lb 6 oz (88.168 kg)   BP Readings from Last 3 Encounters:  01/14/14 130/80  10/14/13 122/74  07/13/13 142/90     Review of Systems  Constitutional: Positive for unexpected weight change. Negative for appetite change and fatigue.  HENT: Negative for congestion, nosebleeds, sneezing, sore throat and trouble swallowing.   Eyes: Negative for itching and visual disturbance.  Respiratory: Negative for cough.   Cardiovascular: Negative for chest pain, palpitations and leg swelling.  Gastrointestinal: Negative for nausea, diarrhea, blood in stool and abdominal distention.  Genitourinary: Negative for frequency and hematuria.  Musculoskeletal: Positive for back pain and arthralgias. Negative for joint swelling, gait problem and neck pain.  Skin: Negative for rash.  Neurological: Negative for dizziness, tremors, speech difficulty and weakness.  Psychiatric/Behavioral: Positive for sleep disturbance. Negative for suicidal ideas, dysphoric mood and agitation. The patient is nervous/anxious.        Objective:   Physical Exam  Constitutional: He is oriented to person, place, and time. He appears well-developed. No distress.  NAD  HENT:  Mouth/Throat: Oropharynx is clear and moist.  Eyes: Conjunctivae are normal. Pupils are equal, round, and reactive to light.  Neck: Normal range of motion. No JVD present. No thyromegaly present.  Cardiovascular: Normal rate, regular rhythm, normal heart sounds and intact distal pulses.  Exam reveals no gallop and no friction rub.   No murmur heard. Pulmonary/Chest: Effort normal and breath sounds normal. No  respiratory distress. He has no wheezes. He has no rales. He exhibits no tenderness.  Abdominal: Soft. Bowel sounds are normal. He exhibits no distension and no mass. There is no tenderness. There is no rebound and no guarding.  Musculoskeletal: Normal range of motion. He exhibits no edema or tenderness.  Lymphadenopathy:    He has no cervical adenopathy.  Neurological: He is alert and oriented to person, place, and time. He has normal reflexes. No cranial nerve deficit. He exhibits normal muscle tone. He displays a negative Romberg sign. Coordination and gait normal.  No meningeal signs  Skin: Skin is warm and dry. No rash noted.  Psychiatric: He has a normal mood and affect. His behavior is normal. Judgment and thought content normal.  Obese LS is tender w/ROM  Lab Results  Component Value Date   WBC 7.5 09/15/2012   HGB 13.6 09/15/2012   HCT 41.4 09/15/2012   PLT 240.0 09/15/2012   GLUCOSE 95 04/14/2013   CHOL 201* 09/15/2012   TRIG 156.0* 09/15/2012   HDL 32.90* 09/15/2012   LDLDIRECT 152.8 09/15/2012   LDLCALC 127* 07/30/2011   ALT 38 04/14/2013   AST 22 04/14/2013   NA 140 04/14/2013   K 3.8 04/14/2013   CL 105 04/14/2013   CREATININE 0.8 04/14/2013   BUN 9 04/14/2013   CO2 27 04/14/2013   TSH 0.93 04/14/2013   PSA 1.75 04/14/2013   HGBA1C 5.3 06/07/2008         Assessment & Plan:

## 2014-01-14 NOTE — Assessment & Plan Note (Signed)
Wt Readings from Last 3 Encounters:  01/14/14 198 lb (89.812 kg)  10/14/13 201 lb (91.173 kg)  07/13/13 194 lb 6 oz (88.168 kg)

## 2014-01-14 NOTE — Progress Notes (Signed)
Pre visit review using our clinic review tool, if applicable. No additional management support is needed unless otherwise documented below in the visit note. 

## 2014-03-19 ENCOUNTER — Other Ambulatory Visit: Payer: Self-pay | Admitting: Internal Medicine

## 2014-04-16 ENCOUNTER — Encounter: Payer: Self-pay | Admitting: Internal Medicine

## 2014-04-16 ENCOUNTER — Ambulatory Visit (INDEPENDENT_AMBULATORY_CARE_PROVIDER_SITE_OTHER): Payer: Medicare PPO | Admitting: Internal Medicine

## 2014-04-16 VITALS — BP 160/84 | HR 106 | Temp 98.7°F | Wt 196.0 lb

## 2014-04-16 DIAGNOSIS — Z23 Encounter for immunization: Secondary | ICD-10-CM

## 2014-04-16 DIAGNOSIS — R030 Elevated blood-pressure reading, without diagnosis of hypertension: Secondary | ICD-10-CM

## 2014-04-16 DIAGNOSIS — M545 Low back pain, unspecified: Secondary | ICD-10-CM

## 2014-04-16 DIAGNOSIS — Z720 Tobacco use: Secondary | ICD-10-CM

## 2014-04-16 DIAGNOSIS — R972 Elevated prostate specific antigen [PSA]: Secondary | ICD-10-CM

## 2014-04-16 DIAGNOSIS — R635 Abnormal weight gain: Secondary | ICD-10-CM

## 2014-04-16 MED ORDER — CYCLOBENZAPRINE HCL 10 MG PO TABS: 10.0000 mg | ORAL_TABLET | Freq: Three times a day (TID) | ORAL | Status: DC | PRN

## 2014-04-16 MED ORDER — HYDROCODONE-ACETAMINOPHEN 10-325 MG PO TABS: 1.0000 | ORAL_TABLET | Freq: Four times a day (QID) | ORAL | Status: DC | PRN

## 2014-04-16 MED ORDER — SILDENAFIL CITRATE 100 MG PO TABS: 50.0000 mg | ORAL_TABLET | ORAL | Status: DC | PRN

## 2014-04-16 MED ORDER — TAMSULOSIN HCL 0.4 MG PO CAPS: 0.4000 mg | ORAL_CAPSULE | Freq: Every day | ORAL | Status: DC

## 2014-04-16 MED ORDER — AMLODIPINE BESYLATE 5 MG PO TABS: 5.0000 mg | ORAL_TABLET | Freq: Every day | ORAL | Status: DC

## 2014-04-16 NOTE — Assessment & Plan Note (Signed)
Discussed harms

## 2014-04-16 NOTE — Progress Notes (Signed)
   Subjective:    HPI   The patient is here to follow up on chronic LBP, depression, anxiety, headaches and chronic moderate OA symptoms controlled with medicines. He gets some exercise - walking.    Wt Readings from Last 3 Encounters:  04/16/14 196 lb (88.905 kg)  01/14/14 198 lb (89.812 kg)  10/14/13 201 lb (91.173 kg)   BP Readings from Last 3 Encounters:  04/16/14 160/84  01/14/14 130/80  10/14/13 122/74     Review of Systems  Constitutional: Positive for unexpected weight change. Negative for appetite change and fatigue.  HENT: Negative for congestion, nosebleeds, sneezing, sore throat and trouble swallowing.   Eyes: Negative for itching and visual disturbance.  Respiratory: Negative for cough.   Cardiovascular: Negative for chest pain, palpitations and leg swelling.  Gastrointestinal: Negative for nausea, diarrhea, blood in stool and abdominal distention.  Genitourinary: Negative for frequency and hematuria.  Musculoskeletal: Positive for back pain and arthralgias. Negative for joint swelling, gait problem and neck pain.  Skin: Negative for rash.  Neurological: Negative for dizziness, tremors, speech difficulty and weakness.  Psychiatric/Behavioral: Positive for sleep disturbance. Negative for suicidal ideas, dysphoric mood and agitation. The patient is nervous/anxious.        Objective:   Physical Exam  Constitutional: He is oriented to person, place, and time. He appears well-developed. No distress.  NAD  HENT:  Mouth/Throat: Oropharynx is clear and moist.  Eyes: Conjunctivae are normal. Pupils are equal, round, and reactive to light.  Neck: Normal range of motion. No JVD present. No thyromegaly present.  Cardiovascular: Normal rate, regular rhythm, normal heart sounds and intact distal pulses.  Exam reveals no gallop and no friction rub.   No murmur heard. Pulmonary/Chest: Effort normal and breath sounds normal. No respiratory distress. He has no wheezes. He  has no rales. He exhibits no tenderness.  Abdominal: Soft. Bowel sounds are normal. He exhibits no distension and no mass. There is no tenderness. There is no rebound and no guarding.  Musculoskeletal: Normal range of motion. He exhibits no edema or tenderness.  Lymphadenopathy:    He has no cervical adenopathy.  Neurological: He is alert and oriented to person, place, and time. He has normal reflexes. No cranial nerve deficit. He exhibits normal muscle tone. He displays a negative Romberg sign. Coordination and gait normal.  No meningeal signs  Skin: Skin is warm and dry. No rash noted.  Psychiatric: He has a normal mood and affect. His behavior is normal. Judgment and thought content normal.  Obese LS is tender w/ROM  Lab Results  Component Value Date   WBC 7.5 09/15/2012   HGB 13.6 09/15/2012   HCT 41.4 09/15/2012   PLT 240.0 09/15/2012   GLUCOSE 95 04/14/2013   CHOL 201* 09/15/2012   TRIG 156.0* 09/15/2012   HDL 32.90* 09/15/2012   LDLDIRECT 152.8 09/15/2012   LDLCALC 127* 07/30/2011   ALT 38 04/14/2013   AST 22 04/14/2013   NA 140 04/14/2013   K 3.8 04/14/2013   CL 105 04/14/2013   CREATININE 0.8 04/14/2013   BUN 9 04/14/2013   CO2 27 04/14/2013   TSH 0.93 04/14/2013   PSA 1.75 04/14/2013   HGBA1C 5.3 06/07/2008         Assessment & Plan:

## 2014-04-16 NOTE — Assessment & Plan Note (Signed)
Continue with current prescription therapy as reflected on the Med list.  Potential benefits of a long term opioids use as well as potential risks (i.e. addiction risk, apnea etc) and complications (i.e. Somnolence, constipation and others) were explained to the patient and were aknowledged.  

## 2014-04-16 NOTE — Assessment & Plan Note (Signed)
Labs  Continue with current prescription therapy as reflected on the Med list.  

## 2014-04-16 NOTE — Progress Notes (Signed)
Pre visit review using our clinic review tool, if applicable. No additional management support is needed unless otherwise documented below in the visit note. 

## 2014-04-16 NOTE — Assessment & Plan Note (Signed)
Wt Readings from Last 3 Encounters:  04/16/14 196 lb (88.905 kg)  01/14/14 198 lb (89.812 kg)  10/14/13 201 lb (91.173 kg)

## 2014-04-16 NOTE — Assessment & Plan Note (Signed)
Good BP at home 

## 2014-07-15 ENCOUNTER — Encounter: Payer: Self-pay | Admitting: Internal Medicine

## 2014-07-15 ENCOUNTER — Ambulatory Visit (INDEPENDENT_AMBULATORY_CARE_PROVIDER_SITE_OTHER): Payer: Medicare PPO | Admitting: Internal Medicine

## 2014-07-15 VITALS — BP 130/78 | HR 92 | Wt 199.0 lb

## 2014-07-15 DIAGNOSIS — R635 Abnormal weight gain: Secondary | ICD-10-CM | POA: Diagnosis not present

## 2014-07-15 DIAGNOSIS — M545 Low back pain, unspecified: Secondary | ICD-10-CM

## 2014-07-15 DIAGNOSIS — R030 Elevated blood-pressure reading, without diagnosis of hypertension: Secondary | ICD-10-CM

## 2014-07-15 DIAGNOSIS — R972 Elevated prostate specific antigen [PSA]: Secondary | ICD-10-CM | POA: Diagnosis not present

## 2014-07-15 DIAGNOSIS — N528 Other male erectile dysfunction: Secondary | ICD-10-CM

## 2014-07-15 DIAGNOSIS — N529 Male erectile dysfunction, unspecified: Secondary | ICD-10-CM

## 2014-07-15 DIAGNOSIS — R7309 Other abnormal glucose: Secondary | ICD-10-CM

## 2014-07-15 DIAGNOSIS — E785 Hyperlipidemia, unspecified: Secondary | ICD-10-CM

## 2014-07-15 MED ORDER — SILDENAFIL CITRATE 100 MG PO TABS: 50.0000 mg | ORAL_TABLET | ORAL | Status: DC | PRN

## 2014-07-15 MED ORDER — HYDROCODONE-ACETAMINOPHEN 10-325 MG PO TABS: 1.0000 | ORAL_TABLET | Freq: Four times a day (QID) | ORAL | Status: DC | PRN

## 2014-07-15 MED ORDER — CYCLOBENZAPRINE HCL 10 MG PO TABS: 10.0000 mg | ORAL_TABLET | Freq: Three times a day (TID) | ORAL | Status: DC | PRN

## 2014-07-15 MED ORDER — POLYETHYLENE GLYCOL 3350 17 GM/SCOOP PO POWD: ORAL | Status: DC

## 2014-07-15 NOTE — Progress Notes (Signed)
Pre visit review using our clinic review tool, if applicable. No additional management support is needed unless otherwise documented below in the visit note. 

## 2014-07-15 NOTE — Assessment & Plan Note (Signed)
Chronic OA Norco prn Potential benefits of a long term opioids use as well as potential risks (i.e. addiction risk, apnea etc) and complications (i.e. Somnolence, constipation and others) were explained to the patient and were aknowledged. 

## 2014-07-15 NOTE — Assessment & Plan Note (Signed)
Chronic, organic Viagra prn

## 2014-07-15 NOTE — Assessment & Plan Note (Signed)
Wt Readings from Last 3 Encounters:  07/15/14 199 lb (90.266 kg)  04/16/14 196 lb (88.905 kg)  01/14/14 198 lb (89.812 kg)  Needs better diet

## 2014-07-15 NOTE — Progress Notes (Signed)
   Subjective:    HPI   The patient is here to follow up on chronic LBP, depression, anxiety, headaches and chronic moderate OA symptoms controlled with medicines. He gets some exercise - walking.    Wt Readings from Last 3 Encounters:  07/15/14 199 lb (90.266 kg)  04/16/14 196 lb (88.905 kg)  01/14/14 198 lb (89.812 kg)   BP Readings from Last 3 Encounters:  07/15/14 130/78  04/16/14 160/84  01/14/14 130/80     Review of Systems  Constitutional: Positive for unexpected weight change. Negative for appetite change and fatigue.  HENT: Negative for congestion, nosebleeds, sneezing, sore throat and trouble swallowing.   Eyes: Negative for itching and visual disturbance.  Respiratory: Negative for cough.   Cardiovascular: Negative for chest pain, palpitations and leg swelling.  Gastrointestinal: Negative for nausea, diarrhea, blood in stool and abdominal distention.  Genitourinary: Negative for frequency and hematuria.  Musculoskeletal: Positive for back pain and arthralgias. Negative for joint swelling, gait problem and neck pain.  Skin: Negative for rash.  Neurological: Negative for dizziness, tremors, speech difficulty and weakness.  Psychiatric/Behavioral: Positive for sleep disturbance. Negative for suicidal ideas, dysphoric mood and agitation. The patient is nervous/anxious.        Objective:   Physical Exam  Constitutional: He is oriented to person, place, and time. He appears well-developed. No distress.  NAD  HENT:  Mouth/Throat: Oropharynx is clear and moist.  Eyes: Conjunctivae are normal. Pupils are equal, round, and reactive to light.  Neck: Normal range of motion. No JVD present. No thyromegaly present.  Cardiovascular: Normal rate, regular rhythm, normal heart sounds and intact distal pulses.  Exam reveals no gallop and no friction rub.   No murmur heard. Pulmonary/Chest: Effort normal and breath sounds normal. No respiratory distress. He has no wheezes. He  has no rales. He exhibits no tenderness.  Abdominal: Soft. Bowel sounds are normal. He exhibits no distension and no mass. There is no tenderness. There is no rebound and no guarding.  Musculoskeletal: Normal range of motion. He exhibits no edema or tenderness.  Lymphadenopathy:    He has no cervical adenopathy.  Neurological: He is alert and oriented to person, place, and time. He has normal reflexes. No cranial nerve deficit. He exhibits normal muscle tone. He displays a negative Romberg sign. Coordination and gait normal.  No meningeal signs  Skin: Skin is warm and dry. No rash noted.  Psychiatric: He has a normal mood and affect. His behavior is normal. Judgment and thought content normal.  Obese LS is tender w/ROM  Lab Results  Component Value Date   WBC 7.5 09/15/2012   HGB 13.6 09/15/2012   HCT 41.4 09/15/2012   PLT 240.0 09/15/2012   GLUCOSE 95 04/14/2013   CHOL 201* 09/15/2012   TRIG 156.0* 09/15/2012   HDL 32.90* 09/15/2012   LDLDIRECT 152.8 09/15/2012   LDLCALC 127* 07/30/2011   ALT 38 04/14/2013   AST 22 04/14/2013   NA 140 04/14/2013   K 3.8 04/14/2013   CL 105 04/14/2013   CREATININE 0.8 04/14/2013   BUN 9 04/14/2013   CO2 27 04/14/2013   TSH 0.93 04/14/2013   PSA 1.75 04/14/2013   HGBA1C 5.3 06/07/2008         Assessment & Plan:

## 2014-07-15 NOTE — Assessment & Plan Note (Signed)
Monitor PSA 

## 2014-07-15 NOTE — Assessment & Plan Note (Signed)
Declined statins. 

## 2014-10-04 ENCOUNTER — Other Ambulatory Visit: Payer: Self-pay | Admitting: Internal Medicine

## 2014-10-04 ENCOUNTER — Other Ambulatory Visit (INDEPENDENT_AMBULATORY_CARE_PROVIDER_SITE_OTHER): Payer: Medicare PPO

## 2014-10-04 DIAGNOSIS — R972 Elevated prostate specific antigen [PSA]: Secondary | ICD-10-CM

## 2014-10-04 DIAGNOSIS — R030 Elevated blood-pressure reading, without diagnosis of hypertension: Secondary | ICD-10-CM | POA: Diagnosis not present

## 2014-10-04 DIAGNOSIS — M545 Low back pain, unspecified: Secondary | ICD-10-CM

## 2014-10-04 DIAGNOSIS — N528 Other male erectile dysfunction: Secondary | ICD-10-CM | POA: Diagnosis not present

## 2014-10-04 DIAGNOSIS — R635 Abnormal weight gain: Secondary | ICD-10-CM | POA: Diagnosis not present

## 2014-10-04 DIAGNOSIS — N529 Male erectile dysfunction, unspecified: Secondary | ICD-10-CM | POA: Diagnosis not present

## 2014-10-04 DIAGNOSIS — R7309 Other abnormal glucose: Secondary | ICD-10-CM

## 2014-10-04 DIAGNOSIS — E785 Hyperlipidemia, unspecified: Secondary | ICD-10-CM

## 2014-10-04 LAB — HEMOGLOBIN A1C: HEMOGLOBIN A1C: 4.9 % (ref 4.6–6.5)

## 2014-10-04 LAB — LIPID PANEL
CHOLESTEROL: 182 mg/dL (ref 0–200)
HDL: 28 mg/dL — AB (ref >39.00)
LDL CALC: 131 mg/dL — AB (ref 0–99)
NonHDL: 154
Total CHOL/HDL Ratio: 7
Triglycerides: 116 mg/dL (ref 0.0–149.0)
VLDL: 23.2 mg/dL (ref 0.0–40.0)

## 2014-10-04 LAB — URINALYSIS, ROUTINE W REFLEX MICROSCOPIC
Bilirubin Urine: NEGATIVE
Ketones, ur: NEGATIVE
Nitrite: NEGATIVE
PH: 6 (ref 5.0–8.0)
SPECIFIC GRAVITY, URINE: 1.02 (ref 1.000–1.030)
Total Protein, Urine: NEGATIVE
Urine Glucose: NEGATIVE
Urobilinogen, UA: 0.2 (ref 0.0–1.0)

## 2014-10-04 LAB — CBC WITH DIFFERENTIAL/PLATELET
BASOS ABS: 0.2 10*3/uL — AB (ref 0.0–0.1)
Basophils Relative: 2.2 % (ref 0.0–3.0)
EOS ABS: 0.3 10*3/uL (ref 0.0–0.7)
Eosinophils Relative: 3.6 % (ref 0.0–5.0)
HCT: 43.6 % (ref 39.0–52.0)
HEMOGLOBIN: 14.2 g/dL (ref 13.0–17.0)
Lymphocytes Relative: 51.2 % — ABNORMAL HIGH (ref 12.0–46.0)
Lymphs Abs: 3.6 10*3/uL (ref 0.7–4.0)
MCHC: 32.6 g/dL (ref 30.0–36.0)
MCV: 90.2 fl (ref 78.0–100.0)
Monocytes Absolute: 0.8 10*3/uL (ref 0.1–1.0)
Monocytes Relative: 11.4 % (ref 3.0–12.0)
Neutro Abs: 2.2 10*3/uL (ref 1.4–7.7)
Neutrophils Relative %: 31.6 % — ABNORMAL LOW (ref 43.0–77.0)
Platelets: 242 10*3/uL (ref 150.0–400.0)
RBC: 4.83 Mil/uL (ref 4.22–5.81)
RDW: 13.7 % (ref 11.5–15.5)
WBC: 7.1 10*3/uL (ref 4.0–10.5)

## 2014-10-04 LAB — HEPATIC FUNCTION PANEL
ALBUMIN: 4.2 g/dL (ref 3.5–5.2)
ALK PHOS: 33 U/L — AB (ref 39–117)
ALT: 18 U/L (ref 0–53)
AST: 17 U/L (ref 0–37)
BILIRUBIN TOTAL: 0.5 mg/dL (ref 0.2–1.2)
Bilirubin, Direct: 0.1 mg/dL (ref 0.0–0.3)
Total Protein: 7.3 g/dL (ref 6.0–8.3)

## 2014-10-04 LAB — BASIC METABOLIC PANEL
BUN: 9 mg/dL (ref 6–23)
CALCIUM: 9.5 mg/dL (ref 8.4–10.5)
CHLORIDE: 104 meq/L (ref 96–112)
CO2: 27 mEq/L (ref 19–32)
CREATININE: 1.02 mg/dL (ref 0.40–1.50)
GFR: 94.77 mL/min (ref >60.00)
Glucose, Bld: 92 mg/dL (ref 70–99)
Potassium: 3.7 mEq/L (ref 3.5–5.1)
Sodium: 139 mEq/L (ref 135–145)

## 2014-10-04 LAB — TSH: TSH: 1.26 u[IU]/mL (ref 0.35–4.50)

## 2014-10-04 MED ORDER — CIPROFLOXACIN HCL 500 MG PO TABS: 500.0000 mg | ORAL_TABLET | Freq: Two times a day (BID) | ORAL | Status: DC

## 2014-10-05 LAB — PSA, TOTAL AND FREE
PSA FREE: 0.97 ng/mL
PSA, Free Pct: 82 % (ref >25)
PSA: 1.19 ng/mL (ref <=4.00)

## 2014-10-08 ENCOUNTER — Ambulatory Visit (INDEPENDENT_AMBULATORY_CARE_PROVIDER_SITE_OTHER): Payer: Medicare PPO | Admitting: Internal Medicine

## 2014-10-08 ENCOUNTER — Encounter: Payer: Self-pay | Admitting: Internal Medicine

## 2014-10-08 VITALS — BP 120/80 | HR 98 | Wt 195.0 lb

## 2014-10-08 DIAGNOSIS — M545 Low back pain, unspecified: Secondary | ICD-10-CM

## 2014-10-08 DIAGNOSIS — R972 Elevated prostate specific antigen [PSA]: Secondary | ICD-10-CM

## 2014-10-08 DIAGNOSIS — N390 Urinary tract infection, site not specified: Secondary | ICD-10-CM

## 2014-10-08 DIAGNOSIS — Z0000 Encounter for general adult medical examination without abnormal findings: Secondary | ICD-10-CM | POA: Diagnosis not present

## 2014-10-08 DIAGNOSIS — R030 Elevated blood-pressure reading, without diagnosis of hypertension: Secondary | ICD-10-CM

## 2014-10-08 DIAGNOSIS — R635 Abnormal weight gain: Secondary | ICD-10-CM

## 2014-10-08 MED ORDER — HYDROCODONE-ACETAMINOPHEN 10-325 MG PO TABS: 1.0000 | ORAL_TABLET | Freq: Four times a day (QID) | ORAL | Status: DC | PRN

## 2014-10-08 MED ORDER — CYCLOBENZAPRINE HCL 10 MG PO TABS: 10.0000 mg | ORAL_TABLET | Freq: Three times a day (TID) | ORAL | Status: DC | PRN

## 2014-10-08 MED ORDER — SILDENAFIL CITRATE 100 MG PO TABS
50.0000 mg | ORAL_TABLET | ORAL | Status: DC | PRN
Start: 2014-10-08 — End: 2015-01-13

## 2014-10-08 MED ORDER — TAMSULOSIN HCL 0.4 MG PO CAPS: 0.4000 mg | ORAL_CAPSULE | Freq: Every day | ORAL | Status: DC

## 2014-10-08 NOTE — Assessment & Plan Note (Signed)
Here for medicare wellness/physical  Diet: heart healthy  Physical activity: not sedentary  Depression/mood screen: negative  Hearing: intact to whispered voice  Visual acuity: grossly normal, performs annual eye exam  ADLs: capable  Fall risk: none  Home safety: good  Cognitive evaluation: intact to orientation, naming, recall and repetition  EOL planning: adv directives, full code/ I agree  I have personally reviewed and have noted  1. The patient's medical and social history  2. Their use of alcohol, tobacco or illicit drugs  3. Their current medications and supplements  4. The patient's functional ability including ADL's, fall risks, home safety risks and hearing or visual impairment.  5. Diet and physical activities  6. Evidence for depression or mood disorders 7. Provider's roster revewed    Today patient counseled on age appropriate routine health concerns for screening and prevention, each reviewed and up to date or declined. Immunizations reviewed and up to date or declined. Labs ordered and reviewed. Risk factors for depression reviewed and negative. Hearing function and visual acuity are intact. ADLs screened and addressed as needed. Functional ability and level of safety reviewed and appropriate. Education, counseling and referrals performed based on assessed risks today. Patient provided with a copy of personalized plan for preventive services.      He declined colonoscopy, Zostavax

## 2014-10-08 NOTE — Assessment & Plan Note (Signed)
Wt Readings from Last 3 Encounters:  10/08/14 195 lb (88.451 kg)  07/15/14 199 lb (90.266 kg)  04/16/14 196 lb (88.905 kg)

## 2014-10-08 NOTE — Assessment & Plan Note (Signed)
Chronic OA Norco prn Potential benefits of a long term opioids use as well as potential risks (i.e. addiction risk, apnea etc) and complications (i.e. Somnolence, constipation and others) were explained to the patient and were aknowledged. 

## 2014-10-08 NOTE — Patient Instructions (Signed)
Preventive Care for Adults A healthy lifestyle and preventive care can promote health and wellness. Preventive health guidelines for men include the following key practices:  A routine yearly physical is a good way to check with your health care provider about your health and preventative screening. It is a chance to share any concerns and updates on your health and to receive a thorough exam.  Visit your dentist for a routine exam and preventative care every 6 months. Brush your teeth twice a day and floss once a day. Good oral hygiene prevents tooth decay and gum disease.  The frequency of eye exams is based on your age, health, family medical history, use of contact lenses, and other factors. Follow your health care provider's recommendations for frequency of eye exams.  Eat a healthy diet. Foods such as vegetables, fruits, whole grains, low-fat dairy products, and lean protein foods contain the nutrients you need without too many calories. Decrease your intake of foods high in solid fats, added sugars, and salt. Eat the right amount of calories for you.Get information about a proper diet from your health care provider, if necessary.  Regular physical exercise is one of the most important things you can do for your health. Most adults should get at least 150 minutes of moderate-intensity exercise (any activity that increases your heart rate and causes you to sweat) each week. In addition, most adults need muscle-strengthening exercises on 2 or more days a week.  Maintain a healthy weight. The body mass index (BMI) is a screening tool to identify possible weight problems. It provides an estimate of body fat based on height and weight. Your health care provider can find your BMI and can help you achieve or maintain a healthy weight.For adults 20 years and older:  A BMI below 18.5 is considered underweight.  A BMI of 18.5 to 24.9 is normal.  A BMI of 25 to 29.9 is considered overweight.  A BMI  of 30 and above is considered obese.  Maintain normal blood lipids and cholesterol levels by exercising and minimizing your intake of saturated fat. Eat a balanced diet with plenty of fruit and vegetables. Blood tests for lipids and cholesterol should begin at age 50 and be repeated every 5 years. If your lipid or cholesterol levels are high, you are over 50, or you are at high risk for heart disease, you may need your cholesterol levels checked more frequently.Ongoing high lipid and cholesterol levels should be treated with medicines if diet and exercise are not working.  If you smoke, find out from your health care provider how to quit. If you do not use tobacco, do not start.  Lung cancer screening is recommended for adults aged 73-80 years who are at high risk for developing lung cancer because of a history of smoking. A yearly low-dose CT scan of the lungs is recommended for people who have at least a 30-pack-year history of smoking and are a current smoker or have quit within the past 15 years. A pack year of smoking is smoking an average of 1 pack of cigarettes a day for 1 year (for example: 1 pack a day for 30 years or 2 packs a day for 15 years). Yearly screening should continue until the smoker has stopped smoking for at least 15 years. Yearly screening should be stopped for people who develop a health problem that would prevent them from having lung cancer treatment.  If you choose to drink alcohol, do not have more than  2 drinks per day. One drink is considered to be 12 ounces (355 mL) of beer, 5 ounces (148 mL) of wine, or 1.5 ounces (44 mL) of liquor.  Avoid use of street drugs. Do not share needles with anyone. Ask for help if you need support or instructions about stopping the use of drugs.  High blood pressure causes heart disease and increases the risk of stroke. Your blood pressure should be checked at least every 1-2 years. Ongoing high blood pressure should be treated with  medicines, if weight loss and exercise are not effective.  If you are 45-79 years old, ask your health care provider if you should take aspirin to prevent heart disease.  Diabetes screening involves taking a blood sample to check your fasting blood sugar level. This should be done once every 3 years, after age 45, if you are within normal weight and without risk factors for diabetes. Testing should be considered at a younger age or be carried out more frequently if you are overweight and have at least 1 risk factor for diabetes.  Colorectal cancer can be detected and often prevented. Most routine colorectal cancer screening begins at the age of 50 and continues through age 75. However, your health care provider may recommend screening at an earlier age if you have risk factors for colon cancer. On a yearly basis, your health care provider may provide home test kits to check for hidden blood in the stool. Use of a small camera at the end of a tube to directly examine the colon (sigmoidoscopy or colonoscopy) can detect the earliest forms of colorectal cancer. Talk to your health care provider about this at age 50, when routine screening begins. Direct exam of the colon should be repeated every 5-10 years through age 75, unless early forms of precancerous polyps or small growths are found.  People who are at an increased risk for hepatitis B should be screened for this virus. You are considered at high risk for hepatitis B if:  You were born in a country where hepatitis B occurs often. Talk with your health care provider about which countries are considered high risk.  Your parents were born in a high-risk country and you have not received a shot to protect against hepatitis B (hepatitis B vaccine).  You have HIV or AIDS.  You use needles to inject street drugs.  You live with, or have sex with, someone who has hepatitis B.  You are a man who has sex with other men (MSM).  You get hemodialysis  treatment.  You take certain medicines for conditions such as cancer, organ transplantation, and autoimmune conditions.  Hepatitis C blood testing is recommended for all people born from 1945 through 1965 and any individual with known risks for hepatitis C.  Practice safe sex. Use condoms and avoid high-risk sexual practices to reduce the spread of sexually transmitted infections (STIs). STIs include gonorrhea, chlamydia, syphilis, trichomonas, herpes, HPV, and human immunodeficiency virus (HIV). Herpes, HIV, and HPV are viral illnesses that have no cure. They can result in disability, cancer, and death.  If you are at risk of being infected with HIV, it is recommended that you take a prescription medicine daily to prevent HIV infection. This is called preexposure prophylaxis (PrEP). You are considered at risk if:  You are a man who has sex with other men (MSM) and have other risk factors.  You are a heterosexual man, are sexually active, and are at increased risk for HIV infection.    You take drugs by injection.  You are sexually active with a partner who has HIV.  Talk with your health care provider about whether you are at high risk of being infected with HIV. If you choose to begin PrEP, you should first be tested for HIV. You should then be tested every 3 months for as long as you are taking PrEP.  A one-time screening for abdominal aortic aneurysm (AAA) and surgical repair of large AAAs by ultrasound are recommended for men ages 32 to 67 years who are current or former smokers.  Healthy men should no longer receive prostate-specific antigen (PSA) blood tests as part of routine cancer screening. Talk with your health care provider about prostate cancer screening.  Testicular cancer screening is not recommended for adult males who have no symptoms. Screening includes self-exam, a health care provider exam, and other screening tests. Consult with your health care provider about any symptoms  you have or any concerns you have about testicular cancer.  Use sunscreen. Apply sunscreen liberally and repeatedly throughout the day. You should seek shade when your shadow is shorter than you. Protect yourself by wearing long sleeves, pants, a wide-brimmed hat, and sunglasses year round, whenever you are outdoors.  Once a month, do a whole-body skin exam, using a mirror to look at the skin on your back. Tell your health care provider about new moles, moles that have irregular borders, moles that are larger than a pencil eraser, or moles that have changed in shape or color.  Stay current with required vaccines (immunizations).  Influenza vaccine. All adults should be immunized every year.  Tetanus, diphtheria, and acellular pertussis (Td, Tdap) vaccine. An adult who has not previously received Tdap or who does not know his vaccine status should receive 1 dose of Tdap. This initial dose should be followed by tetanus and diphtheria toxoids (Td) booster doses every 10 years. Adults with an unknown or incomplete history of completing a 3-dose immunization series with Td-containing vaccines should begin or complete a primary immunization series including a Tdap dose. Adults should receive a Td booster every 10 years.  Varicella vaccine. An adult without evidence of immunity to varicella should receive 2 doses or a second dose if he has previously received 1 dose.  Human papillomavirus (HPV) vaccine. Males aged 68-21 years who have not received the vaccine previously should receive the 3-dose series. Males aged 22-26 years may be immunized. Immunization is recommended through the age of 6 years for any male who has sex with males and did not get any or all doses earlier. Immunization is recommended for any person with an immunocompromised condition through the age of 49 years if he did not get any or all doses earlier. During the 3-dose series, the second dose should be obtained 4-8 weeks after the first  dose. The third dose should be obtained 24 weeks after the first dose and 16 weeks after the second dose.  Zoster vaccine. One dose is recommended for adults aged 50 years or older unless certain conditions are present.  Measles, mumps, and rubella (MMR) vaccine. Adults born before [birth year] generally are considered immune to measles and mumps. Adults born in [birth year] or later should have 1 or more doses of MMR vaccine unless there is a contraindication to the vaccine or there is laboratory evidence of immunity to each of the three diseases. A routine second dose of MMR vaccine should be obtained at least 28 days after the first dose for students attending postsecondary  schools, health care workers, or international travelers. People who received inactivated measles vaccine or an unknown type of measles vaccine during 1963-1967 should receive 2 doses of MMR vaccine. People who received inactivated mumps vaccine or an unknown type of mumps vaccine before 1979 and are at high risk for mumps infection should consider immunization with 2 doses of MMR vaccine. Unvaccinated health care workers born before 1957 who lack laboratory evidence of measles, mumps, or rubella immunity or laboratory confirmation of disease should consider measles and mumps immunization with 2 doses of MMR vaccine or rubella immunization with 1 dose of MMR vaccine.  Pneumococcal 13-valent conjugate (PCV13) vaccine. When indicated, a person who is uncertain of his immunization history and has no record of immunization should receive the PCV13 vaccine. An adult aged 19 years or older who has certain medical conditions and has not been previously immunized should receive 1 dose of PCV13 vaccine. This PCV13 should be followed with a dose of pneumococcal polysaccharide (PPSV23) vaccine. The PPSV23 vaccine dose should be obtained at least 8 weeks after the dose of PCV13 vaccine. An adult aged 19 years or older who has certain medical conditions and  previously received 1 or more doses of PPSV23 vaccine should receive 1 dose of PCV13. The PCV13 vaccine dose should be obtained 1 or more years after the last PPSV23 vaccine dose.  Pneumococcal polysaccharide (PPSV23) vaccine. When PCV13 is also indicated, PCV13 should be obtained first. All adults aged 65 years and older should be immunized. An adult younger than age 65 years who has certain medical conditions should be immunized. Any person who resides in a nursing home or long-term care facility should be immunized. An adult smoker should be immunized. People with an immunocompromised condition and certain other conditions should receive both PCV13 and PPSV23 vaccines. People with human immunodeficiency virus (HIV) infection should be immunized as soon as possible after diagnosis. Immunization during chemotherapy or radiation therapy should be avoided. Routine use of PPSV23 vaccine is not recommended for American Indians, [place] Natives, or people younger than 65 years unless there are medical conditions that require PPSV23 vaccine. When indicated, people who have unknown immunization and have no record of immunization should receive PPSV23 vaccine. One-time revaccination 5 years after the first dose of PPSV23 is recommended for people aged 19-64 years who have chronic kidney failure, nephrotic syndrome, asplenia, or immunocompromised conditions. People who received 1-2 doses of PPSV23 before age 65 years should receive another dose of PPSV23 vaccine at age 65 years or later if at least 5 years have passed since the previous dose. Doses of PPSV23 are not needed for people immunized with PPSV23 at or after age 65 years.  Meningococcal vaccine. Adults with asplenia or persistent complement component deficiencies should receive 2 doses of quadrivalent meningococcal conjugate (MenACWY-D) vaccine. The doses should be obtained at least 2 months apart. Microbiologists working with certain meningococcal bacteria,  military recruits, people at risk during an outbreak, and people who travel to or live in countries with a high rate of meningitis should be immunized. A first-year college student up through age 21 years who is living in a residence hall should receive a dose if he did not receive a dose on or after his 16th birthday. Adults who have certain high-risk conditions should receive one or more doses of vaccine.  Hepatitis A vaccine. Adults who wish to be protected from this disease, have certain high-risk conditions, work with hepatitis A-infected animals, work in hepatitis A research labs, or   travel to or work in countries with a high rate of hepatitis A should be immunized. Adults who were previously unvaccinated and who anticipate close contact with an international adoptee during the first 60 days after arrival in the [place] from a country with a high rate of hepatitis A should be immunized.  Hepatitis B vaccine. Adults should be immunized if they wish to be protected from this disease, have certain high-risk conditions, may be exposed to blood or other infectious body fluids, are household contacts or sex partners of hepatitis B positive people, are clients or workers in certain care facilities, or travel to or work in countries with a high rate of hepatitis B.  Haemophilus influenzae type b (Hib) vaccine. A previously unvaccinated person with asplenia or sickle cell disease or having a scheduled splenectomy should receive 1 dose of Hib vaccine. Regardless of previous immunization, a recipient of a hematopoietic stem cell transplant should receive a 3-dose series 6-12 months after his successful transplant. Hib vaccine is not recommended for adults with HIV infection. Preventive Service / Frequency Ages 52 to 17  Blood pressure check.** / Every 1 to 2 years.  Lipid and cholesterol check.** / Every 5 years beginning at age 69.  Hepatitis C blood test.** / For any individual with known risks for  hepatitis C.  Skin self-exam. / Monthly.  Influenza vaccine. / Every year.  Tetanus, diphtheria, and acellular pertussis (Tdap, Td) vaccine.** / Consult your health care provider. 1 dose of Td every 10 years.  Varicella vaccine.** / Consult your health care provider.  HPV vaccine. / 3 doses over 6 months, if 72 or younger.  Measles, mumps, rubella (MMR) vaccine.** / You need at least 1 dose of MMR if you were born in 1957 or later. You may also need a second dose.  Pneumococcal 13-valent conjugate (PCV13) vaccine.** / Consult your health care provider.  Pneumococcal polysaccharide (PPSV23) vaccine.** / 1 to 2 doses if you smoke cigarettes or if you have certain conditions.  Meningococcal vaccine.** / 1 dose if you are age 35 to 60 years and a Market researcher living in a residence hall, or have one of several medical conditions. You may also need additional booster doses.  Hepatitis A vaccine.** / Consult your health care provider.  Hepatitis B vaccine.** / Consult your health care provider.  Haemophilus influenzae type b (Hib) vaccine.** / Consult your health care provider. Ages 35 to 8  Blood pressure check.** / Every 1 to 2 years.  Lipid and cholesterol check.** / Every 5 years beginning at age 57.  Lung cancer screening. / Every year if you are aged 44-80 years and have a 30-pack-year history of smoking and currently smoke or have quit within the past 15 years. Yearly screening is stopped once you have quit smoking for at least 15 years or develop a health problem that would prevent you from having lung cancer treatment.  Fecal occult blood test (FOBT) of stool. / Every year beginning at age 55 and continuing until age 73. You may not have to do this test if you get a colonoscopy every 10 years.  Flexible sigmoidoscopy** or colonoscopy.** / Every 5 years for a flexible sigmoidoscopy or every 10 years for a colonoscopy beginning at age 28 and continuing until age  1.  Hepatitis C blood test.** / For all people born from [birth year] through 1965 and any individual with known risks for hepatitis C.  Skin self-exam. / Monthly.  Influenza vaccine. / Every  year.  Tetanus, diphtheria, and acellular pertussis (Tdap/Td) vaccine.** / Consult your health care provider. 1 dose of Td every 10 years.  Varicella vaccine.** / Consult your health care provider.  Zoster vaccine.** / 1 dose for adults aged 53 years or older.  Measles, mumps, rubella (MMR) vaccine.** / You need at least 1 dose of MMR if you were born in 1957 or later. You may also need a second dose.  Pneumococcal 13-valent conjugate (PCV13) vaccine.** / Consult your health care provider.  Pneumococcal polysaccharide (PPSV23) vaccine.** / 1 to 2 doses if you smoke cigarettes or if you have certain conditions.  Meningococcal vaccine.** / Consult your health care provider.  Hepatitis A vaccine.** / Consult your health care provider.  Hepatitis B vaccine.** / Consult your health care provider.  Haemophilus influenzae type b (Hib) vaccine.** / Consult your health care provider. Ages 77 and over  Blood pressure check.** / Every 1 to 2 years.  Lipid and cholesterol check.**/ Every 5 years beginning at age 85.  Lung cancer screening. / Every year if you are aged 55-80 years and have a 30-pack-year history of smoking and currently smoke or have quit within the past 15 years. Yearly screening is stopped once you have quit smoking for at least 15 years or develop a health problem that would prevent you from having lung cancer treatment.  Fecal occult blood test (FOBT) of stool. / Every year beginning at age 33 and continuing until age 11. You may not have to do this test if you get a colonoscopy every 10 years.  Flexible sigmoidoscopy** or colonoscopy.** / Every 5 years for a flexible sigmoidoscopy or every 10 years for a colonoscopy beginning at age 28 and continuing until age 73.  Hepatitis C blood  test.** / For all people born from [birth year] through 1965 and any individual with known risks for hepatitis C.  Abdominal aortic aneurysm (AAA) screening.** / A one-time screening for ages 50 to 27 years who are current or former smokers.  Skin self-exam. / Monthly.  Influenza vaccine. / Every year.  Tetanus, diphtheria, and acellular pertussis (Tdap/Td) vaccine.** / 1 dose of Td every 10 years.  Varicella vaccine.** / Consult your health care provider.  Zoster vaccine.** / 1 dose for adults aged 34 years or older.  Pneumococcal 13-valent conjugate (PCV13) vaccine.** / Consult your health care provider.  Pneumococcal polysaccharide (PPSV23) vaccine.** / 1 dose for all adults aged 63 years and older.  Meningococcal vaccine.** / Consult your health care provider.  Hepatitis A vaccine.** / Consult your health care provider.  Hepatitis B vaccine.** / Consult your health care provider.  Haemophilus influenzae type b (Hib) vaccine.** / Consult your health care provider. **Family history and personal history of risk and conditions may change your health care provider's recommendations. Document Released: 04/24/2001 Document Revised: 03/03/2013 Document Reviewed: 07/24/2010 [facility] Patient Information 2015 [place], [place]. This information is not intended to replace advice given to you by your health care provider. Make sure you discuss any questions you have with your health care provider.

## 2014-10-08 NOTE — Assessment & Plan Note (Addendum)
Abd [place] Finish Cipro

## 2014-10-08 NOTE — Progress Notes (Signed)
Pre visit review using our clinic review tool, if applicable. No additional management support is needed unless otherwise documented below in the visit note. 

## 2014-10-08 NOTE — Progress Notes (Signed)
Subjective:  Patient ID: Gordon Lucas, male    DOB: 1950-06-27  Age: 64 y.o. MRN: [id number]  CC: No chief complaint on file.   HPI  The patient is here for a wellness exam.  Gordon Lucas presents for LBP, BPH, ED f/u  Outpatient Prescriptions Prior to Visit  Medication Sig Dispense Refill  . amLODipine (NORVASC) 5 MG tablet Take 1 tablet (5 mg total) by mouth daily. 90 tablet 3  . Cholecalciferol (EQL VITAMIN D3) 1000 UNITS tablet Take 1,000 Units by mouth daily.      . ciprofloxacin (CIPRO) 500 MG tablet Take 1 tablet (500 mg total) by mouth 2 (two) times daily. 20 tablet 0  . cyclobenzaprine (FLEXERIL) 10 MG tablet Take 1 tablet (10 mg total) by mouth 3 (three) times daily as needed. 90 tablet 1  . HYDROcodone-acetaminophen (NORCO) 10-325 MG per tablet Take 1 tablet by mouth every 6 (six) hours as needed for severe pain. Please fill on or after 09/14/14 120 tablet 0  . Polyethylene Glycol 1450 (BASE A POLYETHYLENE GLYCOL) POWD 17 g by Does not apply route daily as needed. 2000 g 11  . polyethylene glycol powder (GLYCOLAX/MIRALAX) powder MIX 17 GRAMS IN LIQUID AND DRINK DAILY AS NEEDED 527 g 2  . sildenafil (VIAGRA) 100 MG tablet Take 0.5 tablets (50 mg total) by mouth as needed for erectile dysfunction. 10 tablet 4  . tamsulosin (FLOMAX) 0.4 MG CAPS capsule Take 1 capsule (0.4 mg total) by mouth daily. 90 capsule 3  . tadalafil (CIALIS) 20 MG tablet Take 20 mg by mouth daily as needed.       No facility-administered medications prior to visit.    ROS Review of Systems  Constitutional: Negative for appetite change, fatigue and unexpected weight change.  HENT: Negative for congestion, nosebleeds, sneezing, sore throat and trouble swallowing.   Eyes: Negative for itching and visual disturbance.  Respiratory: Negative for cough.   Cardiovascular: Negative for chest pain, palpitations and leg swelling.  Gastrointestinal: Negative for nausea, diarrhea, blood in stool and  abdominal distention.  Genitourinary: Negative for frequency and hematuria.  Musculoskeletal: Positive for back pain. Negative for joint swelling, gait problem and neck pain.  Skin: Negative for rash.  Neurological: Negative for dizziness, tremors, speech difficulty and weakness.  Psychiatric/Behavioral: Negative for suicidal ideas, sleep disturbance, dysphoric mood and agitation. The patient is not nervous/anxious.     Objective:  BP 120/80 mmHg  Pulse 98  Wt 195 lb (88.451 kg)  SpO2 98%  BP Readings from Last 3 Encounters:  10/08/14 120/80  07/15/14 130/78  04/16/14 160/84    Wt Readings from Last 3 Encounters:  10/08/14 195 lb (88.451 kg)  07/15/14 199 lb (90.266 kg)  04/16/14 196 lb (88.905 kg)    Physical Exam  Constitutional: He is oriented to person, place, and time. He appears well-developed. No distress.  NAD Obese  HENT:  Mouth/Throat: Oropharynx is clear and moist.  Eyes: Conjunctivae are normal. Pupils are equal, round, and reactive to light.  Neck: Normal range of motion. No JVD present. No thyromegaly present.  Cardiovascular: Normal rate, regular rhythm, normal heart sounds and intact distal pulses.  Exam reveals no gallop and no friction rub.   No murmur heard. Pulmonary/Chest: Effort normal and breath sounds normal. No respiratory distress. He has no wheezes. He has no rales. He exhibits no tenderness.  Abdominal: Soft. Bowel sounds are normal. He exhibits no distension and no mass. There is no tenderness. There is  no rebound and no guarding.  Musculoskeletal: Normal range of motion. He exhibits tenderness. He exhibits no edema.  Lymphadenopathy:    He has no cervical adenopathy.  Neurological: He is alert and oriented to person, place, and time. He has normal reflexes. No cranial nerve deficit. He exhibits normal muscle tone. He displays a negative Romberg sign. Coordination and gait normal.  Skin: Skin is warm and dry. No rash noted.  Psychiatric: He has  a normal mood and affect. His behavior is normal. Judgment and thought content normal.  rect declined LS tender R>L  Lab Results  Component Value Date   WBC 7.1 10/04/2014   HGB 14.2 10/04/2014   HCT 43.6 10/04/2014   PLT 242.0 10/04/2014   GLUCOSE 92 10/04/2014   CHOL 182 10/04/2014   TRIG 116.0 10/04/2014   HDL 28.00* 10/04/2014   LDLDIRECT 152.8 09/15/2012   LDLCALC 131* 10/04/2014   ALT 18 10/04/2014   AST 17 10/04/2014   NA 139 10/04/2014   K 3.7 10/04/2014   CL 104 10/04/2014   CREATININE 1.02 10/04/2014   BUN 9 10/04/2014   CO2 27 10/04/2014   TSH 1.26 10/04/2014   PSA 1.19 10/04/2014   HGBA1C 4.9 10/04/2014    No results found.  Assessment & Plan:   Diagnoses and all orders for this visit:  Low back pain without sciatica, unspecified back pain laterality Orders: -     cyclobenzaprine (FLEXERIL) 10 MG tablet; Take 1 tablet (10 mg total) by mouth 3 (three) times daily as needed. -     sildenafil (VIAGRA) 100 MG tablet; Take 0.5 tablets (50 mg total) by mouth as needed for erectile dysfunction.  WEIGHT GAIN Orders: -     cyclobenzaprine (FLEXERIL) 10 MG tablet; Take 1 tablet (10 mg total) by mouth 3 (three) times daily as needed. -     HYDROcodone-acetaminophen (NORCO) 10-325 MG per tablet; Take 1 tablet by mouth every 6 (six) hours as needed for severe pain. Please fill on or after 09/14/14 -     sildenafil (VIAGRA) 100 MG tablet; Take 0.5 tablets (50 mg total) by mouth as needed for erectile dysfunction. -     tamsulosin (FLOMAX) 0.4 MG CAPS capsule; Take 1 capsule (0.4 mg total) by mouth daily.  ELEVATED BP Orders: -     cyclobenzaprine (FLEXERIL) 10 MG tablet; Take 1 tablet (10 mg total) by mouth 3 (three) times daily as needed. -     HYDROcodone-acetaminophen (NORCO) 10-325 MG per tablet; Take 1 tablet by mouth every 6 (six) hours as needed for severe pain. Please fill on or after 09/14/14 -     sildenafil (VIAGRA) 100 MG tablet; Take 0.5 tablets (50 mg total)  by mouth as needed for erectile dysfunction. -     tamsulosin (FLOMAX) 0.4 MG CAPS capsule; Take 1 capsule (0.4 mg total) by mouth daily.  Elevated PSA Orders: -     cyclobenzaprine (FLEXERIL) 10 MG tablet; Take 1 tablet (10 mg total) by mouth 3 (three) times daily as needed. -     HYDROcodone-acetaminophen (NORCO) 10-325 MG per tablet; Take 1 tablet by mouth every 6 (six) hours as needed for severe pain. Please fill on or after 09/14/14 -     sildenafil (VIAGRA) 100 MG tablet; Take 0.5 tablets (50 mg total) by mouth as needed for erectile dysfunction. -     tamsulosin (FLOMAX) 0.4 MG CAPS capsule; Take 1 capsule (0.4 mg total) by mouth daily.  Bilateral low back pain without  sciatica Orders: -     HYDROcodone-acetaminophen (NORCO) 10-325 MG per tablet; Take 1 tablet by mouth every 6 (six) hours as needed for severe pain. Please fill on or after 09/14/14   I am having Gordon Lucas maintain his Cholecalciferol, tadalafil, Base A Polyethylene Glycol, amLODipine, tamsulosin, cyclobenzaprine, sildenafil, polyethylene glycol powder, HYDROcodone-acetaminophen, and ciprofloxacin.  No orders of the defined types were placed in this encounter.     Follow-up: No Follow-up on file.  Sonda Primes, MD

## 2014-10-13 ENCOUNTER — Telehealth: Payer: Self-pay | Admitting: *Deleted

## 2014-10-13 NOTE — Telephone Encounter (Signed)
Viagra 100 mg PA is denied. Pt's pharmacy informed.

## 2014-10-14 ENCOUNTER — Ambulatory Visit: Payer: Medicare PPO | Admitting: Internal Medicine

## 2014-10-18 ENCOUNTER — Ambulatory Visit: Admission: RE | Admit: 2014-10-18 | Payer: Medicare PPO | Source: Ambulatory Visit

## 2014-10-18 ENCOUNTER — Ambulatory Visit
Admission: RE | Admit: 2014-10-18 | Discharge: 2014-10-18 | Disposition: A | Discharge status: Home / Self Care | Payer: Medicare PPO | Source: Ambulatory Visit | Attending: Internal Medicine | Admitting: Internal Medicine

## 2014-10-18 DIAGNOSIS — N390 Urinary tract infection, site not specified: Secondary | ICD-10-CM

## 2014-10-18 DIAGNOSIS — N1330 Unspecified hydronephrosis: Secondary | ICD-10-CM | POA: Diagnosis not present

## 2014-10-18 DIAGNOSIS — N3289 Other specified disorders of bladder: Secondary | ICD-10-CM | POA: Diagnosis not present

## 2014-10-18 DIAGNOSIS — N400 Enlarged prostate without lower urinary tract symptoms: Secondary | ICD-10-CM | POA: Diagnosis not present

## 2014-10-18 NOTE — Addendum Note (Signed)
Addended by: Merrilyn Puma on: 10/18/2014 08:48 AM   Modules accepted: Orders

## 2014-10-21 ENCOUNTER — Telehealth: Payer: Self-pay | Admitting: Internal Medicine

## 2014-10-21 DIAGNOSIS — N134 Hydroureter: Secondary | ICD-10-CM

## 2014-10-21 NOTE — Telephone Encounter (Signed)
Patient is calling you back from yesterday.

## 2014-10-21 NOTE — Telephone Encounter (Signed)
Pt informed of below. He is ok with CT. Please place order.    Notes Recorded by Merrilyn Puma, CMA on 10/20/2014 at 4:59 PM Left mess for patient to call back. Notes Recorded by Tresa Garter, MD on 10/18/2014 at 10:51 PM Misty Stanley, please, inform patient that he has a L ureter blockage: we need to get a CT: OK to schedule? Thx

## 2014-10-26 NOTE — Telephone Encounter (Signed)
Ok Thx 

## 2014-11-04 ENCOUNTER — Other Ambulatory Visit: Payer: Self-pay | Admitting: Internal Medicine

## 2014-11-04 ENCOUNTER — Ambulatory Visit (INDEPENDENT_AMBULATORY_CARE_PROVIDER_SITE_OTHER)
Admission: RE | Admit: 2014-11-04 | Discharge: 2014-11-04 | Disposition: A | Discharge status: Home / Self Care | Payer: Medicare PPO | Source: Ambulatory Visit | Attending: Internal Medicine | Admitting: Internal Medicine

## 2014-11-04 DIAGNOSIS — N132 Hydronephrosis with renal and ureteral calculous obstruction: Secondary | ICD-10-CM | POA: Diagnosis not present

## 2014-11-04 DIAGNOSIS — N134 Hydroureter: Secondary | ICD-10-CM

## 2014-11-04 DIAGNOSIS — N210 Calculus in bladder: Secondary | ICD-10-CM | POA: Diagnosis not present

## 2014-11-04 DIAGNOSIS — N135 Crossing vessel and stricture of ureter without hydronephrosis: Secondary | ICD-10-CM

## 2014-11-04 MED ORDER — IOHEXOL 300 MG/ML  SOLN
100.0000 mL | Freq: Once | INTRAMUSCULAR | Status: AC | PRN
Start: 2014-11-04 — End: 2014-11-04
  Administered 2014-11-04: 100 mL via INTRAVENOUS

## 2014-11-11 ENCOUNTER — Telehealth: Payer: Self-pay | Admitting: Internal Medicine

## 2014-11-11 NOTE — Telephone Encounter (Signed)
Patient is returning your phone call. 

## 2014-11-12 ENCOUNTER — Encounter: Payer: Self-pay | Admitting: Internal Medicine

## 2014-11-16 NOTE — Telephone Encounter (Signed)
Pt informed of below.     Notes Recorded by Merrilyn Puma, CMA on 11/05/2014 at 5:01 PM Left mess for patient to call back. Notes Recorded by Tresa Garter, MD on 11/04/2014 at 8:47 PM Misty Stanley, please, inform patient that CT shows a large stone in the left kidney obstructing urine passage. I'll ref to Urology. Thx    Details

## 2015-01-04 ENCOUNTER — Encounter: Payer: Self-pay | Admitting: Internal Medicine

## 2015-01-04 ENCOUNTER — Telehealth: Payer: Self-pay | Admitting: *Deleted

## 2015-01-04 ENCOUNTER — Ambulatory Visit (INDEPENDENT_AMBULATORY_CARE_PROVIDER_SITE_OTHER): Payer: Medicare PPO | Admitting: Internal Medicine

## 2015-01-04 VITALS — BP 139/80 | HR 94 | Wt 184.0 lb

## 2015-01-04 DIAGNOSIS — N200 Calculus of kidney: Secondary | ICD-10-CM | POA: Diagnosis not present

## 2015-01-04 DIAGNOSIS — N201 Calculus of ureter: Secondary | ICD-10-CM | POA: Diagnosis not present

## 2015-01-04 DIAGNOSIS — N401 Enlarged prostate with lower urinary tract symptoms: Secondary | ICD-10-CM | POA: Diagnosis not present

## 2015-01-04 DIAGNOSIS — R3913 Splitting of urinary stream: Secondary | ICD-10-CM | POA: Diagnosis not present

## 2015-01-04 DIAGNOSIS — R030 Elevated blood-pressure reading, without diagnosis of hypertension: Secondary | ICD-10-CM

## 2015-01-04 DIAGNOSIS — M545 Low back pain, unspecified: Secondary | ICD-10-CM

## 2015-01-04 DIAGNOSIS — Z23 Encounter for immunization: Secondary | ICD-10-CM | POA: Diagnosis not present

## 2015-01-04 DIAGNOSIS — R635 Abnormal weight gain: Secondary | ICD-10-CM | POA: Diagnosis not present

## 2015-01-04 DIAGNOSIS — N210 Calculus in bladder: Secondary | ICD-10-CM | POA: Diagnosis not present

## 2015-01-04 DIAGNOSIS — R311 Benign essential microscopic hematuria: Secondary | ICD-10-CM | POA: Diagnosis not present

## 2015-01-04 DIAGNOSIS — R972 Elevated prostate specific antigen [PSA]: Secondary | ICD-10-CM

## 2015-01-04 DIAGNOSIS — Z720 Tobacco use: Secondary | ICD-10-CM | POA: Diagnosis not present

## 2015-01-04 MED ORDER — AMLODIPINE BESYLATE 5 MG PO TABS: 5.0000 mg | ORAL_TABLET | Freq: Every day | ORAL | Status: DC

## 2015-01-04 MED ORDER — POLYETHYLENE GLYCOL 3350 17 GM/SCOOP PO POWD: ORAL | Status: DC

## 2015-01-04 MED ORDER — HYDROCODONE-ACETAMINOPHEN 10-325 MG PO TABS: 1.0000 | ORAL_TABLET | Freq: Four times a day (QID) | ORAL | Status: DC | PRN

## 2015-01-04 MED ORDER — CYCLOBENZAPRINE HCL 10 MG PO TABS: 10.0000 mg | ORAL_TABLET | Freq: Three times a day (TID) | ORAL | Status: DC | PRN

## 2015-01-04 NOTE — Assessment & Plan Note (Signed)
Wt Readings from Last 3 Encounters:  01/04/15 184 lb (83.462 kg)  10/08/14 195 lb (88.451 kg)  07/15/14 199 lb (90.266 kg)  better

## 2015-01-04 NOTE — Telephone Encounter (Signed)
[name] is calling requesting surgical clearance for pt's upcoming surgery. Surgery is not scheduled yet pending your surgical clearance. Can you add something about this in todays OV note and I can fax it to her at [phone].  [name]'s phone number is [phone].

## 2015-01-04 NOTE — Assessment & Plan Note (Signed)
2016 Dr Patsi Searsannenbaum - procedures are planned Abd CT: IMPRESSION: 1. 16 x 11 mm left UPJ calculus causing partial obstruction. Moderate to marked left-sided hydronephrosis but contrast gets past the calculus. 2. Two small lower pole left renal calculi. 3. Two left-sided bladder calculi and mild diffuse bladder wall thickening likely due to an enlarged prostate gland. 4. No acute abdominal/pelvic findings, mass lesions or adenopathy. 5. 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   Electronically Signed  By: Rudie MeyerP. Gallerani M.D.  On: 11/04/2014 12:06

## 2015-01-04 NOTE — Assessment & Plan Note (Signed)
Chronic OA Norco prn Potential benefits of a long term opioids use as well as potential risks (i.e. addiction risk, apnea etc) and complications (i.e. Somnolence, constipation and others) were explained to the patient and were aknowledged. 

## 2015-01-04 NOTE — Progress Notes (Signed)
Pre visit review using our clinic review tool, if applicable. No additional management support is needed unless otherwise documented below in the visit note. 

## 2015-01-04 NOTE — Progress Notes (Signed)
Subjective:  Patient ID: Gordon Lucas, male    DOB: 1951-01-07  Age: 64 y.o. MRN: [id number]  CC: No chief complaint on file.   HPI Friend Gordon Lucas presents for LBP, kidney stones, HTN f/u  Outpatient Prescriptions Prior to Visit  Medication Sig Dispense Refill  . Cholecalciferol (EQL VITAMIN D3) 1000 UNITS tablet Take 1,000 Units by mouth daily.      . Polyethylene Glycol 1450 (BASE A POLYETHYLENE GLYCOL) POWD 17 g by Does not apply route daily as needed. 2000 g 11  . sildenafil (VIAGRA) 100 MG tablet Take 0.5 tablets (50 mg total) by mouth as needed for erectile dysfunction. 10 tablet 4  . tamsulosin (FLOMAX) 0.4 MG CAPS capsule Take 1 capsule (0.4 mg total) by mouth daily. 90 capsule 3  . amLODipine (NORVASC) 5 MG tablet Take 1 tablet (5 mg total) by mouth daily. 90 tablet 3  . ciprofloxacin (CIPRO) 500 MG tablet Take 1 tablet (500 mg total) by mouth 2 (two) times daily. 20 tablet 0  . cyclobenzaprine (FLEXERIL) 10 MG tablet Take 1 tablet (10 mg total) by mouth 3 (three) times daily as needed. 90 tablet 1  . HYDROcodone-acetaminophen (NORCO) 10-325 MG per tablet Take 1 tablet by mouth every 6 (six) hours as needed for severe pain. Please fill on or after 12/15/14 120 tablet 0  . polyethylene glycol powder (GLYCOLAX/MIRALAX) powder MIX 17 GRAMS IN LIQUID AND DRINK DAILY AS NEEDED 527 g 2  . tadalafil (CIALIS) 20 MG tablet Take 20 mg by mouth daily as needed.       No facility-administered medications prior to visit.    ROS Review of Systems  Constitutional: Negative for appetite change, fatigue and unexpected weight change.  HENT: Negative for congestion, nosebleeds, sneezing, sore throat and trouble swallowing.   Eyes: Negative for itching and visual disturbance.  Respiratory: Negative for cough.   Cardiovascular: Negative for chest pain, palpitations and leg swelling.  Gastrointestinal: Negative for nausea, diarrhea, blood in stool and abdominal distention.    Genitourinary: Negative for frequency and hematuria.  Musculoskeletal: Positive for back pain. Negative for joint swelling, gait problem and neck pain.  Skin: Negative for rash.  Neurological: Negative for dizziness, tremors, speech difficulty and weakness.  Psychiatric/Behavioral: Negative for sleep disturbance, dysphoric mood and agitation. The patient is not nervous/anxious.     Objective:  BP 139/80 mmHg  Pulse 94  Wt 184 lb (83.462 kg)  SpO2 97%  BP Readings from Last 3 Encounters:  01/04/15 139/80  10/08/14 120/80  07/15/14 130/78    Wt Readings from Last 3 Encounters:  01/04/15 184 lb (83.462 kg)  10/08/14 195 lb (88.451 kg)  07/15/14 199 lb (90.266 kg)    Physical Exam  Constitutional: He is oriented to person, place, and time. He appears well-developed. No distress.  NAD  HENT:  Mouth/Throat: Oropharynx is clear and moist.  Eyes: Conjunctivae are normal. Pupils are equal, round, and reactive to light.  Neck: Normal range of motion. No JVD present. No thyromegaly present.  Cardiovascular: Normal rate, regular rhythm, normal heart sounds and intact distal pulses.  Exam reveals no gallop and no friction rub.   No murmur heard. Pulmonary/Chest: Effort normal and breath sounds normal. No respiratory distress. He has no wheezes. He has no rales. He exhibits no tenderness.  Abdominal: Soft. Bowel sounds are normal. He exhibits no distension and no mass. There is no tenderness. There is no rebound and no guarding.  Musculoskeletal: Normal range of motion.  He exhibits tenderness. He exhibits no edema.  Lymphadenopathy:    He has no cervical adenopathy.  Neurological: He is alert and oriented to person, place, and time. He has normal reflexes. No cranial nerve deficit. He exhibits normal muscle tone. He displays a negative Romberg sign. Coordination and gait normal.  Skin: Skin is warm and dry. No rash noted.  Psychiatric: He has a normal mood and affect. His behavior is  normal. Judgment and thought content normal.  LS is tender  Lab Results  Component Value Date   WBC 7.1 10/04/2014   HGB 14.2 10/04/2014   HCT 43.6 10/04/2014   PLT 242.0 10/04/2014   GLUCOSE 92 10/04/2014   CHOL 182 10/04/2014   TRIG 116.0 10/04/2014   HDL 28.00* 10/04/2014   LDLDIRECT 152.8 09/15/2012   LDLCALC 131* 10/04/2014   ALT 18 10/04/2014   AST 17 10/04/2014   NA 139 10/04/2014   K 3.7 10/04/2014   CL 104 10/04/2014   CREATININE 1.02 10/04/2014   BUN 9 10/04/2014   CO2 27 10/04/2014   TSH 1.26 10/04/2014   PSA 1.19 10/04/2014   HGBA1C 4.9 10/04/2014    Ct Abdomen Pelvis Gordon Wo Contrast  11/04/2014  CLINICAL DATA:  Frequent urinary tract infections. Left-sided hydronephrosis seen on recent ultrasound examination. EXAM: CT ABDOMEN AND PELVIS WITHOUT AND WITH CONTRAST TECHNIQUE: Multidetector CT imaging of the abdomen and pelvis was performed following the standard protocol before and following the bolus administration of intravenous contrast. CONTRAST:  OMNIPAQUE IOHEXOL 300 MG/ML  SOLN COMPARISON:  Ultrasound 10/18/2014 FINDINGS: Lower chest: The lung bases are clear. Minimal dependent subpleural atelectasis. The heart is within normal limits in size. No pericardial effusion. The distal esophagus is grossly normal. Hepatobiliary: No focal hepatic lesions or intrahepatic biliary dilatation. The gallbladder is mildly contracted. The wall is somewhat irregular in demonstrates irregular enhancement. Findings can be seen with adenomyomatosis. No common bile duct dilatation. Pancreas: No mass, inflammation or ductal dilatation. Spleen: Normal size.  No focal lesions. Adrenals/Urinary Tract: The adrenal glands are normal. The right kidney is normal except for mild scarring changes. No renal calculi or mass. No hydronephrosis and no ureteral calculi. The left kidney demonstrates moderate to marked hydronephrosis due to a 16 x 11 mm UPJ calculus. There are also smaller lower pole  renal calculi. The remainder of the left ureter is normal. Contrast does get the on the calculus. The bladder demonstrates mild uniform wall thickening. There are 2 left-sided bladder calculi. The more superior lesion measures 22 mm and the more inferior and posterior lesion measures 21.5 mm. No obvious bladder mass. The prostate gland is mildly enlarged. The seminal vesicles are normal. Stomach/Bowel: The stomach, duodenum, small bowel and colon are unremarkable. No inflammatory changes, mass lesions or obstructive findings. The terminal ileum is normal. The appendix is normal. Sigmoid diverticulosis without findings for acute diverticulitis. Vascular/Lymphatic: No mesenteric or retroperitoneal mass or adenopathy. The aorta and branch vessels are normal. The major venous structures are patent. Other: No pelvic mass or adenopathy. No free pelvic fluid collections. No inguinal mass or adenopathy. Musculoskeletal: Degenerative changes involving the spine and hips but no worrisome bone lesions. IMPRESSION: 1. 16 x 11 mm left UPJ calculus causing partial obstruction. Moderate to marked left-sided hydronephrosis but contrast gets past the calculus. 2. Two small lower pole left renal calculi. 3. Two left-sided bladder calculi and mild diffuse bladder wall thickening likely due to an enlarged prostate gland. 4. No acute abdominal/pelvic findings, mass lesions  or adenopathy. 5. 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 Electronically Signed   By: Rudie Meyer M.D.   On: 11/04/2014 12:06    Assessment & Plan:   Diagnoses and all orders for this visit:  Low back pain without sciatica, unspecified back pain laterality -     cyclobenzaprine (FLEXERIL) 10 MG tablet; Take 1 tablet (10 mg total) by mouth 3 (three) times daily as needed.  WEIGHT GAIN -     cyclobenzaprine (FLEXERIL) 10 MG tablet; Take 1 tablet (10 mg total) by mouth 3 (three) times daily as needed. -     Discontinue: HYDROcodone-acetaminophen  (NORCO) 10-325 MG tablet; Take 1 tablet by mouth every 6 (six) hours as needed for severe pain. Please fill on or after 01/15/15 -     Discontinue: HYDROcodone-acetaminophen (NORCO) 10-325 MG tablet; Take 1 tablet by mouth every 6 (six) hours as needed for severe pain. Please fill on or after 02/14/15 -     HYDROcodone-acetaminophen (NORCO) 10-325 MG tablet; Take 1 tablet by mouth every 6 (six) hours as needed for severe pain. Please fill on or after 03/17/15  ELEVATED BP -     cyclobenzaprine (FLEXERIL) 10 MG tablet; Take 1 tablet (10 mg total) by mouth 3 (three) times daily as needed. -     Discontinue: HYDROcodone-acetaminophen (NORCO) 10-325 MG tablet; Take 1 tablet by mouth every 6 (six) hours as needed for severe pain. Please fill on or after 01/15/15 -     Discontinue: HYDROcodone-acetaminophen (NORCO) 10-325 MG tablet; Take 1 tablet by mouth every 6 (six) hours as needed for severe pain. Please fill on or after 02/14/15 -     HYDROcodone-acetaminophen (NORCO) 10-325 MG tablet; Take 1 tablet by mouth every 6 (six) hours as needed for severe pain. Please fill on or after 03/17/15  Elevated PSA -     cyclobenzaprine (FLEXERIL) 10 MG tablet; Take 1 tablet (10 mg total) by mouth 3 (three) times daily as needed. -     Discontinue: HYDROcodone-acetaminophen (NORCO) 10-325 MG tablet; Take 1 tablet by mouth every 6 (six) hours as needed for severe pain. Please fill on or after 01/15/15 -     Discontinue: HYDROcodone-acetaminophen (NORCO) 10-325 MG tablet; Take 1 tablet by mouth every 6 (six) hours as needed for severe pain. Please fill on or after 02/14/15 -     HYDROcodone-acetaminophen (NORCO) 10-325 MG tablet; Take 1 tablet by mouth every 6 (six) hours as needed for severe pain. Please fill on or after 03/17/15  Bilateral low back pain without sciatica -     Discontinue: HYDROcodone-acetaminophen (NORCO) 10-325 MG tablet; Take 1 tablet by mouth every 6 (six) hours as needed for severe pain. Please fill on or  after 01/15/15 -     Discontinue: HYDROcodone-acetaminophen (NORCO) 10-325 MG tablet; Take 1 tablet by mouth every 6 (six) hours as needed for severe pain. Please fill on or after 02/14/15 -     HYDROcodone-acetaminophen (NORCO) 10-325 MG tablet; Take 1 tablet by mouth every 6 (six) hours as needed for severe pain. Please fill on or after 03/17/15  Nephrolithiasis  Acute bilateral low back pain without sciatica  Need for influenza vaccination -     Flu Vaccine QUAD 36+ mos IM  Other orders -     amLODipine (NORVASC) 5 MG tablet; Take 1 tablet (5 mg total) by mouth daily. -     polyethylene glycol powder (GLYCOLAX/MIRALAX) powder; MIX 17 GRAMS IN LIQUID AND DRINK DAILY AS NEEDED   I  have discontinued Mr. Lovey NewcomerStephens's ciprofloxacin, HYDROcodone-acetaminophen, and HYDROcodone-acetaminophen. I have also changed his HYDROcodone-acetaminophen. Additionally, I am having him maintain his Cholecalciferol, tadalafil, Base A Polyethylene Glycol, sildenafil, tamsulosin, amLODipine, cyclobenzaprine, and polyethylene glycol powder.  Meds ordered this encounter  Medications  . amLODipine (NORVASC) 5 MG tablet    Sig: Take 1 tablet (5 mg total) by mouth daily.    Dispense:  90 tablet    Refill:  3  . cyclobenzaprine (FLEXERIL) 10 MG tablet    Sig: Take 1 tablet (10 mg total) by mouth 3 (three) times daily as needed.    Dispense:  90 tablet    Refill:  1  . DISCONTD: HYDROcodone-acetaminophen (NORCO) 10-325 MG tablet    Sig: Take 1 tablet by mouth every 6 (six) hours as needed for severe pain. Please fill on or after 01/15/15    Dispense:  120 tablet    Refill:  0  . polyethylene glycol powder (GLYCOLAX/MIRALAX) powder    Sig: MIX 17 GRAMS IN LIQUID AND DRINK DAILY AS NEEDED    Dispense:  527 g    Refill:  3  . DISCONTD: HYDROcodone-acetaminophen (NORCO) 10-325 MG tablet    Sig: Take 1 tablet by mouth every 6 (six) hours as needed for severe pain. Please fill on or after 02/14/15    Dispense:  120  tablet    Refill:  0  . HYDROcodone-acetaminophen (NORCO) 10-325 MG tablet    Sig: Take 1 tablet by mouth every 6 (six) hours as needed for severe pain. Please fill on or after 03/17/15    Dispense:  120 tablet    Refill:  0     Follow-up: Return in about 3 months (around 04/06/2015) for a follow-up visit.  Sonda PrimesAlex Plotnikov, MD

## 2015-01-05 NOTE — Telephone Encounter (Signed)
01/04/15 OV note and clearance letter faxed to [name] at number below.

## 2015-01-05 NOTE — Telephone Encounter (Signed)
He is clear Thx

## 2015-01-06 ENCOUNTER — Other Ambulatory Visit: Payer: Self-pay | Admitting: Urology

## 2015-01-13 NOTE — Patient Instructions (Signed)
Gordon Lucas  01/13/2015   Your procedure is scheduled on: 01/21/2015    [facility]  [facility] to 3rd floor to  Short Stay Center at    0900 AM.  Call this number if you have problems the morning of surgery [phone]   Remember: ONLY 1 PERSON MAY GO WITH YOU TO SHORT STAY TO GET  READY MORNING OF YOUR SURGERY.  Do not eat food or drink liquids :After Midnight.     Take these medicines the morning of surgery with A SIP OF WATER: amlodipine ( Norvasc), Hydrocodone if needed, Flomax                                 You may not have any metal on your body including hair pins and              piercings  Do not wear jewelry,  lotions, powders or perfumes, deodorant                         Men may shave face and neck.   Do not bring valuables to the hospital. [place] IS NOT             RESPONSIBLE   FOR VALUABLES.  Contacts, dentures or bridgework may not be worn into surgery.       Patients discharged the day of surgery will not be allowed to drive home.  Name and phone number of your driver:  Special Instructions: coughing and deep breathing exercises, leg exercises               Please read over the following fact sheets you were given: _____________________________________________________________________             [facility] - Preparing for Surgery Before surgery, you can play an important role.  Because skin is not sterile, your skin needs to be as free of germs as possible.  You can reduce the number of germs on your skin by washing with CHG (chlorahexidine gluconate) soap before surgery.  CHG is an antiseptic cleaner which kills germs and bonds with the skin to continue killing germs even after washing. Please DO NOT use if you have an allergy to CHG or antibacterial soaps.  If your skin becomes reddened/irritated stop using the CHG and inform your nurse when you arrive at Short Stay. Do not shave  (including legs and underarms) for at least 48 hours prior to the first CHG shower.  You may shave your face/neck. Please follow these instructions carefully:  1.  Shower with CHG Soap the night before surgery and the  morning of Surgery.  2.  If you choose to wash your hair, wash your hair first as usual with your  normal  shampoo.  3.  After you shampoo, rinse your hair and body thoroughly to remove the  shampoo.                           4.  Use CHG as you would any other liquid soap.  You can apply chg directly  to the skin and wash  Gently with a scrungie or clean washcloth.  5.  Apply the CHG Soap to your body ONLY FROM THE NECK DOWN.   Do not use on face/ open                           Wound or open sores. Avoid contact with eyes, ears mouth and genitals (private parts).                       Wash face,  Genitals (private parts) with your normal soap.             6.  Wash thoroughly, paying special attention to the area where your surgery  will be performed.  7.  Thoroughly rinse your body with warm water from the neck down.  8.  DO NOT shower/wash with your normal soap after using and rinsing off  the CHG Soap.                9.  Pat yourself dry with a clean towel.            10.  Wear clean pajamas.            11.  Place clean sheets on your bed the night of your first shower and do not  sleep with pets. Day of Surgery : Do not apply any lotions/deodorants the morning of surgery.  Please wear clean clothes to the hospital/surgery center.  FAILURE TO FOLLOW THESE INSTRUCTIONS MAY RESULT IN THE CANCELLATION OF YOUR SURGERY PATIENT SIGNATURE_________________________________  NURSE SIGNATURE__________________________________  ________________________________________________________________________

## 2015-01-18 ENCOUNTER — Encounter (HOSPITAL_COMMUNITY)
Admission: RE | Admit: 2015-01-18 | Discharge: 2015-01-18 | Disposition: A | Discharge status: Home / Self Care | Payer: Medicare PPO | Source: Ambulatory Visit

## 2015-02-07 ENCOUNTER — Encounter (HOSPITAL_COMMUNITY): Payer: Self-pay

## 2015-02-08 ENCOUNTER — Ambulatory Visit (HOSPITAL_COMMUNITY)
Admission: RE | Admit: 2015-02-08 | Discharge: 2015-02-08 | Disposition: A | Discharge status: Home / Self Care | Payer: Medicare PPO | Source: Ambulatory Visit | Attending: Anesthesiology | Admitting: Anesthesiology

## 2015-02-08 ENCOUNTER — Encounter (HOSPITAL_COMMUNITY)
Admission: RE | Admit: 2015-02-08 | Discharge: 2015-02-08 | Disposition: A | Discharge status: Home / Self Care | Payer: Medicare PPO | Source: Ambulatory Visit | Attending: Urology | Admitting: Urology

## 2015-02-08 ENCOUNTER — Encounter (HOSPITAL_COMMUNITY): Payer: Self-pay

## 2015-02-08 DIAGNOSIS — J9811 Atelectasis: Secondary | ICD-10-CM | POA: Insufficient documentation

## 2015-02-08 DIAGNOSIS — R938 Abnormal findings on diagnostic imaging of other specified body structures: Secondary | ICD-10-CM | POA: Diagnosis not present

## 2015-02-08 DIAGNOSIS — Z01812 Encounter for preprocedural laboratory examination: Secondary | ICD-10-CM | POA: Insufficient documentation

## 2015-02-08 DIAGNOSIS — Z01818 Encounter for other preprocedural examination: Secondary | ICD-10-CM | POA: Diagnosis not present

## 2015-02-08 DIAGNOSIS — Z01810 Encounter for preprocedural cardiovascular examination: Secondary | ICD-10-CM | POA: Insufficient documentation

## 2015-02-08 DIAGNOSIS — R9389 Abnormal findings on diagnostic imaging of other specified body structures: Secondary | ICD-10-CM

## 2015-02-08 DIAGNOSIS — N200 Calculus of kidney: Secondary | ICD-10-CM | POA: Diagnosis not present

## 2015-02-08 HISTORY — DX: Calculus of kidney: N20.0

## 2015-02-08 HISTORY — DX: Unspecified asthma, uncomplicated: J45.909

## 2015-02-08 HISTORY — DX: Hesitancy of micturition: R39.11

## 2015-02-08 HISTORY — DX: Other abnormalities of gait and mobility: R26.89

## 2015-02-08 HISTORY — DX: Unspecified osteoarthritis, unspecified site: M19.90

## 2015-02-08 LAB — COMPREHENSIVE METABOLIC PANEL
ALBUMIN: 4.2 g/dL (ref 3.5–5.0)
ALK PHOS: 33 U/L — AB (ref 38–126)
ALT: 26 U/L (ref 17–63)
AST: 21 U/L (ref 15–41)
Anion gap: 9 (ref 5–15)
BILIRUBIN TOTAL: 1.2 mg/dL (ref 0.3–1.2)
BUN: 10 mg/dL (ref 6–20)
CALCIUM: 9.8 mg/dL (ref 8.9–10.3)
CO2: 28 mmol/L (ref 22–32)
CREATININE: 0.98 mg/dL (ref 0.61–1.24)
Chloride: 104 mmol/L (ref 101–111)
GFR calc Af Amer: >60 mL/min (ref >60)
GLUCOSE: 91 mg/dL (ref 65–99)
POTASSIUM: 3.8 mmol/L (ref 3.5–5.1)
Sodium: 141 mmol/L (ref 135–145)
Total Protein: 7.8 g/dL (ref 6.5–8.1)

## 2015-02-08 LAB — CBC
HEMATOCRIT: 44.7 % (ref 39.0–52.0)
HEMOGLOBIN: 14.7 g/dL (ref 13.0–17.0)
MCH: 30.2 pg (ref 26.0–34.0)
MCHC: 32.9 g/dL (ref 30.0–36.0)
MCV: 91.8 fL (ref 78.0–100.0)
Platelets: 269 10*3/uL (ref 150–400)
RBC: 4.87 MIL/uL (ref 4.22–5.81)
RDW: 13.8 % (ref 11.5–15.5)
WBC: 6.7 10*3/uL (ref 4.0–10.5)

## 2015-02-08 NOTE — Patient Instructions (Addendum)
Stana Buntingrofandro W Marcotte  02/08/2015   Your procedure is scheduled on: Friday February 11, 2015   [facility]  [facility]  elevators to 3rd floor to  Short Stay Center at 8:30 AM.  Call this number if you have problems the morning of surgery [phone]   Remember: ONLY 1 PERSON MAY GO WITH YOU TO SHORT STAY TO GET  READY MORNING OF YOUR SURGERY.  Do not eat food or drink liquids :After Midnight.               FOLLOW BOWEL PREPARATION INSTRUCTIONS PER SURGEON PRIOR TO SURGICAL DATE    Take these medicines the morning of surgery with A SIP OF WATER: Amlodipine (Norvasc); Hydrocodone-Acetaminophen if needed; Tamsulosin (Flomax)                                You may not have any metal on your body including hair pins and              piercings  Do not wear jewelry,  lotions, powders or colognes, deodorant                           Men may shave face and neck.   Do not bring valuables to the hospital. [place] IS NOT             RESPONSIBLE   FOR VALUABLES.  Contacts, dentures or bridgework may not be worn into surgery.    Patients discharged the day of surgery will not be allowed to drive home.  Name and phone number of your driver:Gregory Zonia KiefStephens (brother)  _____________________________________________________________________             [facility] - Preparing for Surgery Before surgery, you can play an important role.  Because skin is not sterile, your skin needs to be as free of germs as possible.  You can reduce the number of germs on your skin by washing with CHG (chlorahexidine gluconate) soap before surgery.  CHG is an antiseptic cleaner which kills germs and bonds with the skin to continue killing germs even after washing. Please DO NOT use if you have an allergy to CHG or antibacterial soaps.  If your skin becomes reddened/irritated stop using the CHG and inform your nurse when you arrive at Short Stay. Do not shave (including  legs and underarms) for at least 48 hours prior to the first CHG shower.  You may shave your face/neck. Please follow these instructions carefully:  1.  Shower with CHG Soap the night before surgery and the  morning of Surgery.  2.  If you choose to wash your hair, wash your hair first as usual with your  normal  shampoo.  3.  After you shampoo, rinse your hair and body thoroughly to remove the  shampoo.                           4.  Use CHG as you would any other liquid soap.  You can apply chg directly  to the skin and wash                       Gently with a scrungie or clean washcloth.  5.  Apply the CHG Soap to your body ONLY FROM THE NECK DOWN.   Do not use on face/ open                           Wound or open sores. Avoid contact with eyes, ears mouth and genitals (private parts).                       Wash face,  Genitals (private parts) with your normal soap.             6.  Wash thoroughly, paying special attention to the area where your surgery  will be performed.  7.  Thoroughly rinse your body with warm water from the neck down.  8.  DO NOT shower/wash with your normal soap after using and rinsing off  the CHG Soap.                9.  Pat yourself dry with a clean towel.            10.  Wear clean pajamas.            11.  Place clean sheets on your bed the night of your first shower and do not  sleep with pets. Day of Surgery : Do not apply any lotions/deodorants the morning of surgery.  Please wear clean clothes to the hospital/surgery center.  FAILURE TO FOLLOW THESE INSTRUCTIONS MAY RESULT IN THE CANCELLATION OF YOUR SURGERY PATIENT SIGNATURE_________________________________  NURSE SIGNATURE__________________________________  ________________________________________________________________________

## 2015-02-09 ENCOUNTER — Telehealth: Payer: Self-pay | Admitting: Internal Medicine

## 2015-02-09 NOTE — Telephone Encounter (Signed)
He is clear for surgery Thx

## 2015-02-09 NOTE — Telephone Encounter (Signed)
Kim from Dr. Imelda Pillowannenbaum's office called regarding getting pt in for a surgery clearance appt for his surgery on Friday. There are no openings tomorrow.  He was just in on 10/25.  Please advise

## 2015-02-09 NOTE — Progress Notes (Signed)
CXR results/epic 02/08/2015 sent to Dr Patsi Searsannenbaum.

## 2015-02-09 NOTE — Progress Notes (Signed)
Dr Jannifer Rodneyurk/anesthesia reviewed pts H&P, EKGS/epic from 02/08/2015 and 08/02/2010 and CXR / epic 02/08/2015. No orders given. Anesthesia to see pt day of surgery.

## 2015-02-10 NOTE — Telephone Encounter (Signed)
Lm at office to inform Gordon BattenKim

## 2015-02-11 ENCOUNTER — Encounter (HOSPITAL_COMMUNITY)
Admission: RE | Disposition: A | Discharge status: Home / Self Care | Payer: Self-pay | Source: Ambulatory Visit | Attending: Urology

## 2015-02-11 ENCOUNTER — Ambulatory Visit (HOSPITAL_COMMUNITY): Payer: Medicare PPO | Admitting: Registered Nurse

## 2015-02-11 ENCOUNTER — Ambulatory Visit (HOSPITAL_BASED_OUTPATIENT_CLINIC_OR_DEPARTMENT_OTHER): Admit: 2015-02-11 | Payer: Medicare PPO | Admitting: Urology

## 2015-02-11 ENCOUNTER — Ambulatory Visit (HOSPITAL_BASED_OUTPATIENT_CLINIC_OR_DEPARTMENT_OTHER): Payer: Medicare PPO | Admitting: Anesthesiology

## 2015-02-11 ENCOUNTER — Ambulatory Visit (HOSPITAL_COMMUNITY)
Admission: RE | Admit: 2015-02-11 | Discharge: 2015-02-11 | Disposition: A | Discharge status: Home / Self Care | Payer: Medicare PPO | Source: Ambulatory Visit | Attending: Urology | Admitting: Urology

## 2015-02-11 ENCOUNTER — Encounter (HOSPITAL_COMMUNITY): Payer: Self-pay

## 2015-02-11 ENCOUNTER — Ambulatory Visit (HOSPITAL_COMMUNITY): Payer: Medicare PPO

## 2015-02-11 DIAGNOSIS — I129 Hypertensive chronic kidney disease with stage 1 through stage 4 chronic kidney disease, or unspecified chronic kidney disease: Secondary | ICD-10-CM | POA: Diagnosis not present

## 2015-02-11 DIAGNOSIS — Z87440 Personal history of urinary (tract) infections: Secondary | ICD-10-CM | POA: Insufficient documentation

## 2015-02-11 DIAGNOSIS — Z79899 Other long term (current) drug therapy: Secondary | ICD-10-CM | POA: Diagnosis not present

## 2015-02-11 DIAGNOSIS — N132 Hydronephrosis with renal and ureteral calculous obstruction: Secondary | ICD-10-CM | POA: Diagnosis not present

## 2015-02-11 DIAGNOSIS — N202 Calculus of kidney with calculus of ureter: Secondary | ICD-10-CM | POA: Diagnosis not present

## 2015-02-11 DIAGNOSIS — R310 Gross hematuria: Secondary | ICD-10-CM | POA: Diagnosis not present

## 2015-02-11 DIAGNOSIS — I10 Essential (primary) hypertension: Secondary | ICD-10-CM | POA: Diagnosis not present

## 2015-02-11 DIAGNOSIS — M1990 Unspecified osteoarthritis, unspecified site: Secondary | ICD-10-CM | POA: Diagnosis not present

## 2015-02-11 DIAGNOSIS — N138 Other obstructive and reflux uropathy: Secondary | ICD-10-CM | POA: Insufficient documentation

## 2015-02-11 DIAGNOSIS — N201 Calculus of ureter: Secondary | ICD-10-CM | POA: Diagnosis not present

## 2015-02-11 DIAGNOSIS — J45909 Unspecified asthma, uncomplicated: Secondary | ICD-10-CM | POA: Diagnosis not present

## 2015-02-11 DIAGNOSIS — J449 Chronic obstructive pulmonary disease, unspecified: Secondary | ICD-10-CM | POA: Diagnosis not present

## 2015-02-11 DIAGNOSIS — F17210 Nicotine dependence, cigarettes, uncomplicated: Secondary | ICD-10-CM | POA: Diagnosis not present

## 2015-02-11 DIAGNOSIS — N401 Enlarged prostate with lower urinary tract symptoms: Secondary | ICD-10-CM | POA: Diagnosis not present

## 2015-02-11 DIAGNOSIS — N323 Diverticulum of bladder: Secondary | ICD-10-CM | POA: Insufficient documentation

## 2015-02-11 DIAGNOSIS — R39198 Other difficulties with micturition: Secondary | ICD-10-CM | POA: Insufficient documentation

## 2015-02-11 DIAGNOSIS — N200 Calculus of kidney: Secondary | ICD-10-CM

## 2015-02-11 DIAGNOSIS — N189 Chronic kidney disease, unspecified: Secondary | ICD-10-CM | POA: Diagnosis not present

## 2015-02-11 HISTORY — PX: CYSTOSCOPY WITH RETROGRADE PYELOGRAM, URETEROSCOPY AND STENT PLACEMENT: SHX5789

## 2015-02-11 HISTORY — PX: TRANSURETHRAL RESECTION OF BLADDER TUMOR: SHX2575

## 2015-02-11 HISTORY — PX: HOLMIUM LASER APPLICATION: SHX5852

## 2015-02-11 SURGERY — CYSTOSCOPY, WITH BLADDER CALCULUS LITHOLAPAXY
Anesthesia: General

## 2015-02-11 SURGERY — TURBT (TRANSURETHRAL RESECTION OF BLADDER TUMOR)
Anesthesia: General

## 2015-02-11 MED ORDER — HYDROCODONE-ACETAMINOPHEN 7.5-325 MG PO TABS
ORAL_TABLET | ORAL | Status: AC
  Filled 2015-02-11: qty 1

## 2015-02-11 MED ORDER — FENTANYL CITRATE (PF) 100 MCG/2ML IJ SOLN
INTRAMUSCULAR | Status: DC | PRN
  Administered 2015-02-11 (×8): 25 ug via INTRAVENOUS

## 2015-02-11 MED ORDER — ACETAMINOPHEN 10 MG/ML IV SOLN
INTRAVENOUS | Status: DC | PRN
  Administered 2015-02-11: 1000 mg via INTRAVENOUS

## 2015-02-11 MED ORDER — DIPHENHYDRAMINE HCL 25 MG PO CAPS: 25.0000 mg | ORAL_CAPSULE | ORAL | Status: DC

## 2015-02-11 MED ORDER — UROGESIC-BLUE 81.6 MG PO TABS: ORAL_TABLET | ORAL | Status: DC

## 2015-02-11 MED ORDER — BELLADONNA ALKALOIDS-OPIUM 16.2-60 MG RE SUPP
RECTAL | Status: AC
  Filled 2015-02-11: qty 1

## 2015-02-11 MED ORDER — ONDANSETRON HCL 4 MG/2ML IJ SOLN
INTRAMUSCULAR | Status: DC | PRN
  Administered 2015-02-11: 4 mg via INTRAVENOUS

## 2015-02-11 MED ORDER — ONDANSETRON HCL 4 MG/2ML IJ SOLN
INTRAMUSCULAR | Status: AC
  Filled 2015-02-11: qty 2

## 2015-02-11 MED ORDER — LACTATED RINGERS IV SOLN: INTRAVENOUS | Status: DC

## 2015-02-11 MED ORDER — CEFAZOLIN SODIUM-DEXTROSE 2-3 GM-% IV SOLR
INTRAVENOUS | Status: AC
  Filled 2015-02-11: qty 50

## 2015-02-11 MED ORDER — FENTANYL CITRATE (PF) 100 MCG/2ML IJ SOLN
INTRAMUSCULAR | Status: AC
  Filled 2015-02-11: qty 2

## 2015-02-11 MED ORDER — MIDAZOLAM HCL 2 MG/2ML IJ SOLN
INTRAMUSCULAR | Status: AC
  Filled 2015-02-11: qty 2

## 2015-02-11 MED ORDER — DEXAMETHASONE SODIUM PHOSPHATE 10 MG/ML IJ SOLN
INTRAMUSCULAR | Status: AC
  Filled 2015-02-11: qty 1

## 2015-02-11 MED ORDER — BELLADONNA ALKALOIDS-OPIUM 16.2-60 MG RE SUPP
RECTAL | Status: DC | PRN
  Administered 2015-02-11: 1 via RECTAL

## 2015-02-11 MED ORDER — URELLE 81 MG PO TABS
1.0000 | ORAL_TABLET | Freq: Four times a day (QID) | ORAL | Status: DC
  Administered 2015-02-11: 81 mg via ORAL

## 2015-02-11 MED ORDER — OXYCODONE-ACETAMINOPHEN 5-325 MG PO TABS: 1.0000 | ORAL_TABLET | ORAL | Status: DC | PRN

## 2015-02-11 MED ORDER — MIDAZOLAM HCL 5 MG/5ML IJ SOLN
INTRAMUSCULAR | Status: DC | PRN
  Administered 2015-02-11: 2 mg via INTRAVENOUS

## 2015-02-11 MED ORDER — PROPOFOL 10 MG/ML IV BOLUS
INTRAVENOUS | Status: AC
  Filled 2015-02-11: qty 20

## 2015-02-11 MED ORDER — OXYCODONE HCL 5 MG PO TABS
ORAL_TABLET | ORAL | Status: AC
  Filled 2015-02-11: qty 1

## 2015-02-11 MED ORDER — LIDOCAINE HCL (CARDIAC) 20 MG/ML IV SOLN
INTRAVENOUS | Status: DC | PRN
  Administered 2015-02-11: 100 mg via INTRAVENOUS

## 2015-02-11 MED ORDER — LIDOCAINE HCL (CARDIAC) 20 MG/ML IV SOLN
INTRAVENOUS | Status: AC
  Filled 2015-02-11: qty 5

## 2015-02-11 MED ORDER — LACTATED RINGERS IV SOLN
INTRAVENOUS | Status: DC | PRN
  Administered 2015-02-11: 11:00:00 via INTRAVENOUS

## 2015-02-11 MED ORDER — TRIMETHOPRIM 100 MG PO TABS: 100.0000 mg | ORAL_TABLET | Freq: Every day | ORAL | Status: DC

## 2015-02-11 MED ORDER — SODIUM CHLORIDE 0.9 % IV SOLN
INTRAVENOUS | Status: DC
Start: 2015-02-11 — End: 2015-02-11

## 2015-02-11 MED ORDER — FENTANYL CITRATE (PF) 100 MCG/2ML IJ SOLN: 25.0000 ug | INTRAMUSCULAR | Status: DC | PRN

## 2015-02-11 MED ORDER — CIPROFLOXACIN HCL 500 MG PO TABS: 500.0000 mg | ORAL_TABLET | ORAL | Status: DC

## 2015-02-11 MED ORDER — CEFAZOLIN SODIUM-DEXTROSE 2-3 GM-% IV SOLR
2.0000 g | INTRAVENOUS | Status: AC
  Administered 2015-02-11: 2 g via INTRAVENOUS

## 2015-02-11 MED ORDER — HYDROCODONE-ACETAMINOPHEN 7.5-325 MG PO TABS
1.0000 | ORAL_TABLET | Freq: Once | ORAL | Status: AC | PRN
  Administered 2015-02-11: 1 via ORAL

## 2015-02-11 MED ORDER — KETOROLAC TROMETHAMINE 30 MG/ML IJ SOLN
INTRAMUSCULAR | Status: AC
  Filled 2015-02-11: qty 1

## 2015-02-11 MED ORDER — SODIUM CHLORIDE 0.9 % IR SOLN
Status: DC | PRN
  Administered 2015-02-11: 6000 mL via INTRAVESICAL

## 2015-02-11 MED ORDER — LACTATED RINGERS IV SOLN
INTRAVENOUS | Status: DC
  Administered 2015-02-11 (×2): via INTRAVENOUS

## 2015-02-11 MED ORDER — URELLE 81 MG PO TABS
ORAL_TABLET | ORAL | Status: AC
  Filled 2015-02-11: qty 1

## 2015-02-11 MED ORDER — FENTANYL CITRATE (PF) 250 MCG/5ML IJ SOLN
INTRAMUSCULAR | Status: AC
  Filled 2015-02-11: qty 5

## 2015-02-11 MED ORDER — DEXAMETHASONE SODIUM PHOSPHATE 10 MG/ML IJ SOLN
INTRAMUSCULAR | Status: DC | PRN
  Administered 2015-02-11: 10 mg via INTRAVENOUS

## 2015-02-11 MED ORDER — PROPOFOL 10 MG/ML IV BOLUS
INTRAVENOUS | Status: DC | PRN
  Administered 2015-02-11: 200 mg via INTRAVENOUS

## 2015-02-11 MED ORDER — DIAZEPAM 5 MG PO TABS: 10.0000 mg | ORAL_TABLET | ORAL | Status: DC

## 2015-02-11 MED ORDER — KETOROLAC TROMETHAMINE 30 MG/ML IJ SOLN
INTRAMUSCULAR | Status: DC | PRN
  Administered 2015-02-11: 30 mg via INTRAVENOUS

## 2015-02-11 MED ORDER — PROMETHAZINE HCL 25 MG/ML IJ SOLN: 6.2500 mg | INTRAMUSCULAR | Status: DC | PRN

## 2015-02-11 MED ORDER — STERILE WATER FOR IRRIGATION IR SOLN
Status: DC | PRN
  Administered 2015-02-11: 3000 mL

## 2015-02-11 SURGICAL SUPPLY — 48 items
ADAPTER CATH URET PLST 4-6FR (CATHETERS) IMPLANT
BAG DRAIN URO-CYSTO SKYTR STRL (DRAIN) ×4 IMPLANT
BAG URINE DRAINAGE (UROLOGICAL SUPPLIES) IMPLANT
BAG URINE LEG 19OZ MD ST LTX (BAG) IMPLANT
BASKET LASER NITINOL 1.9FR (BASKET) IMPLANT
BASKET STNLS GEMINI 4WIRE 3FR (BASKET) IMPLANT
BASKET ZERO TIP NITINOL 2.4FR (BASKET) ×4 IMPLANT
BOOTIES KNEE HIGH SLOAN (MISCELLANEOUS) ×4 IMPLANT
CANISTER SUCT LVC 12 LTR MEDI- (MISCELLANEOUS) IMPLANT
CATH CLEAR GEL 3F BACKSTOP (CATHETERS) IMPLANT
CATH FOLEY 2WAY SLVR  5CC 20FR (CATHETERS)
CATH FOLEY 2WAY SLVR  5CC 22FR (CATHETERS)
CATH FOLEY 2WAY SLVR 5CC 20FR (CATHETERS) IMPLANT
CATH FOLEY 2WAY SLVR 5CC 22FR (CATHETERS) IMPLANT
CATH INTERMIT  6FR 70CM (CATHETERS) IMPLANT
CATH URET 5FR 28IN CONE TIP (BALLOONS)
CATH URET 5FR 28IN OPEN ENDED (CATHETERS) IMPLANT
CATH URET 5FR 70CM CONE TIP (BALLOONS) IMPLANT
CATH URET DUAL LUMEN 6-10FR 50 (CATHETERS) IMPLANT
CLOTH BEACON ORANGE TIMEOUT ST (SAFETY) ×4 IMPLANT
ELECT REM PT RETURN 9FT ADLT (ELECTROSURGICAL) ×4
ELECTRODE REM PT RTRN 9FT ADLT (ELECTROSURGICAL) ×2 IMPLANT
FIBER LASER TRAC TIP (UROLOGICAL SUPPLIES) ×4 IMPLANT
GLOVE BIO SURGEON STRL SZ7.5 (GLOVE) ×4 IMPLANT
GOWN STRL REUS W/ TWL LRG LVL3 (GOWN DISPOSABLE) ×2 IMPLANT
GOWN STRL REUS W/ TWL XL LVL3 (GOWN DISPOSABLE) ×2 IMPLANT
GOWN STRL REUS W/TWL LRG LVL3 (GOWN DISPOSABLE) ×2
GOWN STRL REUS W/TWL XL LVL3 (GOWN DISPOSABLE) ×2
GOWN XL W/COTTON TOWEL STD (GOWNS) ×4 IMPLANT
GUIDEWIRE 0.038 PTFE COATED (WIRE) IMPLANT
GUIDEWIRE ANG ZIPWIRE 038X150 (WIRE) ×4 IMPLANT
GUIDEWIRE STR DUAL SENSOR (WIRE) ×4 IMPLANT
HOLDER FOLEY CATH W/STRAP (MISCELLANEOUS) IMPLANT
IV NS IRRIG 3000ML ARTHROMATIC (IV SOLUTION) ×8 IMPLANT
KIT BALLIN UROMAX 15FX10 (LABEL) IMPLANT
KIT BALLN UROMAX 15FX4 (MISCELLANEOUS) IMPLANT
KIT BALLN UROMAX 26 75X4 (MISCELLANEOUS)
KIT ROOM TURNOVER WOR (KITS) ×4 IMPLANT
MANIFOLD NEPTUNE II (INSTRUMENTS) IMPLANT
PACK CYSTO (CUSTOM PROCEDURE TRAY) ×4 IMPLANT
PLUG CATH AND CAP STER (CATHETERS) IMPLANT
SET ASPIRATION TUBING (TUBING) IMPLANT
SET HIGH PRES BAL DIL (LABEL)
SHEATH ACCESS URETERAL 38CM (SHEATH) IMPLANT
STENT URET 6FRX26 CONTOUR (STENTS) ×4 IMPLANT
SYRINGE IRR TOOMEY STRL 70CC (SYRINGE) ×4 IMPLANT
TUBE CONNECTING 12'X1/4 (SUCTIONS)
TUBE CONNECTING 12X1/4 (SUCTIONS) IMPLANT

## 2015-02-11 NOTE — Anesthesia Procedure Notes (Signed)
Procedure Name: LMA Insertion Date/Time: 02/11/2015 12:15 PM Performed by: Jessica PriestBEESON, Robbi Scurlock C Pre-anesthesia Checklist: Patient identified, Emergency Drugs available, Suction available and Patient being monitored Patient Re-evaluated:Patient Re-evaluated prior to inductionOxygen Delivery Method: Circle System Utilized Preoxygenation: Pre-oxygenation with 100% oxygen Intubation Type: IV induction Ventilation: Mask ventilation without difficulty LMA: LMA inserted LMA Size: 5.0 Number of attempts: 1 Airway Equipment and Method: Bite block Placement Confirmation: positive ETCO2 Tube secured with: Tape Dental Injury: Teeth and Oropharynx as per pre-operative assessment

## 2015-02-11 NOTE — Anesthesia Preprocedure Evaluation (Addendum)
Anesthesia Evaluation  Patient identified by MRN, date of birth, ID band Patient awake    Reviewed: Allergy & Precautions, NPO status , Patient's Chart, lab work & pertinent test results  Airway Mallampati: II  TM Distance: >3 FB Neck ROM: Full    Dental  (+) Dental Advisory Given, Chipped, Missing Right upper front tooth is broken.  A few missing upper front teeth:   Pulmonary asthma , COPD, Current Smoker,    Pulmonary exam normal breath sounds clear to auscultation       Cardiovascular hypertension, Pt. on medications Normal cardiovascular exam Rhythm:Regular Rate:Normal     Neuro/Psych negative neurological ROS     GI/Hepatic negative GI ROS, Neg liver ROS,   Endo/Other  negative endocrine ROS  Renal/GU Renal disease     Musculoskeletal  (+) Arthritis ,   Abdominal   Peds  Hematology negative hematology ROS (+)   Anesthesia Other Findings   Reproductive/Obstetrics                            Anesthesia Physical Anesthesia Plan  ASA: III  Anesthesia Plan: General   Post-op Pain Management:    Induction: Intravenous  Airway Management Planned: LMA  Additional Equipment:   Intra-op Plan:   Post-operative Plan:   Informed Consent:   Plan Discussed with: Surgeon  Anesthesia Plan Comments:         Anesthesia Quick Evaluation

## 2015-02-11 NOTE — Interval H&P Note (Signed)
History and Physical Interval Note:  02/11/2015 11:49 AM  Gordon Lucas  has presented today for surgery, with the diagnosis of LEFT UPJ STONE BLADDER STONE  The various methods of treatment have been discussed with the patient and family. After consideration of risks, benefits and other options for treatment, the patient has consented to  Procedure(s): POSSIBLE CYSTOLITHOLAPAXY (N/A) POSSIBLE TRANSURETHRAL RESECTION OF BLADDER TUMOR (TURBT) LEFT RETROGRADE PYELOGRAM LEFT JJ STENT PLACEMENT  (N/A) HOLMIUM LASER APPLICATION (N/A) as a surgical intervention .  The patient's history has been reviewed, patient examined, no change in status, stable for surgery.  I have reviewed the patient's chart and labs.  Questions were answered to the patient's satisfaction.     Anthem Frazer I Jennica Tagliaferri   

## 2015-02-11 NOTE — H&P (Signed)
Reason For Visit Lt hydronephrosis   Active Problems Problems  1. Benign localized hyperplasia of prostate with urinary obstruction and lower urinary tract  symptoms (N40.1,N13.9) 2. Bladder calculus (N21.0) 3. Intermittent urinary stream (R39.13) 4. Nephrolithiasis (N20.0) 5. 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 Tobacco abuse (Z72.0)  History of Present Illness     64 yo male, last seen 10/22/06, referred back by Dr. Posey ReaPlotnikov for further evaluation of Lt hydronephrosis. CT on 11/04/14 showed moderate Lt hydronephrosis due to a 16x 11 mm UPJ stone. There were also 2 smaller Lt lower pole stones and 2 Lt-sided bladder stones with mild diffuse bladder wall thickening likely due to enlarged prostate. Hx of recurrent UTI's.     10/04/14 PSA - 1.19   Past Medical History Problems  1. History of Arthritis 2. History of Asthma (J45.909)  Surgical History Problems  1. History of No Surgical Problems  Current Meds 1. AmLODIPine Besylate 5 MG Oral Tablet;  Therapy: 09Oct2016 to Recorded 2. Cyclobenzaprine HCl - 10 MG Oral Tablet;  Therapy: (Recorded:25Oct2016) to Recorded 3. Hydrocodone-Acetaminophen 10-325 MG Oral Tablet;  Therapy: 05Oct2016 to Recorded 4. Polyethylene Glycol 3350 Oral Powder;  Therapy: 08Jul2016 to Recorded 5. Tamsulosin HCl - 0.4 MG Oral Capsule;  Therapy: 09Oct2016 to Recorded 6. Viagra 100 MG Oral Tablet;  Therapy: (Recorded:25Oct2016) to Recorded  Allergies Medication  1. No Known Drug Allergies  Family History Problems  1. Family history of Family Health Status Number Of Children   1 SON, 1 DAUGHTER  Social History Problems    Alcohol Use (History)   occasionally   Caffeine Use   1 per day   Heavy tobacco smoker (F17.200)   Occupation:   DISABLED   Tobacco Use   3 pack a week x 30+ yrs  Review of Systems Genitourinary, constitutional, skin, eye, otolaryngeal, hematologic/lymphatic,  cardiovascular, pulmonary, endocrine, musculoskeletal, gastrointestinal, neurological and psychiatric system(s) were reviewed and pertinent findings if present are noted and are otherwise negative.  Genitourinary: hematuria and erectile dysfunction.    Vitals Vital Signs [Data Includes: Last 1 Day]  Recorded: 25Oct2016 10:30AM  Height: 5 ft 7 in Weight: 185 lb  BMI Calculated: 28.98 BSA Calculated: 1.96 Blood Pressure: 123 / 83 Heart Rate: 98  Physical Exam Constitutional: Well nourished and well developed . No acute distress.  ENT:. The ears and nose are normal in appearance.  Neck: The appearance of the neck is normal and no neck mass is present.  Pulmonary: No respiratory distress and normal respiratory rhythm and effort.  Cardiovascular: Heart rate and rhythm are normal . No peripheral edema.  Abdomen: The abdomen is rounded, but not distended. The abdomen is soft and nontender. No masses are palpated. No CVA tenderness. Bowel sounds are normal. No hernias are palpable. No hepatosplenomegaly noted.  Rectal: Rectal exam demonstrates normal sphincter tone. Estimated prostate size is 4+. Normal rectal tone, no rectal masses, prostate is smooth, symmetric and non-tender.  Genitourinary: Examination of the penis demonstrates no discharge, no masses, no lesions and a normal meatus. The scrotum is without lesions. Examination of the right scrotum demonstrates no mass. Examination of the left scrotum demostrates no mass. The right epididymis is palpably normal and non-tender. The left epididymis is palpably normal and non-tender. The right testis is palpably normal, non-tender and without masses. The left testis is normal, non-tender and without masses.  Lymphatics: The femoral and inguinal nodes are not enlarged or tender.  Skin: Normal skin turgor, no visible rash  and no visible skin lesions.  Neuro/Psych:. Mood and affect are appropriate.    Results/Data Urine [Data Includes: Last 1 Day]    25Oct2016  COLOR YELLOW   APPEARANCE CLEAR   SPECIFIC GRAVITY 1.025   pH 5.0   GLUCOSE NEGATIVE   BILIRUBIN NEGATIVE   KETONE NEGATIVE   BLOOD TRACE   PROTEIN NEGATIVE   NITRITE NEGATIVE   LEUKOCYTE ESTERASE NEGATIVE   SQUAMOUS EPITHELIAL/HPF 0-5 HPF  WBC NONE SEEN WBC/HPF  RBC 0-2 RBC/HPF  BACTERIA NONE SEEN HPF  CRYSTALS NONE SEEN HPF  CASTS NONE SEEN LPF  Yeast NONE SEEN HPF   Assessment Assessed  1. Benign localized hyperplasia of prostate with urinary obstruction and lower urinary tract  symptoms (N40.1,N13.9) 2. Bladder calculus (N21.0) 3. 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 Nephrolithiasis (N20.0) 5. Heavy tobacco smoker (F17.200) 6. Tobacco abuse (Z72.0) 7. Intermittent urinary stream (R39.13)  1. BPH, well treated with tamsulosin by Dr. Posey Rea, but with 2 bladder stones, and apparent bladder tic. The stones do not appear to be mobile, and I am concerned with the thickened bladder wall, and his hx of 50 pk-yr hx of tobacco abuse-that he may have TCC under the bladder Ca++, he will need removal of the bladder stones, and possiblly bladder bx and possibly TUR-BT. Urine cytology/Fish ordered.  2. We have reviewed his CT together, and note that he has a 16 x 11 L UPJ stone, with partial UPJ obstruction. He weighed 210 lbs, and now weighs 185 lbs ( 2 years). We have considered options for his UPJ stone. He has a measurement opf 13.1cm from stone to skin, and density of 1152 HU. We have discussed the option of Lithotripsy, and that I think he has a reduced chance of crushing the stone ( probably 50%-65%) with lithotripsy, that with percutaneous approach; although it would be much easier for him to try lithotripsy first.    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 He may have bladder Ca++, and., in view of his tobacco abuse, I think he should have urine cytology/FISH today,  and cysto, possible bladder bx, possible TUR-BT, and will JJ stenting,and lithotripsy of the LLP stones x 2. He will eventually need litholink. also.   Plan Health Maintenance  1. UA With REFLEX; [Do Not Release]; Status:Resulted - Requires Verification;   Done:  25Oct2016 10:20AM Tobacco abuse  2. URINE CYTOLOGY W/REFLEX FISH; [Do Not Release]; Status:Hold For -  Specimen/Data Collection,Appointment; Requested for:25Oct2016;   1. Cysto, cytology/FISH  2. Continue tamsulosin  3. Review CT scan with Radiology: appears to be bladder tic.   Discussion/Summary cc: Sonda Primes     Signatures Electronically signed by : Jethro Bolus, M.D.; Jan 04 2015 11:45AM EST

## 2015-02-11 NOTE — Discharge Instructions (Addendum)
Kidney Stones °Kidney stones (urolithiasis) are deposits that form inside your kidneys. The intense pain is caused by the stone moving through the urinary tract. When the stone moves, the ureter goes into spasm around the stone. The stone is usually passed in the urine.  °CAUSES  °1. A disorder that makes certain neck glands produce too much parathyroid hormone (primary hyperparathyroidism). °2. A buildup of uric acid crystals, similar to gout in your joints. °3. Narrowing (stricture) of the ureter. °4. A kidney obstruction present at birth (congenital obstruction). °5. Previous surgery on the kidney or ureters. °6. Numerous kidney infections. °SYMPTOMS  °· Feeling sick to your stomach (nauseous). °· Throwing up (vomiting). °· Blood in the urine (hematuria). °· Pain that usually spreads (radiates) to the groin. °· Frequency or urgency of urination. °DIAGNOSIS  °· Taking a history and physical exam. °· Blood or urine tests. °· CT scan. °· Occasionally, an examination of the inside of the urinary bladder (cystoscopy) is performed. °TREATMENT  °· Observation. °· Increasing your fluid intake. °· Extracorporeal shock wave lithotripsy--This is a noninvasive procedure that uses shock waves to break up kidney stones. °· Surgery may be needed if you have severe pain or persistent obstruction. There are various surgical procedures. Most of the procedures are performed with the use of small instruments. Only small incisions are needed to accommodate these instruments, so recovery time is minimized. °The size, location, and chemical composition are all important variables that will determine the proper choice of action for you. Talk to your health care provider to better understand your situation so that you will minimize the risk of injury to yourself and your kidney.  °HOME CARE INSTRUCTIONS  °· Drink enough water and fluids to keep your urine clear or pale yellow. This will help you to pass the stone or stone  fragments. °· Strain all urine through the provided strainer. Keep all particulate matter and stones for your health care provider to see. The stone causing the pain may be as small as a grain of salt. It is very important to use the strainer each and every time you pass your urine. The collection of your stone will allow your health care provider to analyze it and verify that a stone has actually passed. The stone analysis will often identify what you can do to reduce the incidence of recurrences. °· Only take over-the-counter or prescription medicines for pain, discomfort, or fever as directed by your health care provider. °· Keep all follow-up visits as told by your health care provider. This is important. °· Get follow-up X-rays if required. The absence of pain does not always mean that the stone has passed. It may have only stopped moving. If the urine remains completely obstructed, it can cause loss of kidney function or even complete destruction of the kidney. It is your responsibility to make sure X-rays and follow-ups are completed. Ultrasounds of the kidney can show blockages and the status of the kidney. Ultrasounds are not associated with any radiation and can be performed easily in a matter of minutes. °· Make changes to your daily diet as told by your health care provider. You may be told to: °¨ Limit the amount of salt that you eat. °¨ Eat 5 or more servings of fruits and vegetables each day. °¨ Limit the amount of meat, poultry, fish, and eggs that you eat. °· Collect a 24-hour urine sample as told by your health care provider. You may need to collect another urine sample every 6-12   months. °SEEK MEDICAL CARE IF: °· You experience pain that is progressive and unresponsive to any pain medicine you have been prescribed. °SEEK IMMEDIATE MEDICAL CARE IF:  °· Pain cannot be controlled with the prescribed medicine. °· You have a fever or shaking chills. °· The severity or intensity of pain increases over  18 hours and is not relieved by pain medicine. °· You develop a new onset of abdominal pain. °· You feel faint or pass out. °· You are unable to urinate. °  °This information is not intended to replace advice given to you by your health care provider. Make sure you discuss any questions you have with your health care provider. °  °Document Released: 02/26/2005 Document Revised: 11/17/2014 Document Reviewed: 07/30/2012 °Elsevier Interactive Patient Education ©2016 [employer] ° °[facility] °[phone] °Post Ureteroscopy With or Without Stent Instructions ° °Definitions: ° °Ureter: The duct that transports urine from the kidney to the bladder. °Stent:   A plastic hollow tube that is placed into the ureter, from the kidney to the                 bladder to prevent the ureter from swelling shut. ° °GENERAL INSTRUCTIONS: ° °Despite the fact that no skin incisions were used, the area around the ureter and bladder is raw and irritated. The stent is a foreign body which will further irritate the bladder wall. This irritation is manifested by increased frequency of urination, both day and night, and by an increase in the urge to urinate. In some, the urge to urinate is present almost always. Sometimes the urge is strong enough that you may not be able to stop yourself from urinating. The only real cure is to remove the stent and then give time for the bladder wall to heal which can't be done until the danger of the ureter swelling shut has passed, which varies. ° °You may see some blood in your urine while the stent is in place and a few days afterwards. Do not be alarmed, even if the urine was clear for a while. Get off your feet and drink lots of fluids until clearing occurs. If you start to pass clots or don't improve, call us. ° °DIET: °You may return to your normal diet immediately. Because of the raw surface of your bladder, alcohol, spicy foods, acid type foods and drinks with caffeine may cause  irritation or frequency and should be used in moderation. To keep your urine flowing freely and to avoid constipation, drink plenty of fluids during the day ( 8-10 glasses ). °Tip: Avoid cranberry juice because it is very acidic. ° °ACTIVITY: °Your physical activity doesn't need to be restricted. However, if you are very active, you may see some blood in your urine. We suggest that you reduce your activity under these circumstances until the bleeding has stopped. ° °BOWELS: °It is important to keep your bowels regular during the postoperative period. Straining with bowel movements can cause bleeding. A bowel movement every other day is reasonable. Use a mild laxative if needed, such as Milk of Magnesia 2-3 tablespoons, or 2 Dulcolax tablets. Call if you continue to have problems. If you have been taking narcotics for pain, before, during or after your surgery, you may be constipated. Take a laxative if necessary. ° ° °MEDICATION: °You should resume your pre-surgery medications unless told not to. In addition you will often be given an antibiotic to prevent infection. These should be taken as prescribed until the bottles   are finished unless you are having an unusual reaction to one of the drugs. ° °PROBLEMS YOU SHOULD REPORT TO US: °· Fevers over 100.5 Fahrenheit. °· Heavy bleeding, or clots ( See above notes about blood in urine ). °· Inability to urinate. °· Drug reactions ( hives, rash, nausea, vomiting, diarrhea ). °· Severe burning or pain with urination that is not improving. ° °FOLLOW-UP: °You will need a follow-up appointment to monitor your progress. Call for this appointment at the number listed above. Usually the first appointment will be about three to fourteen days after your surgery. ° ° ° °Post Anesthesia Home Care Instructions ° °Activity: °Get plenty of rest for the remainder of the day. A responsible adult should stay with you for 24 hours following the procedure.  °For the next 24 hours, DO  NOT: °-Drive a car °-Operate machinery °-Drink alcoholic beverages °-Take any medication unless instructed by your physician °-Make any legal decisions or sign important papers. ° °Meals: °Start with liquid foods such as gelatin or soup. Progress to regular foods as tolerated. Avoid greasy, spicy, heavy foods. If nausea and/or vomiting occur, drink only clear liquids until the nausea and/or vomiting subsides. Call your physician if vomiting continues. ° °Special Instructions/Symptoms: °Your throat may feel dry or sore from the anesthesia or the breathing tube placed in your throat during surgery. If this causes discomfort, gargle with warm salt water. The discomfort should disappear within 24 hours. ° °If you had a scopolamine patch placed behind your ear for the management of post- operative nausea and/or vomiting: ° °1. The medication in the patch is effective for 72 hours, after which it should be removed.  Wrap patch in a tissue and discard in the trash. Wash hands thoroughly with soap and water. °2. You may remove the patch earlier than 72 hours if you experience unpleasant side effects which may include dry mouth, dizziness or visual disturbances. °3. Avoid touching the patch. Wash your hands with soap and water after contact with the patch. °  ° °

## 2015-02-11 NOTE — Anesthesia Postprocedure Evaluation (Signed)
Anesthesia Post Note  Patient: Stana Buntingrofandro W Sauseda  Procedure(s) Performed: Procedure(s) (LRB): RIGID AND FLEXIBLE CYSTOSCOPY WITH LEFT RETROGRADE PYELOGRAM, SEMI RIGID URETEROSCOPY WITH LEFT RETROGRADE, LASER TREATMENT OF URETERAL STONE WITH STENT PLACEMENT (Left) HOLMIUM LASER APPLICATION (N/A)  Patient location during evaluation: PACU Anesthesia Type: General Level of consciousness: awake and alert Pain management: pain level controlled Vital Signs Assessment: post-procedure vital signs reviewed and stable Respiratory status: spontaneous breathing, nonlabored ventilation, respiratory function stable and patient connected to nasal cannula oxygen Cardiovascular status: blood pressure returned to baseline and stable Postop Assessment: no signs of nausea or vomiting Anesthetic complications: no    Last Vitals:  Filed Vitals:   02/11/15 1345 02/11/15 1400  BP: 132/89 139/82  Pulse: 77 78  Temp:    Resp: 13 19    Last Pain: There were no vitals filed for this visit.               Tessla Spurling L

## 2015-02-11 NOTE — Op Note (Signed)
Pre-operative diagnosis :   Gross hematuria, large left renal pelvic stone  Postoperative diagnosis:  Left midureteral calculus, left renal pelvic stone  Operation: First stage procedure, with Cystourethroscopy, rigid and flexible, left retro-pyelogram with interpretation, left rigid ureteroscopy, identification of left midureteral calculus, with manipulation of stone to the left ureteropelvic junction, laser fragmentation of the left ureter ureteropelvic junction stone with basket extraction of fragments, left double-J stent, identification of large left renal pelvic stone    Surgeon:  S. Patsi Searsannenbaum, MD  First assistant:  None   Anesthesia:  Gen. LMA   Preparation: After appropriate anesthesia, the patient was brought the operating, placed on the operative table in the dorsosupine position with general LMA anesthesia was introduced. He was then replaced in the dorsal lithotomy position with pubis was prepped with Betadine solution and draped in usual fashion. The left arm was previously marked, left flank was marked. CT scan was reviewed. History was reviewed.   Review history:  1. 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 Bladder calculus (N21.0) 3. Intermittent urinary stream (R39.13) 4. Nephrolithiasis (N20.0) 5. 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 Tobacco abuse (Z72.0)  History of Present Illness    64 yo male, last seen 10/22/06, referred back by Dr. Posey ReaPlotnikov for further evaluation of Lt hydronephrosis. CT on 11/04/14 showed moderate Lt hydronephrosis due to a 16x 11 mm UPJ stone. There were also 2 smaller Lt lower pole stones and 2 Lt-sided bladder stones with mild diffuse bladder wall thickening likely due to enlarged prostate. Hx of recurrent UTI's.     10/04/14 PSA - 1.19    Statement of  Likelihood of Success: Excellent. TIME-OUT observed.:  Procedure:   Cystourethroscopy was a Compass, with the finding of BPH, with the bilobar BPH. The patient had 2 bladder left wall bladder diverticula. The patient went both Rigiflex will cystoscopy. I was concerned the patient may have cancer within his bladder because of gross hematuria and a large history of 5 smoking. However, I could find no evidence of bladder cancer in the bladder mucosa, or within the bladder diverticula.  Left wrist with progress to 4, which showed solid firm mass in the left mid ureter, above which I could not pass the open-ended catheter. Guidewire did not pass easily above this either. Therefore, a 4.5 [place] ureteroscope was passed, and the stone was flushed at to the level of the ureteropelvic junction. A guidewire was then passed into the kidney, where a 2.5 cm renal pelvic stone was identified proceed by CT scan. At the UPJ, the stone was identified, and using it 200  fiber, laser fragmentation of stone was accomplished. Fragments of stone were removed in photo documentation was accomplished. A left double-J stent was placed, 6 [place] by 26 cm and coiled in the bladder. The stent the stent was coiled beneath the renal pelvic stone. The patient was given IV Tylenol and IV Toradol. He was awakened and taken to recovery room in good condition.

## 2015-02-11 NOTE — Transfer of Care (Signed)
Immediate Anesthesia Transfer of Care Note  Patient: Stana Buntingrofandro W Manter  Procedure(s) Performed: Procedure(s) (LRB): RIGID AND FLEXIBLE CYSTOSCOPY WITH LEFT RETROGRADE PYELOGRAM, SEMI RIGID URETEROSCOPY WITH LEFT RETROGRADE, LASER TREATMENT OF URETERAL STONE WITH STENT PLACEMENT (Left) HOLMIUM LASER APPLICATION (N/A)  Patient Location: PACU  Anesthesia Type: General  Level of Consciousness: awake, sedated, patient cooperative and responds to stimulation  Airway & Oxygen Therapy: Patient Spontanous Breathing and Patient connected to face mask oxygen  Post-op Assessment: Report given to PACU RN, Post -op Vital signs reviewed and stable and Patient moving all extremities  Post vital signs: Reviewed and stable  Complications: No apparent anesthesia complications

## 2015-02-11 NOTE — Interval H&P Note (Signed)
History and Physical Interval Note:  02/11/2015 11:53 AM  Gordon Lucas  has presented today for surgery, with the diagnosis of LEFT UPJ STONE BLADDER STONE  The various methods of treatment have been discussed with the patient and family. After consideration of risks, benefits and other options for treatment, the patient has consented to  Procedure(s): POSSIBLE CYSTOLITHOLAPAXY (N/A) POSSIBLE TRANSURETHRAL RESECTION OF BLADDER TUMOR (TURBT) LEFT RETROGRADE PYELOGRAM LEFT JJ STENT PLACEMENT  (N/A) HOLMIUM LASER APPLICATION (N/A) as a surgical intervention .  The patient's history has been reviewed, patient examined, no change in status, stable for surgery.  I have reviewed the patient's chart and labs.  Questions were answered to the patient's satisfaction.     Wilkin Lippy I Minh Roanhorse

## 2015-02-11 NOTE — Interval H&P Note (Signed)
History and Physical Interval Note:  02/11/2015 11:49 AM  Gordon Lucas  has presented today for surgery, with the diagnosis of LEFT UPJ STONE BLADDER STONE  The various methods of treatment have been discussed with the patient and family. After consideration of risks, benefits and other options for treatment, the patient has consented to  Procedure(s): POSSIBLE CYSTOLITHOLAPAXY (N/A) POSSIBLE TRANSURETHRAL RESECTION OF BLADDER TUMOR (TURBT) LEFT RETROGRADE PYELOGRAM LEFT JJ STENT PLACEMENT  (N/A) HOLMIUM LASER APPLICATION (N/A) as a surgical intervention .  The patient's history has been reviewed, patient examined, no change in status, stable for surgery.  I have reviewed the patient's chart and labs.  Questions were answered to the patient's satisfaction.     Devi Hopman I Erland Vivas

## 2015-02-11 NOTE — Anesthesia Preprocedure Evaluation (Signed)
Anesthesia Evaluation  Patient identified by MRN, date of birth, ID band Patient awake    Reviewed: Allergy & Precautions, NPO status , Patient's Chart, lab work & pertinent test results  Airway Mallampati: II  TM Distance: >3 FB Neck ROM: Full    Dental  (+) Dental Advisory Given   Pulmonary asthma , COPD, Current Smoker,    breath sounds clear to auscultation       Cardiovascular hypertension, Pt. on medications  Rhythm:Regular Rate:Normal     Neuro/Psych negative neurological ROS     GI/Hepatic negative GI ROS, Neg liver ROS,   Endo/Other  negative endocrine ROS  Renal/GU Renal disease     Musculoskeletal  (+) Arthritis ,   Abdominal   Peds  Hematology negative hematology ROS (+)   Anesthesia Other Findings   Reproductive/Obstetrics                             Lab Results  Component Value Date   WBC 6.7 02/08/2015   HGB 14.7 02/08/2015   HCT 44.7 02/08/2015   MCV 91.8 02/08/2015   PLT 269 02/08/2015   Lab Results  Component Value Date   CREATININE 0.98 02/08/2015   BUN 10 02/08/2015   NA 141 02/08/2015   K 3.8 02/08/2015   CL 104 02/08/2015   CO2 28 02/08/2015    Anesthesia Physical Anesthesia Plan  ASA: III  Anesthesia Plan: General   Post-op Pain Management:    Induction: Intravenous  Airway Management Planned: Oral ETT  Additional Equipment:   Intra-op Plan:   Post-operative Plan: Extubation in OR  Informed Consent: I have reviewed the patients History and Physical, chart, labs and discussed the procedure including the risks, benefits and alternatives for the proposed anesthesia with the patient or authorized representative who has indicated his/her understanding and acceptance.   Dental advisory given  Plan Discussed with: CRNA  Anesthesia Plan Comments:         Anesthesia Quick Evaluation

## 2015-02-14 ENCOUNTER — Encounter (HOSPITAL_BASED_OUTPATIENT_CLINIC_OR_DEPARTMENT_OTHER): Payer: Self-pay | Admitting: Urology

## 2015-02-16 ENCOUNTER — Other Ambulatory Visit: Payer: Self-pay | Admitting: Urology

## 2015-02-17 ENCOUNTER — Encounter (HOSPITAL_COMMUNITY): Payer: Self-pay | Admitting: General Practice

## 2015-02-17 ENCOUNTER — Ambulatory Visit (HOSPITAL_COMMUNITY)
Admission: RE | Admit: 2015-02-17 | Discharge: 2015-02-17 | Disposition: A | Discharge status: Home / Self Care | Payer: Medicare PPO | Source: Ambulatory Visit | Attending: Urology | Admitting: Urology

## 2015-02-17 ENCOUNTER — Encounter (HOSPITAL_COMMUNITY)
Admission: RE | Disposition: A | Discharge status: Home / Self Care | Payer: Self-pay | Source: Ambulatory Visit | Attending: Urology

## 2015-02-17 ENCOUNTER — Ambulatory Visit (HOSPITAL_COMMUNITY): Payer: Medicare PPO

## 2015-02-17 DIAGNOSIS — F17200 Nicotine dependence, unspecified, uncomplicated: Secondary | ICD-10-CM | POA: Insufficient documentation

## 2015-02-17 DIAGNOSIS — I10 Essential (primary) hypertension: Secondary | ICD-10-CM | POA: Diagnosis not present

## 2015-02-17 DIAGNOSIS — N200 Calculus of kidney: Secondary | ICD-10-CM | POA: Insufficient documentation

## 2015-02-17 SURGERY — LITHOTRIPSY, ESWL
Anesthesia: LOCAL | Laterality: Left

## 2015-02-17 MED ORDER — DIPHENHYDRAMINE HCL 25 MG PO CAPS
25.0000 mg | ORAL_CAPSULE | ORAL | Status: AC
  Administered 2015-02-17: 25 mg via ORAL
  Filled 2015-02-17: qty 1

## 2015-02-17 MED ORDER — DIAZEPAM 5 MG PO TABS
10.0000 mg | ORAL_TABLET | ORAL | Status: AC
  Administered 2015-02-17: 10 mg via ORAL
  Filled 2015-02-17: qty 2

## 2015-02-17 MED ORDER — SODIUM CHLORIDE 0.9 % IV SOLN
INTRAVENOUS | Status: DC
Start: 2015-02-17 — End: 2015-02-17
  Administered 2015-02-17: 09:00:00 via INTRAVENOUS

## 2015-02-17 MED ORDER — CEFAZOLIN SODIUM-DEXTROSE 2-3 GM-% IV SOLR
2.0000 g | INTRAVENOUS | Status: AC
  Administered 2015-02-17: 2 g via INTRAVENOUS
  Filled 2015-02-17: qty 50

## 2015-02-25 DIAGNOSIS — N201 Calculus of ureter: Secondary | ICD-10-CM | POA: Diagnosis not present

## 2015-02-25 DIAGNOSIS — N210 Calculus in bladder: Secondary | ICD-10-CM | POA: Diagnosis not present

## 2015-03-04 ENCOUNTER — Ambulatory Visit (INDEPENDENT_AMBULATORY_CARE_PROVIDER_SITE_OTHER): Payer: Medicare PPO | Admitting: Internal Medicine

## 2015-03-04 ENCOUNTER — Encounter: Payer: Self-pay | Admitting: Internal Medicine

## 2015-03-04 VITALS — BP 136/84 | HR 100 | Temp 98.2°F | Resp 20 | Ht 67.0 in | Wt 179.0 lb

## 2015-03-04 DIAGNOSIS — M545 Low back pain, unspecified: Secondary | ICD-10-CM

## 2015-03-04 DIAGNOSIS — R030 Elevated blood-pressure reading, without diagnosis of hypertension: Secondary | ICD-10-CM | POA: Diagnosis not present

## 2015-03-04 DIAGNOSIS — R972 Elevated prostate specific antigen [PSA]: Secondary | ICD-10-CM | POA: Diagnosis not present

## 2015-03-04 DIAGNOSIS — B029 Zoster without complications: Secondary | ICD-10-CM

## 2015-03-04 DIAGNOSIS — R635 Abnormal weight gain: Secondary | ICD-10-CM

## 2015-03-04 DIAGNOSIS — N390 Urinary tract infection, site not specified: Secondary | ICD-10-CM

## 2015-03-04 MED ORDER — CYCLOBENZAPRINE HCL 10 MG PO TABS: 10.0000 mg | ORAL_TABLET | Freq: Three times a day (TID) | ORAL | Status: DC | PRN

## 2015-03-04 MED ORDER — HYDROCODONE-ACETAMINOPHEN 10-325 MG PO TABS: 1.0000 | ORAL_TABLET | Freq: Four times a day (QID) | ORAL | Status: DC | PRN

## 2015-03-04 MED ORDER — LEVOFLOXACIN 500 MG PO TABS: 500.0000 mg | ORAL_TABLET | Freq: Every day | ORAL | Status: DC

## 2015-03-04 MED ORDER — ACYCLOVIR 800 MG PO TABS: 800.0000 mg | ORAL_TABLET | Freq: Every day | ORAL | Status: DC

## 2015-03-04 NOTE — Assessment & Plan Note (Signed)
12/16 L lower face Acyclovir x 7 d

## 2015-03-04 NOTE — Patient Instructions (Signed)

## 2015-03-04 NOTE — Progress Notes (Signed)
Subjective:  Patient ID: Gordon Lucas, male    DOB: May 31, 1950  Age: 64 y.o. MRN: [id number]  CC: No chief complaint on file.   HPI Gordon Lucas presents for fever 101 two days after surgery (12/11). C/o  rash on L face today. C/o some cough...  Outpatient Prescriptions Prior to Visit  Medication Sig Dispense Refill  . amLODipine (NORVASC) 5 MG tablet Take 1 tablet (5 mg total) by mouth daily. 90 tablet 3  . Cholecalciferol (EQL VITAMIN D3) 1000 UNITS tablet Take 1,000 Units by mouth daily.      . Methen-Hyosc-Meth Blue-Na Phos (UROGESIC-BLUE) 81.6 MG TABS Take 1 tablet 2-4 x/day as needed for urinary spasm or  burning 60 tablet 3  . polyethylene glycol powder (GLYCOLAX/MIRALAX) powder MIX 17 GRAMS IN LIQUID AND DRINK DAILY AS NEEDED (Patient taking differently: Take 17 g by mouth daily as needed (constipation). MIX 17 GRAMS IN LIQUID AND DRINK DAILY AS NEEDED) 527 g 3  . tamsulosin (FLOMAX) 0.4 MG CAPS capsule Take 1 capsule (0.4 mg total) by mouth daily. 90 capsule 3  . cyclobenzaprine (FLEXERIL) 10 MG tablet Take 1 tablet (10 mg total) by mouth 3 (three) times daily as needed. (Patient taking differently: Take 10 mg by mouth 3 (three) times daily as needed for muscle spasms. ) 90 tablet 1  . HYDROcodone-acetaminophen (NORCO) 10-325 MG tablet Take 1 tablet by mouth every 6 (six) hours as needed for severe pain. Please fill on or after 03/17/15 120 tablet 0  . oxyCODONE-acetaminophen (ROXICET) 5-325 MG tablet Take 1 tablet by mouth every 4 (four) hours as needed for severe pain. 30 tablet 0  . trimethoprim (TRIMPEX) 100 MG tablet Take 1 tablet (100 mg total) by mouth daily. 30 tablet 1   No facility-administered medications prior to visit.    ROS Review of Systems  Constitutional: Positive for fatigue. Negative for appetite change and unexpected weight change.  HENT: Negative for congestion, nosebleeds, sneezing, sore throat and trouble swallowing.   Eyes: Negative for  itching and visual disturbance.  Respiratory: Positive for cough.   Cardiovascular: Negative for chest pain, palpitations and leg swelling.  Gastrointestinal: Negative for nausea, diarrhea, blood in stool and abdominal distention.  Genitourinary: Negative for frequency and hematuria.  Musculoskeletal: Negative for back pain, joint swelling, gait problem and neck pain.  Skin: Positive for rash.  Neurological: Negative for dizziness, tremors, speech difficulty and weakness.  Psychiatric/Behavioral: Negative for sleep disturbance, dysphoric mood and agitation. The patient is not nervous/anxious.     Objective:  BP 136/84 mmHg  Pulse 100  Temp(Src) 98.2 F (36.8 C) (Oral)  Resp 20  Ht 5\' 7"  (1.702 m)  Wt 179 lb (81.194 kg)  BMI 28.03 kg/m2  SpO2 97%  BP Readings from Last 3 Encounters:  03/04/15 136/84  02/17/15 154/84  02/11/15 151/83    Wt Readings from Last 3 Encounters:  03/04/15 179 lb (81.194 kg)  02/17/15 183 lb 4 oz (83.122 kg)  02/11/15 189 lb 6 oz (85.9 kg)    Physical Exam  Constitutional: He is oriented to person, place, and time. He appears well-developed. No distress.  NAD  HENT:  Mouth/Throat: Oropharynx is clear and moist.  Eyes: Conjunctivae are normal. Pupils are equal, round, and reactive to light.  Neck: Normal range of motion. No JVD present. No thyromegaly present.  Cardiovascular: Normal rate, regular rhythm, normal heart sounds and intact distal pulses.  Exam reveals no gallop and no friction rub.   No  murmur heard. Pulmonary/Chest: Effort normal and breath sounds normal. No respiratory distress. He has no wheezes. He has no rales. He exhibits no tenderness.  Abdominal: Soft. Bowel sounds are normal. He exhibits no distension and no mass. There is no tenderness. There is no rebound and no guarding.  Musculoskeletal: Normal range of motion. He exhibits no edema or tenderness.  Lymphadenopathy:    He has no cervical adenopathy.  Neurological: He is  alert and oriented to person, place, and time. He has normal reflexes. No cranial nerve deficit. He exhibits normal muscle tone. He displays a negative Romberg sign. Coordination and gait normal.  Skin: Skin is warm and dry. Rash noted.  Psychiatric: He has a normal mood and affect. His behavior is normal. Judgment and thought content normal.  blisters on L upper and lower lips  Lab Results  Component Value Date   WBC 6.7 02/08/2015   HGB 14.7 02/08/2015   HCT 44.7 02/08/2015   PLT 269 02/08/2015   GLUCOSE 91 02/08/2015   CHOL 182 10/04/2014   TRIG 116.0 10/04/2014   HDL 28.00* 10/04/2014   LDLDIRECT 152.8 09/15/2012   LDLCALC 131* 10/04/2014   ALT 26 02/08/2015   AST 21 02/08/2015   NA 141 02/08/2015   K 3.8 02/08/2015   CL 104 02/08/2015   CREATININE 0.98 02/08/2015   BUN 10 02/08/2015   CO2 28 02/08/2015   TSH 1.26 10/04/2014   PSA 1.19 10/04/2014   HGBA1C 4.9 10/04/2014    Dg Chest 2 View  02/08/2015  CLINICAL DATA:  Renal stone removal. EXAM: CHEST  2 VIEW COMPARISON:  No prior exams available for comparison . FINDINGS: Mediastinum and hilar structures are normal. Left base subsegmental atelectasis and or infiltrate. No pleural effusion or pneumothorax. No acute bony abnormality. IMPRESSION: Left base subsegmental atelectasis and or infiltrate . Electronically Signed   By: Maisie Fus  Register   On: 02/08/2015 16:01    Assessment & Plan:   Diagnoses and all orders for this visit:  Low back pain without sciatica, unspecified back pain laterality -     cyclobenzaprine (FLEXERIL) 10 MG tablet; Take 1 tablet (10 mg total) by mouth 3 (three) times daily as needed.  WEIGHT GAIN -     cyclobenzaprine (FLEXERIL) 10 MG tablet; Take 1 tablet (10 mg total) by mouth 3 (three) times daily as needed. -     HYDROcodone-acetaminophen (NORCO) 10-325 MG tablet; Take 1 tablet by mouth every 6 (six) hours as needed for severe pain. Please fill on or after 04/17/15  ELEVATED BP -      cyclobenzaprine (FLEXERIL) 10 MG tablet; Take 1 tablet (10 mg total) by mouth 3 (three) times daily as needed. -     HYDROcodone-acetaminophen (NORCO) 10-325 MG tablet; Take 1 tablet by mouth every 6 (six) hours as needed for severe pain. Please fill on or after 04/17/15  Elevated PSA -     cyclobenzaprine (FLEXERIL) 10 MG tablet; Take 1 tablet (10 mg total) by mouth 3 (three) times daily as needed. -     HYDROcodone-acetaminophen (NORCO) 10-325 MG tablet; Take 1 tablet by mouth every 6 (six) hours as needed for severe pain. Please fill on or after 04/17/15  Bilateral low back pain without sciatica -     HYDROcodone-acetaminophen (NORCO) 10-325 MG tablet; Take 1 tablet by mouth every 6 (six) hours as needed for severe pain. Please fill on or after 04/17/15  Urinary tract infection without hematuria, site unspecified -  Urinalysis; Future  Herpes zoster  Other orders -     levofloxacin (LEVAQUIN) 500 MG tablet; Take 1 tablet (500 mg total) by mouth daily. -     acyclovir (ZOVIRAX) 800 MG tablet; Take 1 tablet (800 mg total) by mouth 5 (five) times daily.   I have discontinued Mr. Schmieder trimethoprim and oxyCODONE-acetaminophen. I have also changed his HYDROcodone-acetaminophen. Additionally, I am having him start on levofloxacin and acyclovir. Lastly, I am having him maintain his Cholecalciferol, tamsulosin, amLODipine, polyethylene glycol powder, UROGESIC-BLUE, and cyclobenzaprine.  Meds ordered this encounter  Medications  . cyclobenzaprine (FLEXERIL) 10 MG tablet    Sig: Take 1 tablet (10 mg total) by mouth 3 (three) times daily as needed.    Dispense:  90 tablet    Refill:  1  . HYDROcodone-acetaminophen (NORCO) 10-325 MG tablet    Sig: Take 1 tablet by mouth every 6 (six) hours as needed for severe pain. Please fill on or after 04/17/15    Dispense:  120 tablet    Refill:  0  . levofloxacin (LEVAQUIN) 500 MG tablet    Sig: Take 1 tablet (500 mg total) by mouth daily.    Dispense:   10 tablet    Refill:  0  . acyclovir (ZOVIRAX) 800 MG tablet    Sig: Take 1 tablet (800 mg total) by mouth 5 (five) times daily.    Dispense:  35 tablet    Refill:  0     Follow-up: Return in about 2 months (around 05/05/2015) for a follow-up visit.  Sonda Primes, MD

## 2015-03-04 NOTE — Progress Notes (Signed)
Pre visit review using our clinic review tool, if applicable. No additional management support is needed unless otherwise documented below in the visit note. 

## 2015-03-04 NOTE — Assessment & Plan Note (Signed)
UA Levaquin 

## 2015-03-17 ENCOUNTER — Ambulatory Visit: Payer: Medicare PPO | Admitting: Internal Medicine

## 2015-04-18 ENCOUNTER — Encounter: Payer: Self-pay | Admitting: Internal Medicine

## 2015-04-18 ENCOUNTER — Ambulatory Visit (INDEPENDENT_AMBULATORY_CARE_PROVIDER_SITE_OTHER): Payer: Medicare Other | Admitting: Internal Medicine

## 2015-04-18 VITALS — BP 110/72 | HR 100 | Wt 175.0 lb

## 2015-04-18 DIAGNOSIS — R030 Elevated blood-pressure reading, without diagnosis of hypertension: Secondary | ICD-10-CM | POA: Diagnosis not present

## 2015-04-18 DIAGNOSIS — R635 Abnormal weight gain: Secondary | ICD-10-CM | POA: Diagnosis not present

## 2015-04-18 DIAGNOSIS — R972 Elevated prostate specific antigen [PSA]: Secondary | ICD-10-CM | POA: Diagnosis not present

## 2015-04-18 DIAGNOSIS — M545 Low back pain, unspecified: Secondary | ICD-10-CM

## 2015-04-18 DIAGNOSIS — J449 Chronic obstructive pulmonary disease, unspecified: Secondary | ICD-10-CM

## 2015-04-18 DIAGNOSIS — J209 Acute bronchitis, unspecified: Secondary | ICD-10-CM

## 2015-04-18 DIAGNOSIS — N200 Calculus of kidney: Secondary | ICD-10-CM

## 2015-04-18 MED ORDER — HYDROCODONE-ACETAMINOPHEN 10-325 MG PO TABS: 1.0000 | ORAL_TABLET | Freq: Four times a day (QID) | ORAL | Status: DC | PRN

## 2015-04-18 MED ORDER — AMOXICILLIN-POT CLAVULANATE 875-125 MG PO TABS: 1.0000 | ORAL_TABLET | Freq: Two times a day (BID) | ORAL | Status: DC

## 2015-04-18 MED ORDER — CYCLOBENZAPRINE HCL 10 MG PO TABS: 10.0000 mg | ORAL_TABLET | Freq: Three times a day (TID) | ORAL | Status: DC | PRN

## 2015-04-18 NOTE — Progress Notes (Signed)
Subjective:  Patient ID: Gordon Lucas, male    DOB: 10-Jul-1950  Age: 65 y.o. MRN: [id number]  CC: No chief complaint on file.   HPI Hanz W Corbello presents for LBP, HTN, Vit D def f/u. C/o cough x 1-2 wks. Pt lost wt working out...  Outpatient Prescriptions Prior to Visit  Medication Sig Dispense Refill  . acyclovir (ZOVIRAX) 800 MG tablet Take 1 tablet (800 mg total) by mouth 5 (five) times daily. 35 tablet 0  . amLODipine (NORVASC) 5 MG tablet Take 1 tablet (5 mg total) by mouth daily. 90 tablet 3  . Cholecalciferol (EQL VITAMIN D3) 1000 UNITS tablet Take 1,000 Units by mouth daily.      . Methen-Hyosc-Meth Blue-Na Phos (UROGESIC-BLUE) 81.6 MG TABS Take 1 tablet 2-4 x/day as needed for urinary spasm or  burning 60 tablet 3  . polyethylene glycol powder (GLYCOLAX/MIRALAX) powder MIX 17 GRAMS IN LIQUID AND DRINK DAILY AS NEEDED (Patient taking differently: Take 17 g by mouth daily as needed (constipation). MIX 17 GRAMS IN LIQUID AND DRINK DAILY AS NEEDED) 527 g 3  . tamsulosin (FLOMAX) 0.4 MG CAPS capsule Take 1 capsule (0.4 mg total) by mouth daily. 90 capsule 3  . cyclobenzaprine (FLEXERIL) 10 MG tablet Take 1 tablet (10 mg total) by mouth 3 (three) times daily as needed. 90 tablet 1  . HYDROcodone-acetaminophen (NORCO) 10-325 MG tablet Take 1 tablet by mouth every 6 (six) hours as needed for severe pain. Please fill on or after 04/17/15 120 tablet 0  . levofloxacin (LEVAQUIN) 500 MG tablet Take 1 tablet (500 mg total) by mouth daily. (Patient not taking: Reported on 04/18/2015) 10 tablet 0   No facility-administered medications prior to visit.    ROS Review of Systems  Constitutional: Negative for appetite change, fatigue and unexpected weight change.  HENT: Negative for congestion, nosebleeds, sneezing, sore throat and trouble swallowing.   Eyes: Negative for itching and visual disturbance.  Respiratory: Positive for cough and wheezing.   Cardiovascular: Negative for  chest pain, palpitations and leg swelling.  Gastrointestinal: Negative for nausea, diarrhea, blood in stool and abdominal distention.  Genitourinary: Negative for frequency and hematuria.  Musculoskeletal: Positive for back pain. Negative for joint swelling, gait problem and neck pain.  Skin: Negative for rash.  Neurological: Negative for dizziness, tremors, speech difficulty and weakness.  Psychiatric/Behavioral: Negative for sleep disturbance, dysphoric mood and agitation. The patient is not nervous/anxious.     Objective:  BP 110/72 mmHg  Pulse 100  Wt 175 lb (79.379 kg)  SpO2 95%  BP Readings from Last 3 Encounters:  04/18/15 110/72  03/04/15 136/84  02/17/15 154/84    Wt Readings from Last 3 Encounters:  04/18/15 175 lb (79.379 kg)  03/04/15 179 lb (81.194 kg)  02/17/15 183 lb 4 oz (83.122 kg)    Physical Exam  Constitutional: He is oriented to person, place, and time. He appears well-developed. No distress.  NAD  HENT:  Mouth/Throat: Oropharynx is clear and moist.  Eyes: Conjunctivae are normal. Pupils are equal, round, and reactive to light.  Neck: Normal range of motion. No JVD present. No thyromegaly present.  Cardiovascular: Normal rate, regular rhythm, normal heart sounds and intact distal pulses.  Exam reveals no gallop and no friction rub.   No murmur heard. Pulmonary/Chest: Effort normal. No respiratory distress. He has wheezes. He has rales. He exhibits no tenderness.  Abdominal: Soft. Bowel sounds are normal. He exhibits no distension and no mass. There is no  tenderness. There is no rebound and no guarding.  Musculoskeletal: Normal range of motion. He exhibits tenderness. He exhibits no edema.  Lymphadenopathy:    He has no cervical adenopathy.  Neurological: He is alert and oriented to person, place, and time. He has normal reflexes. No cranial nerve deficit. He exhibits normal muscle tone. He displays a negative Romberg sign. Coordination and gait normal.    Skin: Skin is warm and dry. No rash noted.  Psychiatric: He has a normal mood and affect. His behavior is normal. Judgment and thought content normal.  R>>L LS tender  Lab Results  Component Value Date   WBC 6.7 02/08/2015   HGB 14.7 02/08/2015   HCT 44.7 02/08/2015   PLT 269 02/08/2015   GLUCOSE 91 02/08/2015   CHOL 182 10/04/2014   TRIG 116.0 10/04/2014   HDL 28.00* 10/04/2014   LDLDIRECT 152.8 09/15/2012   LDLCALC 131* 10/04/2014   ALT 26 02/08/2015   AST 21 02/08/2015   NA 141 02/08/2015   K 3.8 02/08/2015   CL 104 02/08/2015   CREATININE 0.98 02/08/2015   BUN 10 02/08/2015   CO2 28 02/08/2015   TSH 1.26 10/04/2014   PSA 1.19 10/04/2014   HGBA1C 4.9 10/04/2014    Dg Chest 2 View  02/08/2015  CLINICAL DATA:  Renal stone removal. EXAM: CHEST  2 VIEW COMPARISON:  No prior exams available for comparison . FINDINGS: Mediastinum and hilar structures are normal. Left base subsegmental atelectasis and or infiltrate. No pleural effusion or pneumothorax. No acute bony abnormality. IMPRESSION: Left base subsegmental atelectasis and or infiltrate . Electronically Signed   By: Maisie Fus  Register   On: 02/08/2015 16:01    Assessment & Plan:   Diagnoses and all orders for this visit:  Low back pain without sciatica, unspecified back pain laterality -     cyclobenzaprine (FLEXERIL) 10 MG tablet; Take 1 tablet (10 mg total) by mouth 3 (three) times daily as needed.  WEIGHT GAIN -     cyclobenzaprine (FLEXERIL) 10 MG tablet; Take 1 tablet (10 mg total) by mouth 3 (three) times daily as needed. -     Discontinue: HYDROcodone-acetaminophen (NORCO) 10-325 MG tablet; Take 1 tablet by mouth every 6 (six) hours as needed for severe pain. Please fill on or after 05/15/15 -     Discontinue: HYDROcodone-acetaminophen (NORCO) 10-325 MG tablet; Take 1 tablet by mouth every 6 (six) hours as needed for severe pain. Please fill on or after 06/15/15 -     HYDROcodone-acetaminophen (NORCO) 10-325 MG  tablet; Take 1 tablet by mouth every 6 (six) hours as needed for severe pain. Please fill on or after 07/15/15  ELEVATED BP -     cyclobenzaprine (FLEXERIL) 10 MG tablet; Take 1 tablet (10 mg total) by mouth 3 (three) times daily as needed. -     Discontinue: HYDROcodone-acetaminophen (NORCO) 10-325 MG tablet; Take 1 tablet by mouth every 6 (six) hours as needed for severe pain. Please fill on or after 05/15/15 -     Discontinue: HYDROcodone-acetaminophen (NORCO) 10-325 MG tablet; Take 1 tablet by mouth every 6 (six) hours as needed for severe pain. Please fill on or after 06/15/15 -     HYDROcodone-acetaminophen (NORCO) 10-325 MG tablet; Take 1 tablet by mouth every 6 (six) hours as needed for severe pain. Please fill on or after 07/15/15  Elevated PSA -     cyclobenzaprine (FLEXERIL) 10 MG tablet; Take 1 tablet (10 mg total) by mouth 3 (three) times daily  as needed. -     Discontinue: HYDROcodone-acetaminophen (NORCO) 10-325 MG tablet; Take 1 tablet by mouth every 6 (six) hours as needed for severe pain. Please fill on or after 05/15/15 -     Discontinue: HYDROcodone-acetaminophen (NORCO) 10-325 MG tablet; Take 1 tablet by mouth every 6 (six) hours as needed for severe pain. Please fill on or after 06/15/15 -     HYDROcodone-acetaminophen (NORCO) 10-325 MG tablet; Take 1 tablet by mouth every 6 (six) hours as needed for severe pain. Please fill on or after 07/15/15  Bilateral low back pain without sciatica -     Discontinue: HYDROcodone-acetaminophen (NORCO) 10-325 MG tablet; Take 1 tablet by mouth every 6 (six) hours as needed for severe pain. Please fill on or after 05/15/15 -     Discontinue: HYDROcodone-acetaminophen (NORCO) 10-325 MG tablet; Take 1 tablet by mouth every 6 (six) hours as needed for severe pain. Please fill on or after 06/15/15 -     HYDROcodone-acetaminophen (NORCO) 10-325 MG tablet; Take 1 tablet by mouth every 6 (six) hours as needed for severe pain. Please fill on or after 07/15/15  Acute  bilateral low back pain without sciatica  Acute bronchitis, unspecified organism  COPD mixed type (HCC)  Nephrolithiasis  Other orders -     amoxicillin-clavulanate (AUGMENTIN) 875-125 MG tablet; Take 1 tablet by mouth 2 (two) times daily.   I have discontinued Mr. Kissler HYDROcodone-acetaminophen, levofloxacin, and HYDROcodone-acetaminophen. I have also changed his HYDROcodone-acetaminophen. Additionally, I am having him start on amoxicillin-clavulanate. Lastly, I am having him maintain his Cholecalciferol, tamsulosin, amLODipine, polyethylene glycol powder, UROGESIC-BLUE, acyclovir, and cyclobenzaprine.  Meds ordered this encounter  Medications  . cyclobenzaprine (FLEXERIL) 10 MG tablet    Sig: Take 1 tablet (10 mg total) by mouth 3 (three) times daily as needed.    Dispense:  90 tablet    Refill:  1  . DISCONTD: HYDROcodone-acetaminophen (NORCO) 10-325 MG tablet    Sig: Take 1 tablet by mouth every 6 (six) hours as needed for severe pain. Please fill on or after 05/15/15    Dispense:  120 tablet    Refill:  0  . DISCONTD: HYDROcodone-acetaminophen (NORCO) 10-325 MG tablet    Sig: Take 1 tablet by mouth every 6 (six) hours as needed for severe pain. Please fill on or after 06/15/15    Dispense:  120 tablet    Refill:  0  . HYDROcodone-acetaminophen (NORCO) 10-325 MG tablet    Sig: Take 1 tablet by mouth every 6 (six) hours as needed for severe pain. Please fill on or after 07/15/15    Dispense:  120 tablet    Refill:  0  . amoxicillin-clavulanate (AUGMENTIN) 875-125 MG tablet    Sig: Take 1 tablet by mouth 2 (two) times daily.    Dispense:  20 tablet    Refill:  0     Follow-up: Return in about 3 months (around 07/16/2015) for a follow-up visit.  Sonda Primes, MD

## 2015-04-18 NOTE — Assessment & Plan Note (Signed)
R>L Augmentin x 10 d CXR if not better

## 2015-04-18 NOTE — Assessment & Plan Note (Signed)
Chronic OA Norco prn Potential benefits of a long term opioids use as well as potential risks (i.e. addiction risk, apnea etc) and complications (i.e. Somnolence, constipation and others) were explained to the patient and were aknowledged. 

## 2015-04-18 NOTE — Assessment & Plan Note (Signed)
Per Urology  

## 2015-04-18 NOTE — Assessment & Plan Note (Signed)
Smoking discussed 

## 2015-04-18 NOTE — Assessment & Plan Note (Signed)
Doing well 

## 2015-04-18 NOTE — Progress Notes (Signed)
Pre visit review using our clinic review tool, if applicable. No additional management support is needed unless otherwise documented below in the visit note. 

## 2015-07-13 ENCOUNTER — Ambulatory Visit (INDEPENDENT_AMBULATORY_CARE_PROVIDER_SITE_OTHER): Payer: Medicare Other | Admitting: Internal Medicine

## 2015-07-13 ENCOUNTER — Encounter: Payer: Self-pay | Admitting: Internal Medicine

## 2015-07-13 VITALS — BP 130/80 | HR 101 | Wt 172.0 lb

## 2015-07-13 DIAGNOSIS — M545 Low back pain, unspecified: Secondary | ICD-10-CM

## 2015-07-13 DIAGNOSIS — R972 Elevated prostate specific antigen [PSA]: Secondary | ICD-10-CM

## 2015-07-13 DIAGNOSIS — R030 Elevated blood-pressure reading, without diagnosis of hypertension: Secondary | ICD-10-CM | POA: Diagnosis not present

## 2015-07-13 DIAGNOSIS — M5440 Lumbago with sciatica, unspecified side: Secondary | ICD-10-CM

## 2015-07-13 DIAGNOSIS — R635 Abnormal weight gain: Secondary | ICD-10-CM | POA: Diagnosis not present

## 2015-07-13 DIAGNOSIS — R634 Abnormal weight loss: Secondary | ICD-10-CM | POA: Insufficient documentation

## 2015-07-13 MED ORDER — AMLODIPINE BESYLATE 5 MG PO TABS: 5.0000 mg | ORAL_TABLET | Freq: Every day | ORAL | Status: DC

## 2015-07-13 MED ORDER — HYDROCODONE-ACETAMINOPHEN 10-325 MG PO TABS: 1.0000 | ORAL_TABLET | Freq: Four times a day (QID) | ORAL | Status: DC | PRN

## 2015-07-13 MED ORDER — TAMSULOSIN HCL 0.4 MG PO CAPS: 0.4000 mg | ORAL_CAPSULE | Freq: Every day | ORAL | Status: DC

## 2015-07-13 MED ORDER — CYCLOBENZAPRINE HCL 10 MG PO TABS: 10.0000 mg | ORAL_TABLET | Freq: Three times a day (TID) | ORAL | Status: DC | PRN

## 2015-07-13 NOTE — Progress Notes (Signed)
Pre visit review using our clinic review tool, if applicable. No additional management support is needed unless otherwise documented below in the visit note. 

## 2015-07-13 NOTE — Assessment & Plan Note (Signed)
Dr Tannenbaum 

## 2015-07-13 NOTE — Assessment & Plan Note (Signed)
Chronic OA Norco prn Potential benefits of a long term opioids use as well as potential risks (i.e. addiction risk, apnea etc) and complications (i.e. Somnolence, constipation and others) were explained to the patient and were aknowledged. 

## 2015-07-13 NOTE — Progress Notes (Signed)
Subjective:  Patient ID: Gordon Lucas, male    DOB: 06-23-1950  Age: 65 y.o. MRN: [id number]  CC: No chief complaint on file.   HPI Aser W Snelling presents for HTN, LBP, kidney stones f/up. Loosing wt on diet  Outpatient Prescriptions Prior to Visit  Medication Sig Dispense Refill  . acyclovir (ZOVIRAX) 800 MG tablet Take 1 tablet (800 mg total) by mouth 5 (five) times daily. 35 tablet 0  . amLODipine (NORVASC) 5 MG tablet Take 1 tablet (5 mg total) by mouth daily. 90 tablet 3  . Cholecalciferol (EQL VITAMIN D3) 1000 UNITS tablet Take 1,000 Units by mouth daily.      . cyclobenzaprine (FLEXERIL) 10 MG tablet Take 1 tablet (10 mg total) by mouth 3 (three) times daily as needed. 90 tablet 1  . HYDROcodone-acetaminophen (NORCO) 10-325 MG tablet Take 1 tablet by mouth every 6 (six) hours as needed for severe pain. Please fill on or after 07/15/15 120 tablet 0  . polyethylene glycol powder (GLYCOLAX/MIRALAX) powder MIX 17 GRAMS IN LIQUID AND DRINK DAILY AS NEEDED (Patient taking differently: Take 17 g by mouth daily as needed (constipation). MIX 17 GRAMS IN LIQUID AND DRINK DAILY AS NEEDED) 527 g 3  . tamsulosin (FLOMAX) 0.4 MG CAPS capsule Take 1 capsule (0.4 mg total) by mouth daily. 90 capsule 3  . amoxicillin-clavulanate (AUGMENTIN) 875-125 MG tablet Take 1 tablet by mouth 2 (two) times daily. 20 tablet 0  . Methen-Hyosc-Meth Blue-Na Phos (UROGESIC-BLUE) 81.6 MG TABS Take 1 tablet 2-4 x/day as needed for urinary spasm or  burning (Patient not taking: Reported on 07/13/2015) 60 tablet 3   No facility-administered medications prior to visit.    ROS Review of Systems  Constitutional: Positive for unexpected weight change. Negative for appetite change and fatigue.  HENT: Negative for congestion, nosebleeds, sneezing, sore throat and trouble swallowing.   Eyes: Negative for itching and visual disturbance.  Respiratory: Negative for cough.   Cardiovascular: Negative for chest pain,  palpitations and leg swelling.  Gastrointestinal: Negative for nausea, diarrhea, blood in stool and abdominal distention.  Genitourinary: Negative for frequency and hematuria.  Musculoskeletal: Positive for back pain. Negative for joint swelling, gait problem and neck pain.  Skin: Negative for rash.  Neurological: Negative for dizziness, tremors, speech difficulty and weakness.  Psychiatric/Behavioral: Negative for suicidal ideas, sleep disturbance, dysphoric mood and agitation. The patient is not nervous/anxious.     Objective:  BP 150/90 mmHg  Pulse 101  Wt 172 lb (78.019 kg)  SpO2 99%  BP Readings from Last 3 Encounters:  07/13/15 150/90  04/18/15 110/72  03/04/15 136/84    Wt Readings from Last 3 Encounters:  07/13/15 172 lb (78.019 kg)  04/18/15 175 lb (79.379 kg)  03/04/15 179 lb (81.194 kg)    Physical Exam  Constitutional: He is oriented to person, place, and time. He appears well-developed. No distress.  NAD  HENT:  Mouth/Throat: Oropharynx is clear and moist.  Eyes: Conjunctivae are normal. Pupils are equal, round, and reactive to light.  Neck: Normal range of motion. No JVD present. No thyromegaly present.  Cardiovascular: Normal rate, regular rhythm, normal heart sounds and intact distal pulses.  Exam reveals no gallop and no friction rub.   No murmur heard. Pulmonary/Chest: Effort normal and breath sounds normal. No respiratory distress. He has no wheezes. He has no rales. He exhibits no tenderness.  Abdominal: Soft. Bowel sounds are normal. He exhibits no distension and no mass. There is no tenderness.  There is no rebound and no guarding.  Musculoskeletal: Normal range of motion. He exhibits tenderness. He exhibits no edema.  Lymphadenopathy:    He has no cervical adenopathy.  Neurological: He is alert and oriented to person, place, and time. He has normal reflexes. No cranial nerve deficit. He exhibits normal muscle tone. He displays a negative Romberg sign.  Coordination and gait normal.  Skin: Skin is warm and dry. No rash noted.  Psychiatric: He has a normal mood and affect. His behavior is normal. Judgment and thought content normal.   LS tender  Lab Results  Component Value Date   WBC 6.7 02/08/2015   HGB 14.7 02/08/2015   HCT 44.7 02/08/2015   PLT 269 02/08/2015   GLUCOSE 91 02/08/2015   CHOL 182 10/04/2014   TRIG 116.0 10/04/2014   HDL 28.00* 10/04/2014   LDLDIRECT 152.8 09/15/2012   LDLCALC 131* 10/04/2014   ALT 26 02/08/2015   AST 21 02/08/2015   NA 141 02/08/2015   K 3.8 02/08/2015   CL 104 02/08/2015   CREATININE 0.98 02/08/2015   BUN 10 02/08/2015   CO2 28 02/08/2015   TSH 1.26 10/04/2014   PSA 1.19 10/04/2014   HGBA1C 4.9 10/04/2014    Dg Chest 2 View  02/08/2015  CLINICAL DATA:  Renal stone removal. EXAM: CHEST  2 VIEW COMPARISON:  No prior exams available for comparison . FINDINGS: Mediastinum and hilar structures are normal. Left base subsegmental atelectasis and or infiltrate. No pleural effusion or pneumothorax. No acute bony abnormality. IMPRESSION: Left base subsegmental atelectasis and or infiltrate . Electronically Signed   By: Maisie Fushomas  Register   On: 02/08/2015 16:01    Assessment & Plan:   Diagnoses and all orders for this visit:  WEIGHT GAIN -     tamsulosin (FLOMAX) 0.4 MG CAPS capsule; Take 1 capsule (0.4 mg total) by mouth daily. -     cyclobenzaprine (FLEXERIL) 10 MG tablet; Take 1 tablet (10 mg total) by mouth 3 (three) times daily as needed. -     HYDROcodone-acetaminophen (NORCO) 10-325 MG tablet; Take 1 tablet by mouth every 6 (six) hours as needed for severe pain. Please fill on or after 07/15/15  ELEVATED BP -     tamsulosin (FLOMAX) 0.4 MG CAPS capsule; Take 1 capsule (0.4 mg total) by mouth daily. -     cyclobenzaprine (FLEXERIL) 10 MG tablet; Take 1 tablet (10 mg total) by mouth 3 (three) times daily as needed. -     HYDROcodone-acetaminophen (NORCO) 10-325 MG tablet; Take 1 tablet by  mouth every 6 (six) hours as needed for severe pain. Please fill on or after 07/15/15  Elevated PSA -     tamsulosin (FLOMAX) 0.4 MG CAPS capsule; Take 1 capsule (0.4 mg total) by mouth daily. -     cyclobenzaprine (FLEXERIL) 10 MG tablet; Take 1 tablet (10 mg total) by mouth 3 (three) times daily as needed. -     HYDROcodone-acetaminophen (NORCO) 10-325 MG tablet; Take 1 tablet by mouth every 6 (six) hours as needed for severe pain. Please fill on or after 07/15/15  Low back pain without sciatica, unspecified back pain laterality -     cyclobenzaprine (FLEXERIL) 10 MG tablet; Take 1 tablet (10 mg total) by mouth 3 (three) times daily as needed.  Bilateral low back pain without sciatica -     HYDROcodone-acetaminophen (NORCO) 10-325 MG tablet; Take 1 tablet by mouth every 6 (six) hours as needed for severe pain. Please fill on  or after 07/15/15  Other orders -     amLODipine (NORVASC) 5 MG tablet; Take 1 tablet (5 mg total) by mouth daily.   I have discontinued Mr. Burnham amoxicillin-clavulanate. I am also having him maintain his Cholecalciferol, tamsulosin, amLODipine, polyethylene glycol powder, UROGESIC-BLUE, acyclovir, cyclobenzaprine, and HYDROcodone-acetaminophen.  No orders of the defined types were placed in this encounter.     Follow-up: No Follow-up on file.  Sonda Primes, MD

## 2015-07-13 NOTE — Assessment & Plan Note (Signed)
2017 on diet Labs

## 2015-07-15 ENCOUNTER — Telehealth: Payer: Self-pay

## 2015-07-15 NOTE — Telephone Encounter (Signed)
Flexeril DENIED, pt must first try and fail two (2) of the following covered medications; Celebrex, Etodolac, Ketoprofen, Meclofenamate, Nabumetone, and Zanaflex. Please advise on covered alternative, thanks!

## 2015-07-15 NOTE — Telephone Encounter (Signed)
PA initiated via [place]eds [place] [id number]

## 2015-07-17 MED ORDER — CELECOXIB 200 MG PO CAPS: 200.0000 mg | ORAL_CAPSULE | Freq: Two times a day (BID) | ORAL | Status: DC | PRN

## 2015-07-17 NOTE — Telephone Encounter (Signed)
Ok Celebrex 200 mg po bid pc prn. #60 3 ref Thx

## 2015-07-18 NOTE — Telephone Encounter (Signed)
Pt advised and expressed understanding of medication change

## 2015-09-25 ENCOUNTER — Encounter (HOSPITAL_COMMUNITY): Payer: Self-pay | Admitting: Emergency Medicine

## 2015-09-25 ENCOUNTER — Emergency Department (HOSPITAL_COMMUNITY): Payer: Medicare Other

## 2015-09-25 ENCOUNTER — Inpatient Hospital Stay (HOSPITAL_COMMUNITY)
Admission: EM | Admit: 2015-09-25 | Discharge: 2015-09-30 | DRG: 872 | Disposition: A | Discharge status: Home / Self Care | Payer: Medicare Other | Attending: Internal Medicine | Admitting: Internal Medicine

## 2015-09-25 DIAGNOSIS — A419 Sepsis, unspecified organism: Principal | ICD-10-CM | POA: Diagnosis present

## 2015-09-25 DIAGNOSIS — F17210 Nicotine dependence, cigarettes, uncomplicated: Secondary | ICD-10-CM | POA: Diagnosis present

## 2015-09-25 DIAGNOSIS — M545 Low back pain, unspecified: Secondary | ICD-10-CM

## 2015-09-25 DIAGNOSIS — N452 Orchitis: Secondary | ICD-10-CM | POA: Diagnosis present

## 2015-09-25 DIAGNOSIS — B965 Pseudomonas (aeruginosa) (mallei) (pseudomallei) as the cause of diseases classified elsewhere: Secondary | ICD-10-CM | POA: Diagnosis present

## 2015-09-25 DIAGNOSIS — J449 Chronic obstructive pulmonary disease, unspecified: Secondary | ICD-10-CM | POA: Diagnosis present

## 2015-09-25 DIAGNOSIS — R030 Elevated blood-pressure reading, without diagnosis of hypertension: Secondary | ICD-10-CM

## 2015-09-25 DIAGNOSIS — N400 Enlarged prostate without lower urinary tract symptoms: Secondary | ICD-10-CM | POA: Diagnosis present

## 2015-09-25 DIAGNOSIS — E876 Hypokalemia: Secondary | ICD-10-CM | POA: Diagnosis not present

## 2015-09-25 DIAGNOSIS — R609 Edema, unspecified: Secondary | ICD-10-CM

## 2015-09-25 DIAGNOSIS — N179 Acute kidney failure, unspecified: Secondary | ICD-10-CM | POA: Diagnosis present

## 2015-09-25 DIAGNOSIS — R972 Elevated prostate specific antigen [PSA]: Secondary | ICD-10-CM

## 2015-09-25 DIAGNOSIS — E872 Acidosis: Secondary | ICD-10-CM | POA: Diagnosis present

## 2015-09-25 DIAGNOSIS — N390 Urinary tract infection, site not specified: Secondary | ICD-10-CM | POA: Diagnosis present

## 2015-09-25 DIAGNOSIS — R635 Abnormal weight gain: Secondary | ICD-10-CM

## 2015-09-25 HISTORY — DX: Orchitis: N45.2

## 2015-09-25 LAB — CBC WITH DIFFERENTIAL/PLATELET
BASOS ABS: 0 10*3/uL (ref 0.0–0.1)
Basophils Relative: 0 %
EOS ABS: 0 10*3/uL (ref 0.0–0.7)
Eosinophils Relative: 0 %
HCT: 45.2 % (ref 39.0–52.0)
HEMOGLOBIN: 15.3 g/dL (ref 13.0–17.0)
LYMPHS PCT: 2 %
Lymphs Abs: 0.8 10*3/uL (ref 0.7–4.0)
MCH: 30.9 pg (ref 26.0–34.0)
MCHC: 33.8 g/dL (ref 30.0–36.0)
MCV: 91.3 fL (ref 78.0–100.0)
MONO ABS: 3.2 10*3/uL — AB (ref 0.1–1.0)
Monocytes Relative: 8 %
NEUTROS PCT: 90 %
Neutro Abs: 35.9 10*3/uL — ABNORMAL HIGH (ref 1.7–7.7)
PLATELETS: 196 10*3/uL (ref 150–400)
RBC: 4.95 MIL/uL (ref 4.22–5.81)
RDW: 13.3 % (ref 11.5–15.5)
WBC MORPHOLOGY: INCREASED
WBC: 39.9 10*3/uL — AB (ref 4.0–10.5)

## 2015-09-25 LAB — I-STAT CG4 LACTIC ACID, ED
LACTIC ACID, VENOUS: 4.24 mmol/L — AB (ref 0.5–1.9)
Lactic Acid, Venous: 3.75 mmol/L (ref 0.5–1.9)

## 2015-09-25 LAB — COMPREHENSIVE METABOLIC PANEL
ALBUMIN: 3.7 g/dL (ref 3.5–5.0)
ALK PHOS: 46 U/L (ref 38–126)
ALT: 15 U/L — ABNORMAL LOW (ref 17–63)
ANION GAP: 11 (ref 5–15)
AST: 21 U/L (ref 15–41)
BUN: 10 mg/dL (ref 6–20)
CHLORIDE: 106 mmol/L (ref 101–111)
CO2: 18 mmol/L — AB (ref 22–32)
Calcium: 9.3 mg/dL (ref 8.9–10.3)
Creatinine, Ser: 0.95 mg/dL (ref 0.61–1.24)
GFR calc Af Amer: >60 mL/min (ref >60)
GFR calc non Af Amer: >60 mL/min (ref >60)
GLUCOSE: 138 mg/dL — AB (ref 65–99)
POTASSIUM: 3.2 mmol/L — AB (ref 3.5–5.1)
SODIUM: 135 mmol/L (ref 135–145)
Total Bilirubin: 1.9 mg/dL — ABNORMAL HIGH (ref 0.3–1.2)
Total Protein: 9.1 g/dL — ABNORMAL HIGH (ref 6.5–8.1)

## 2015-09-25 LAB — I-STAT BETA HCG BLOOD, ED (MC, WL, AP ONLY): I-stat hCG, quantitative: <5 m[IU]/mL (ref <5)

## 2015-09-25 LAB — URINALYSIS, ROUTINE W REFLEX MICROSCOPIC
BILIRUBIN URINE: NEGATIVE
Glucose, UA: NEGATIVE mg/dL
Ketones, ur: NEGATIVE mg/dL
NITRITE: POSITIVE — AB
PROTEIN: NEGATIVE mg/dL
SPECIFIC GRAVITY, URINE: 1.021 (ref 1.005–1.030)
pH: 6 (ref 5.0–8.0)

## 2015-09-25 LAB — URINE MICROSCOPIC-ADD ON

## 2015-09-25 MED ORDER — LEVOFLOXACIN IN D5W 500 MG/100ML IV SOLN
500.0000 mg | Freq: Every day | INTRAVENOUS | Status: DC
  Administered 2015-09-25 – 2015-09-29 (×5): 500 mg via INTRAVENOUS
  Filled 2015-09-25 (×6): qty 100

## 2015-09-25 MED ORDER — DEXTROSE 5 % IV SOLN
1.0000 g | Freq: Once | INTRAVENOUS | Status: AC
  Administered 2015-09-25: 1 g via INTRAVENOUS
  Filled 2015-09-25: qty 10

## 2015-09-25 MED ORDER — TAMSULOSIN HCL 0.4 MG PO CAPS
0.4000 mg | ORAL_CAPSULE | Freq: Every day | ORAL | Status: DC
  Administered 2015-09-26 – 2015-09-30 (×5): 0.4 mg via ORAL
  Filled 2015-09-25 (×5): qty 1

## 2015-09-25 MED ORDER — POTASSIUM CHLORIDE CRYS ER 20 MEQ PO TBCR
20.0000 meq | EXTENDED_RELEASE_TABLET | Freq: Once | ORAL | Status: AC
  Administered 2015-09-25: 20 meq via ORAL
  Filled 2015-09-25: qty 1

## 2015-09-25 MED ORDER — POTASSIUM CHLORIDE 20 MEQ PO PACK: 20.0000 meq | PACK | Freq: Once | ORAL | Status: DC

## 2015-09-25 MED ORDER — MORPHINE SULFATE (PF) 2 MG/ML IV SOLN: 2.0000 mg | INTRAVENOUS | Status: DC | PRN

## 2015-09-25 MED ORDER — SODIUM CHLORIDE 0.9 % IV SOLN
INTRAVENOUS | Status: DC
  Administered 2015-09-25 – 2015-09-29 (×6): via INTRAVENOUS

## 2015-09-25 MED ORDER — IBUPROFEN 400 MG PO TABS
400.0000 mg | ORAL_TABLET | Freq: Four times a day (QID) | ORAL | Status: DC | PRN
  Administered 2015-09-26: 400 mg via ORAL
  Filled 2015-09-25: qty 1

## 2015-09-25 MED ORDER — DEXTROSE 5 % IV SOLN
2.0000 g | INTRAVENOUS | Status: DC
  Administered 2015-09-26: 2 g via INTRAVENOUS
  Filled 2015-09-25 (×2): qty 2

## 2015-09-25 MED ORDER — ALBUTEROL SULFATE (2.5 MG/3ML) 0.083% IN NEBU: 2.5000 mg | INHALATION_SOLUTION | RESPIRATORY_TRACT | Status: DC | PRN

## 2015-09-25 MED ORDER — ENOXAPARIN SODIUM 40 MG/0.4ML ~~LOC~~ SOLN
40.0000 mg | Freq: Every day | SUBCUTANEOUS | Status: DC
  Administered 2015-09-26 – 2015-09-29 (×5): 40 mg via SUBCUTANEOUS
  Filled 2015-09-25 (×6): qty 0.4

## 2015-09-25 NOTE — H&P (Signed)
History and Physical  Gordon Lucas ZOX:[id number] DOB: [birth year] DOA: 09/25/2015  PCP:  Sonda Primes, MD   Chief Complaint:  Left testicular pain   History of Present Illness:  Patient is a 65 yo male with history of BPH who came with cc of left testicular pain and swelling that started yesterday along with a fever up to 102 associated with dysuria, dizziness and nausea. No recent UTI, no trauma, no penile discharge, no vomiting, no hematuria. He also has mild lower abdominal pain.   Review of Systems:  CONSTITUTIONAL:     No night sweats.  No fatigue.  +fever. No chills. Eyes:                            No visual changes.  No eye pain.  No eye discharge.   ENT:                              No epistaxis.  No sinus pain.  No sore throat.   No congestion. RESPIRATORY:           +cough.  No wheeze.  No hemoptysis.  No dyspnea CARDIOVASCULAR   :  No chest pains.  No palpitations. GASTROINTESTINAL:  No abdominal pain.  +nausea. No vomiting.  No diarrhea. No constipation.  No hematemesis.  No hematochezia.  No melena. GENITOURINARY:      No urgency.  No frequency.  +dysuria.  No hematuria.  No obstructive symptoms.  No discharge.  +pain.   MUSCULOSKELETAL:  No musculoskeletal pain.  No joint swelling.  No arthritis. NEUROLOGICAL:        No confusion.  No weakness. No headache. No seizure. PSYCHIATRIC:             No depression. No anxiety. No suicidal ideation. SKIN:                             No rashes.  No lesions.  No wounds. ENDOCRINE:                No weight loss.  No polydipsia.  No polyuria.  No polyphagia. HEMATOLOGIC:           No purpura.  No petechiae.  No bleeding.  ALLERGIC                 : No pruritus.  No angioedema Other:  Past Medical and Surgical History:   Past Medical History  Diagnosis Date  . Low back pain   . BPH (benign prostatic hypertrophy)   . COPD (chronic obstructive pulmonary disease) (HCC)   . ED (erectile dysfunction)   .  Osteoporosis   . Microscopic hematuria     s/p Urol w/up  . Asthma   . Arthritis   . Imbalance   . Kidney stones   . Urinary hesitancy    Past Surgical History  Procedure Laterality Date  . Cystoscopy with retrograde pyelogram, ureteroscopy and stent placement  02/11/2015  . Transurethral resection of bladder tumor Left 02/11/2015    Procedure: RIGID AND FLEXIBLE CYSTOSCOPY WITH LEFT RETROGRADE PYELOGRAM, SEMI RIGID URETEROSCOPY WITH LEFT RETROGRADE, LASER TREATMENT OF URETERAL STONE WITH STENT PLACEMENT;  Surgeon: Jethro Bolus, MD;  Location: [facility];  Service: Urology;  Laterality: Left;  . Holmium laser application N/A 02/11/2015    Procedure: HOLMIUM LASER  APPLICATION;  Surgeon: Jethro BolusSigmund Tannenbaum, MD;  [facility];  Service: Urology;  Laterality: N/A;    Social History:   reports that he has been smoking Cigarettes.  He has a 47 pack-year smoking history. He has never used smokeless tobacco. He reports that he drinks about 8.4 oz of alcohol per week. He reports that he does not use illicit drugs.    No Known Allergies  Family History  Problem Relation Age of Onset  . Hypertension Other       Prior to Admission medications   Medication Sig Start Date End Date Taking? Authorizing Provider  acetaminophen (TYLENOL) 500 MG tablet Take 1,500 mg by mouth every 6 (six) hours as needed (pain).   Yes Historical Provider, MD  celecoxib (CELEBREX) 200 MG capsule Take 1 capsule (200 mg total) by mouth 2 (two) times daily as needed. Patient taking differently: Take 200 mg by mouth 4 (four) times daily as needed (pain).  07/17/15  Yes Tresa GarterAleksei V Plotnikov, MD  HYDROcodone-acetaminophen (NORCO) 10-325 MG tablet Take 1 tablet by mouth every 6 (six) hours as needed for severe pain. Please fill on or after 10/15/15 07/13/15  Yes Georgina QuintAleksei V Plotnikov, MD  polyethylene glycol powder (GLYCOLAX/MIRALAX) powder MIX 17 GRAMS IN LIQUID AND DRINK DAILY AS  NEEDED Patient taking differently: Take 17 g by mouth daily as needed (constipation). Mix in 8 oz liquid and drink 01/04/15  Yes Georgina QuintAleksei V Plotnikov, MD  tamsulosin (FLOMAX) 0.4 MG CAPS capsule Take 1 capsule (0.4 mg total) by mouth daily. 07/13/15  Yes Georgina QuintAleksei V Plotnikov, MD  amLODipine (NORVASC) 5 MG tablet Take 1 tablet (5 mg total) by mouth daily. Patient not taking: Reported on 09/25/2015 07/13/15   Tresa GarterAleksei V Plotnikov, MD  cyclobenzaprine (FLEXERIL) 10 MG tablet Take 1 tablet (10 mg total) by mouth 3 (three) times daily as needed. Patient not taking: Reported on 09/25/2015 07/13/15   Tresa GarterAleksei V Plotnikov, MD  Methen-Hyosc-Meth Blue-Na Phos (UROGESIC-BLUE) 81.6 MG TABS Take 1 tablet 2-4 x/day as needed for urinary spasm or  burning Patient not taking: Reported on 07/13/2015 02/11/15   Jethro BolusSigmund Tannenbaum, MD    Physical Exam: BP 104/86 mmHg  Pulse 101  Temp(Src) 101.6 F (38.7 C) (Rectal)  SpO2 98%  GENERAL :   Alert and cooperative, and appears to be in no acute distress. HEAD:           normocephalic. EYES:            PERRL, EOMI.  vision is grossly intact. EARS:           hearing grossly intact. NOSE:           No nasal discharge. THROAT:     Oral cavity and pharynx normal.   NECK:          supple, non-tender.  CARDIAC:    Normal S1 and S2. No gallop. No murmurs.  Vascular:     no peripheral edema. LUNGS:       Clear to auscultation  ABDOMEN: Positive bowel sounds. Soft, nondistended, suprapubic abd pain to palpation. No guarding or rebound.  Scrotum swollen and tender.  MSK:           No joint erythema or tenderness.  EXT           : No significant deformity or joint abnormality. Neuro        : Alert, oriented to person, place, and time.     SKIN:  No rash. No lesions. PSYCH:       No hallucination. Patient is not suicidal.          Labs on Admission:  Reviewed.   Radiological Exams on Admission: Dg Chest 2 View  09/25/2015  CLINICAL DATA:  Patient with pain and  swelling to the left testicle. Possible sepsis. EXAM: CHEST  2 VIEW COMPARISON:  Chest radiograph 02/08/2015. FINDINGS: Normal cardiac and mediastinal contours. No consolidative pulmonary opacities. No pleural effusion or pneumothorax. Mid thoracic spine degenerative changes. IMPRESSION: No active cardiopulmonary disease. Electronically Signed   By: Annia Belt M.D.   On: 09/25/2015 19:00   US Scrotum  09/25/2015  CLINICAL DATA:  Left testicular pain. EXAM: SCROTAL ULTRASOUND DOPPLER ULTRASOUND OF THE TESTICLES TECHNIQUE: Complete ultrasound examination of the testicles, epididymis, and other scrotal structures was performed. Color and spectral Doppler ultrasound were also utilized to evaluate blood flow to the testicles. COMPARISON:  None. FINDINGS: Right testicle Measurements: 3.0 x 2.1 x 1.9 cm. No mass or microlithiasis visualized. Left testicle Measurements: 4.1 x 3.3 x 3.2 cm. The left testicles enlarged compared to the right with no masses. Right epididymis:  Normal in size and appearance. Left epididymis: Increased blood flow seen in the left epididymis compared to the right with no mass. Hydrocele:  There is a complex hydrocele on the left. Varicocele:  None visualized. Pulsed Doppler interrogation of both testes demonstrates normal blood flow on the right. There is decreased arterial an venous blood flow on the left compared to the right. There is increased blood flow within the left epididymis. IMPRESSION: 1. The left testicle is enlarged with decreased arterial and venous blood flow compared to the right. Partial or intermittent torsion is not excluded based on imaging. There is also increased blood flow in the left epididymis which could be seen in the setting of epididymitis. There is a complex hydrocele on the left. Findings discussed with Dr. Jacquelyne Balint Electronically Signed   By: Gerome Sam III M.D   On: 09/25/2015 21:13   [place] Art/ven Flow Abd Pelv Doppler  09/25/2015  CLINICAL DATA:  Left  testicular pain. EXAM: SCROTAL ULTRASOUND DOPPLER ULTRASOUND OF THE TESTICLES TECHNIQUE: Complete ultrasound examination of the testicles, epididymis, and other scrotal structures was performed. Color and spectral Doppler ultrasound were also utilized to evaluate blood flow to the testicles. COMPARISON:  None. FINDINGS: Right testicle Measurements: 3.0 x 2.1 x 1.9 cm. No mass or microlithiasis visualized. Left testicle Measurements: 4.1 x 3.3 x 3.2 cm. The left testicles enlarged compared to the right with no masses. Right epididymis:  Normal in size and appearance. Left epididymis: Increased blood flow seen in the left epididymis compared to the right with no mass. Hydrocele:  There is a complex hydrocele on the left. Varicocele:  None visualized. Pulsed Doppler interrogation of both testes demonstrates normal blood flow on the right. There is decreased arterial an venous blood flow on the left compared to the right. There is increased blood flow within the left epididymis. IMPRESSION: 1. The left testicle is enlarged with decreased arterial and venous blood flow compared to the right. Partial or intermittent torsion is not excluded based on imaging. There is also increased blood flow in the left epididymis which could be seen in the setting of epididymitis. There is a complex hydrocele on the left. Findings discussed with Dr. Jacquelyne Balint Electronically Signed   By: Gerome Sam III M.D   On: 09/25/2015 21:13      Assessment/Plan  Sepsis due  to acute Orchitis / UTI;  Started on ceft and levo daily IVF Urology following  [place] confirms decreased blood flow to left testicle but no torsion.  Scrotum elevation  Morphine prn pain: severe Ibuprofen : prn pain mild BPH; cont home meds  Asthma: started on alb as needed.   Input & Output: NA Lines & Tubes: PIV DVT prophylaxis: [place] enoxaparin  GI prophylaxis: NA Consultants: urology  Code Status: Full  Family Communication: at bedside  Disposition  Plan: Admit     Eston Esters M.D [facility]

## 2015-09-25 NOTE — ED Provider Notes (Signed)
CSN: [id number]     Arrival date & time 09/25/15  1758 History   First MD Initiated Contact with Patient 09/25/15 1902     Chief Complaint  Patient presents with  . Groin Swelling      HPI  patient presents with fever, chills and swelling and pain to the left testicle suggest today.  Fever as high as 102.3 at home.  Has had some dysuria.  Denies back pain.  Denies nausea vomiting.  Patient has previous history of kidney stones.  Also has a history of benign prostatic hypertrophy.   Past Medical History  Diagnosis Date  . Low back pain   . BPH (benign prostatic hypertrophy)   . COPD (chronic obstructive pulmonary disease) (HCC)   . ED (erectile dysfunction)   . Osteoporosis   . Microscopic hematuria     s/p Urol w/up  . Asthma   . Arthritis   . Imbalance   . Kidney stones   . Urinary hesitancy   . Orchitis, left hospitalized 09/25/2015   Past Surgical History  Procedure Laterality Date  . Cystoscopy with retrograde pyelogram, ureteroscopy and stent placement  02/11/2015  . Transurethral resection of bladder tumor Left 02/11/2015    Procedure: RIGID AND FLEXIBLE CYSTOSCOPY WITH LEFT RETROGRADE PYELOGRAM, SEMI RIGID URETEROSCOPY WITH LEFT RETROGRADE, LASER TREATMENT OF URETERAL STONE WITH STENT PLACEMENT;  Surgeon: Jethro BolusSigmund Tannenbaum, MD;  [facility] [place];  Service: Urology;  Laterality: Left;  . Holmium laser application N/A 02/11/2015    Procedure: HOLMIUM LASER APPLICATION;  Surgeon: Jethro BolusSigmund Tannenbaum, MD;  [facility] [place];  Service: Urology;  Laterality: N/A;   Family History  Problem Relation Age of Onset  . Hypertension Other    Social History  Substance Use Topics  . Smoking status: Current Every Day Smoker -- 1.00 packs/day for 47 years    Types: Cigarettes  . Smokeless tobacco: Never Used  . Alcohol Use: 6.6 oz/week    11 Shots of liquor per week     Comment: 09/29/2015 "pint of liquor/week    Review of Systems  All other  systems reviewed and are negative  Allergies  Review of patient's allergies indicates no known allergies.  Home Medications   Prior to Admission medications   Medication Sig Start Date End Date Taking? Authorizing Provider  acetaminophen (TYLENOL) 500 MG tablet Take 1,500 mg by mouth every 6 (six) hours as needed (pain).   Yes Historical Provider, MD  polyethylene glycol powder (GLYCOLAX/MIRALAX) powder MIX 17 GRAMS IN LIQUID AND DRINK DAILY AS NEEDED Patient taking differently: Take 17 g by mouth daily as needed (constipation). Mix in 8 oz liquid and drink 01/04/15  Yes Georgina QuintAleksei V Plotnikov, MD  tamsulosin (FLOMAX) 0.4 MG CAPS capsule Take 1 capsule (0.4 mg total) by mouth daily. 07/13/15  Yes Tresa GarterAleksei V Plotnikov, MD  HYDROcodone-acetaminophen (NORCO) 10-325 MG tablet Take 1 tablet by mouth every 6 (six) hours as needed for severe pain. Please fill on or after 10/15/15 09/30/15   Kathlen ModyVijaya Akula, MD  levofloxacin (LEVAQUIN) 500 MG tablet Take 1 tablet (500 mg total) by mouth daily. 10/01/15   Kathlen ModyVijaya Akula, MD   BP 127/63 mmHg  Pulse 94  Temp(Src) 98.4 F (36.9 C) (Oral)  Resp 19  Ht 5\' 7"  (1.702 m)  Wt 165 lb 8 oz (75.07 kg)  BMI 25.91 kg/m2  SpO2 94% Physical Exam Physical Exam  Nursing note and vitals reviewed. Constitutional: He is oriented to person, place, and time. He  appears well-developed and well-nourished.  Patient was shivering.Marland Kitchen  HENT:  Head: Normocephalic and atraumatic.  Eyes: Pupils are equal, round, and reactive to light.  Neck: Normal range of motion.  Cardiovascular: Normal rate and intact distal pulses.   Pulmonary/Chest: No respiratory distress.  Abdominal: Normal appearance. He exhibits no distension. Genitourinary: Left scrotum enlarged and testicle enlarged and extremely tender to palpation.   Musculoskeletal: Normal range of motion.  Neurological: He is alert and oriented to person, place, and time. No cranial nerve deficit.  Skin: Skin is warm and dry. No rash  noted.  Psychiatric: He has a normal mood and affect. His behavior is normal.   ED Course  Procedures (including critical care time) Medications  0.9 %  sodium chloride infusion (not administered)  ondansetron (ZOFRAN) injection 4 mg (not administered)  cefTRIAXone (ROCEPHIN) 1 g in dextrose 5 % 50 mL IVPB (1 g Intravenous New Bag/Given 09/25/15 2108)  potassium chloride SA (K-DUR,KLOR-CON) CR tablet 20 mEq (20 mEq Oral Given 09/25/15 2303)  potassium chloride SA (K-DUR,KLOR-CON) CR tablet 40 mEq (40 mEq Oral Given 09/27/15 1710)   CRITICAL CARE Performed by: Nelva Nay L Total critical care time: 30 minutes Critical care time was exclusive of separately billable procedures and treating other patients. Critical care was necessary to treat or prevent imminent or life-threatening deterioration. Critical care was time spent personally by me on the following activities: development of treatment plan with patient and/or surrogate as well as nursing, discussions with consultants, evaluation of patient's response to treatment, examination of patient, obtaining history from patient or surrogate, ordering and performing treatments and interventions, ordering and review of laboratory studies, ordering and review of radiographic studies, pulse oximetry and re-evaluation of patient's condition.  Labs Review Labs Reviewed  CULTURE, BLOOD (ROUTINE X 2) - Abnormal; Notable for the following:    Culture   (*)    Value: STAPHYLOCOCCUS SPECIES (COAGULASE NEGATIVE) THE SIGNIFICANCE OF ISOLATING THIS ORGANISM FROM A SINGLE SET OF BLOOD CULTURES WHEN MULTIPLE SETS ARE DRAWN IS UNCERTAIN. PLEASE NOTIFY THE MICROBIOLOGY DEPARTMENT WITHIN ONE WEEK IF SPECIATION AND SENSITIVITIES ARE REQUIRED.    All other components within normal limits  URINE CULTURE - Abnormal; Notable for the following:    Culture >=100,000 COLONIES/mL PSEUDOMONAS AERUGINOSA (*)    Organism ID, Bacteria PSEUDOMONAS AERUGINOSA (*)    All  other components within normal limits  COMPREHENSIVE METABOLIC PANEL - Abnormal; Notable for the following:    Potassium 3.2 (*)    CO2 18 (*)    Glucose, Bld 138 (*)    Total Protein 9.1 (*)    ALT 15 (*)    Total Bilirubin 1.9 (*)    All other components within normal limits  CBC WITH DIFFERENTIAL/PLATELET - Abnormal; Notable for the following:    WBC 39.9 (*)    Neutro Abs 35.9 (*)    Monocytes Absolute 3.2 (*)    All other components within normal limits  URINALYSIS, ROUTINE W REFLEX MICROSCOPIC (NOT AT [facility]) - Abnormal; Notable for the following:    Color, Urine AMBER (*)    APPearance CLOUDY (*)    Hgb urine dipstick SMALL (*)    Nitrite POSITIVE (*)    Leukocytes, UA MODERATE (*)    All other components within normal limits  URINE MICROSCOPIC-ADD ON - Abnormal; Notable for the following:    Squamous Epithelial / LPF 0-5 (*)    Bacteria, UA MANY (*)    All other components within normal limits  CBC -  Abnormal; Notable for the following:    WBC 41.5 (*)    All other components within normal limits  COMPREHENSIVE METABOLIC PANEL - Abnormal; Notable for the following:    CO2 20 (*)    Glucose, Bld 112 (*)    Creatinine, Ser 1.25 (*)    Calcium 8.1 (*)    Total Protein 5.3 (*)    Albumin 2.6 (*)    ALT 14 (*)    GFR calc non Af Amer 59 (*)    All other components within normal limits  CBC - Abnormal; Notable for the following:    WBC 46.0 (*)    Hemoglobin 12.9 (*)    HCT 38.6 (*)    All other components within normal limits  BASIC METABOLIC PANEL - Abnormal; Notable for the following:    Potassium 3.0 (*)    Calcium 8.4 (*)    All other components within normal limits  BASIC METABOLIC PANEL - Abnormal; Notable for the following:    CO2 21 (*)    Calcium 8.6 (*)    All other components within normal limits  CBC WITH DIFFERENTIAL/PLATELET - Abnormal; Notable for the following:    WBC 44.0 (*)    Hemoglobin 12.7 (*)    HCT 38.0 (*)    Neutro Abs 39.2 (*)     Monocytes Absolute 2.6 (*)    All other components within normal limits  CBC - Abnormal; Notable for the following:    WBC 19.1 (*)    RBC 4.18 (*)    Hemoglobin 12.4 (*)    HCT 36.8 (*)    All other components within normal limits  I-STAT CG4 LACTIC ACID, ED - Abnormal; Notable for the following:    Lactic Acid, Venous 4.24 (*)    All other components within normal limits  I-STAT CG4 LACTIC ACID, ED - Abnormal; Notable for the following:    Lactic Acid, Venous 3.75 (*)    All other components within normal limits  CULTURE, BLOOD (ROUTINE X 2)  BLOOD CULTURE ID PANEL (REFLEXED)  HIV ANTIBODY (ROUTINE TESTING)  LACTIC ACID, PLASMA  I-STAT BETA HCG BLOOD, ED ([facility], [facility], [facility] ONLY)  GC/CHLAMYDIA PROBE AMP ([place]) NOT AT [facility]    Imaging Review No results found. I have personally reviewed and evaluated these images and lab results as part of my medical decision-making.  Code sepsis was called.  Consulted urology who came saw the patient in the emergency room.  Patient was admitted to the hospitalist service.  Patient was given IV Rocephin after blood cultures and IV fluids.  Patient has had no hypotension.  MDM   Final diagnoses:  Orchitis  Sepsis, due to unspecified organism [facility])        Nelva Nay, MD 10/01/15 1030

## 2015-09-25 NOTE — Progress Notes (Signed)
Pharmacy Antibiotic Note  Frederica Kusterrofandro W Zonia KiefStephens is a 65 y.o. male admitted on 09/25/2015 with epididimytis.  Pharmacy has been consulted for rocephin dosing.  65 yo who presented with scrotal infection. He is being r/o for epididimytis. It's being covered for both GNR and atypical with rocephin+levaquin. Rocephin will not require renal adjustments so rx will sign off after the initial dose.   Plan:  Rocephin 2g IV q24 Rx sign off     Temp (24hrs), Avg:101.6 F (38.7 C), Min:101.6 F (38.7 C), Max:101.6 F (38.7 C)   Recent Labs Lab 09/25/15 1812 09/25/15 1848  WBC 39.9*  --   CREATININE 0.95  --   LATICACIDVEN  --  4.24*    CrCl cannot be calculated (Unknown ideal weight.).    No Known Allergies  Antimicrobials this admission: 7/16 levaquin >>  7/16 rocephin >>   Dose adjustments this admission:   Microbiology results: BCx:  UCx:   Sputum:  MRSA PCR:   Ulyses SouthwardMinh Warrene Kapfer, PharmD Pager: [phone] 09/25/2015 9:54 PM

## 2015-09-25 NOTE — Consult Note (Signed)
Urology Consult  Referring physician: Rockwell [place] Reason for referral: Scrotal infection  Chief Complaint: Scrotal infection  History of Present Illness: Patient of Dr Hartley Barefoot; called for scrotal infection; fever and swelling; elevated WBC; pain x 28 hours; developed fever in last few hours; slight increase in frequency; urine may be a bit orange colored; no dysuria; no scrotal surgery; non-diabetic;  Modifying factors: There are no other modifying factors  Associated signs and symptoms: There are no other associated signs and symptoms Aggravating and relieving factors: There are no other aggravating or relieving factors Severity: Moderate Duration: Persistent  Past Medical History  Diagnosis Date  . Low back pain   . BPH (benign prostatic hypertrophy)   . COPD (chronic obstructive pulmonary disease) ([place])   . ED (erectile dysfunction)   . Osteoporosis   . Microscopic hematuria     s/p Urol w/up  . Asthma   . Arthritis   . Imbalance   . Kidney stones   . Urinary hesitancy    Past Surgical History  Procedure Laterality Date  . Cystoscopy with retrograde pyelogram, ureteroscopy and stent placement  02/11/2015  . Transurethral resection of bladder tumor Left 02/11/2015    Procedure: RIGID AND FLEXIBLE CYSTOSCOPY WITH LEFT RETROGRADE PYELOGRAM, SEMI RIGID URETEROSCOPY WITH LEFT RETROGRADE, LASER TREATMENT OF URETERAL STONE WITH STENT PLACEMENT;  Surgeon: Carolan Clines, MD;  Location: [place];  Service: Urology;  Laterality: Left;  . Holmium laser application N/A 02/13/5808    Procedure: HOLMIUM LASER APPLICATION;  Surgeon: Carolan Clines, MD;  Location: [facility];  Service: Urology;  Laterality: N/A;    Medications: I have reviewed the patient's current medications. Allergies: No Known Allergies  Family History  Problem Relation Age of Onset  . Hypertension Other    Social History:  reports that he has been smoking Cigarettes.  He has  a 47 pack-year smoking history. He has never used smokeless tobacco. He reports that he drinks about 8.4 oz of alcohol per week. He reports that he does not use illicit drugs.  ROS: All systems are reviewed and negative except as noted. Rest negative  Physical Exam:  Vital signs in last 24 hours: Temp:  [101.6 F (38.7 C)] 101.6 F (38.7 C) (07/16 1939)  Cardiovascular: Skin warm; not flushed Respiratory: Breaths quiet; no shortness of breath Abdomen: No masses Neurological: Normal sensation to touch Musculoskeletal: Normal motor function arms and legs Lymphatics: No inguinal adenopathy Skin: No rashes Genitourinary:mild to moderate testis swelling; no cellulitis; no torsion; no cord induration or infection; no skin infection  Laboratory Data:  Results for orders placed or performed during the hospital encounter of 09/25/15 (from the past 72 hour(s))  Comprehensive metabolic panel     Status: Abnormal   Collection Time: 09/25/15  6:12 PM  Result Value Ref Range   Sodium 135 135 - 145 mmol/L   Potassium 3.2 (L) 3.5 - 5.1 mmol/L   Chloride 106 101 - 111 mmol/L   CO2 18 (L) 22 - 32 mmol/L   Glucose, Bld 138 (H) 65 - 99 mg/dL   BUN 10 6 - 20 mg/dL   Creatinine, Ser 0.95 0.61 - 1.24 mg/dL   Calcium 9.3 8.9 - 10.3 mg/dL   Total Protein 9.1 (H) 6.5 - 8.1 g/dL   Albumin 3.7 3.5 - 5.0 g/dL   AST 21 15 - 41 U/L   ALT 15 (L) 17 - 63 U/L   Alkaline Phosphatase 46 38 - 126 U/L   Total  Bilirubin 1.9 (H) 0.3 - 1.2 mg/dL   GFR calc non Af Amer >60 >60 mL/min   GFR calc Af Amer >60 >60 mL/min    Comment: (NOTE) The eGFR has been calculated using the CKD EPI equation. This calculation has not been validated in all clinical situations. eGFR's persistently <60 mL/min signify possible Chronic Kidney Disease.    Anion gap 11 5 - 15  CBC with Differential     Status: Abnormal   Collection Time: 09/25/15  6:12 PM  Result Value Ref Range   WBC 39.9 (H) 4.0 - 10.5 K/uL   RBC 4.95 4.22 -  5.81 MIL/uL   Hemoglobin 15.3 13.0 - 17.0 g/dL   HCT 45.2 39.0 - 52.0 %   MCV 91.3 78.0 - 100.0 fL   MCH 30.9 26.0 - 34.0 pg   MCHC 33.8 30.0 - 36.0 g/dL   RDW 13.3 11.5 - 15.5 %   Platelets 196 150 - 400 K/uL    Comment: PLATELET COUNT CONFIRMED BY SMEAR REPEATED TO VERIFY    Neutrophils Relative % 90 %   Lymphocytes Relative 2 %   Monocytes Relative 8 %   Eosinophils Relative 0 %   Basophils Relative 0 %   Neutro Abs 35.9 (H) 1.7 - 7.7 K/uL   Lymphs Abs 0.8 0.7 - 4.0 K/uL   Monocytes Absolute 3.2 (H) 0.1 - 1.0 K/uL   Eosinophils Absolute 0.0 0.0 - 0.7 K/uL   Basophils Absolute 0.0 0.0 - 0.1 K/uL   WBC Morphology INCREASED BANDS (>20% BANDS)   Urinalysis, Routine w reflex microscopic     Status: Abnormal   Collection Time: 09/25/15  6:28 PM  Result Value Ref Range   Color, Urine AMBER (A) YELLOW    Comment: BIOCHEMICALS MAY BE AFFECTED BY COLOR   APPearance CLOUDY (A) CLEAR   Specific Gravity, Urine 1.021 1.005 - 1.030   pH 6.0 5.0 - 8.0   Glucose, UA NEGATIVE NEGATIVE mg/dL   Hgb urine dipstick SMALL (A) NEGATIVE   Bilirubin Urine NEGATIVE NEGATIVE   Ketones, ur NEGATIVE NEGATIVE mg/dL   Protein, ur NEGATIVE NEGATIVE mg/dL   Nitrite POSITIVE (A) NEGATIVE   Leukocytes, UA MODERATE (A) NEGATIVE  Urine microscopic-add on     Status: Abnormal   Collection Time: 09/25/15  6:28 PM  Result Value Ref Range   Squamous Epithelial / LPF 0-5 (A) NONE SEEN   WBC, UA TOO NUMEROUS TO COUNT 0 - 5 WBC/hpf   RBC / HPF 0-5 0 - 5 RBC/hpf   Bacteria, UA MANY (A) NONE SEEN   Sperm, UA PRESENT   I-Stat beta hCG blood, ED     Status: None   Collection Time: 09/25/15  6:46 PM  Result Value Ref Range   I-stat hCG, quantitative <5.0 <5 mIU/mL   Comment 3            Comment:   GEST. AGE      CONC.  (mIU/mL)   <=1 WEEK        5 - 50     2 WEEKS       50 - 500     3 WEEKS       100 - 10,000     4 WEEKS     1,000 - 30,000        MALE AND NON-PREGNANT MALE:     LESS THAN 5 mIU/mL    I-Stat CG4 Lactic Acid, ED     Status: Abnormal  Collection Time: 09/25/15  6:48 PM  Result Value Ref Range   Lactic Acid, Venous 4.24 (HH) 0.5 - 1.9 mmol/L   Comment NOTIFIED PHYSICIAN    No results found for this or any previous visit (from the past 240 hour(s)). Creatinine:  Recent Labs  09/25/15 1812  CREATININE 0.95    Xrays: See report/chart Reviewed with radiology: left testes is swollen with reduced blood flow; no abscess or soft tissue swelling; increased bld flow to left epididymis  Impression/Assessment:  Orchitis; reduced blood flow likely from "closed box" pressure; no evidence of torsion; not in age group for torsion and has been sore for 28 hours; fever and physical exam not in keeping with torsion; if torsion- length of history does not favor surgery and a scrotal exploration would likely end with orchiectomy; delayed surgery and change in presentation each day discussed with patient  Plan:  Iv antibiotics; send urine and blood c/s; towel under scrotum for elevation; can use daily anti-inflammatories; will follow daily; non-surgical tonight  Takera Rayl A 09/25/2015, 8:27 PM

## 2015-09-25 NOTE — ED Notes (Signed)
C/o pain and swelling to L testicle since yesterday.  Also reports fever up to 102.3  at home.

## 2015-09-26 DIAGNOSIS — E872 Acidosis: Secondary | ICD-10-CM

## 2015-09-26 LAB — COMPREHENSIVE METABOLIC PANEL
ALT: 14 U/L — ABNORMAL LOW (ref 17–63)
ANION GAP: 9 (ref 5–15)
AST: 18 U/L (ref 15–41)
Albumin: 2.6 g/dL — ABNORMAL LOW (ref 3.5–5.0)
Alkaline Phosphatase: 38 U/L (ref 38–126)
BILIRUBIN TOTAL: 1.1 mg/dL (ref 0.3–1.2)
BUN: 15 mg/dL (ref 6–20)
CHLORIDE: 109 mmol/L (ref 101–111)
CO2: 20 mmol/L — ABNORMAL LOW (ref 22–32)
Calcium: 8.1 mg/dL — ABNORMAL LOW (ref 8.9–10.3)
Creatinine, Ser: 1.25 mg/dL — ABNORMAL HIGH (ref 0.61–1.24)
GFR, EST NON AFRICAN AMERICAN: 59 mL/min — AB (ref >60)
Glucose, Bld: 112 mg/dL — ABNORMAL HIGH (ref 65–99)
POTASSIUM: 3.7 mmol/L (ref 3.5–5.1)
Sodium: 138 mmol/L (ref 135–145)
TOTAL PROTEIN: 5.3 g/dL — AB (ref 6.5–8.1)

## 2015-09-26 LAB — CBC
HEMATOCRIT: 40 % (ref 39.0–52.0)
Hemoglobin: 13.3 g/dL (ref 13.0–17.0)
MCH: 30.2 pg (ref 26.0–34.0)
MCHC: 33.3 g/dL (ref 30.0–36.0)
MCV: 90.9 fL (ref 78.0–100.0)
PLATELETS: 158 10*3/uL (ref 150–400)
RBC: 4.4 MIL/uL (ref 4.22–5.81)
RDW: 13.3 % (ref 11.5–15.5)
WBC: 41.5 10*3/uL — AB (ref 4.0–10.5)

## 2015-09-26 LAB — HIV ANTIBODY (ROUTINE TESTING W REFLEX): HIV SCREEN 4TH GENERATION: NONREACTIVE

## 2015-09-26 MED ORDER — OXYCODONE HCL 5 MG PO TABS
5.0000 mg | ORAL_TABLET | ORAL | Status: DC | PRN
  Administered 2015-09-27 – 2015-09-28 (×3): 5 mg via ORAL
  Filled 2015-09-26 (×3): qty 1

## 2015-09-26 MED ORDER — ONDANSETRON HCL 4 MG/2ML IJ SOLN: 4.0000 mg | Freq: Three times a day (TID) | INTRAMUSCULAR | Status: AC | PRN

## 2015-09-26 MED ORDER — SODIUM CHLORIDE 0.9 % IV SOLN: INTRAVENOUS | Status: AC

## 2015-09-26 NOTE — Progress Notes (Signed)
Subjective: Patient reports discomfort has improved but swelling has increased.  He is voiding without difficulty.     Objective: Vital signs in last 24 hours: Temp:  [98.5 F (36.9 C)-101.6 F (38.7 C)] 100.4 F (38 C) (07/17 1354) Pulse Rate:  [101-111] 110 (07/17 1354) Resp:  [16-19] 16 (07/17 1354) BP: (101-123)/(48-86) 117/57 mmHg (07/17 1354) SpO2:  [95 %-100 %] 98 % (07/17 1354) Weight:  [75.07 kg (165 lb 8 oz)] 75.07 kg (165 lb 8 oz) (07/16 2319)  Intake/Output from previous day: 07/16 0701 - 07/17 0700 In: 757.5 [I.V.:757.5] Out: -  Intake/Output this shift: Total I/O In: 440 [P.O.:440] Out: -   Physical Exam:  General:alert, cooperative and no distress Male genitalia: no penile lesions or discharge; penile edema present; mild left scrotal edema; tender to palp; no erythema     Lab Results: CBC Latest Ref Rng 09/26/2015 09/25/2015 02/08/2015  WBC 4.0 - 10.5 K/uL 41.5(H) 39.9(H) 6.7  Hemoglobin 13.0 - 17.0 g/dL 78.213.3 95.615.3 21.314.7  Hematocrit 39.0 - 52.0 % 40.0 45.2 44.7  Platelets 150 - 400 K/uL 158 196 269      Recent Labs  09/25/15 1812 09/26/15 0450  HGB 15.3 13.3  HCT 45.2 40.0   BMET  Recent Labs  09/25/15 1812 09/26/15 0450  NA 135 138  K 3.2* 3.7  CL 106 109  CO2 18* 20*  GLUCOSE 138* 112*  BUN 10 15  CREATININE 0.95 1.25*  CALCIUM 9.3 8.1*   No results for input(s): LABPT, INR in the last 72 hours. No results for input(s): LABURIN in the last 72 hours. Results for orders placed or performed during the hospital encounter of 09/25/15  Urine culture     Status: Abnormal (Preliminary result)   Collection Time: 09/25/15  6:27 PM  Result Value Ref Range Status   Specimen Description URINE, CLEAN CATCH  Final   Special Requests NONE  Final   Culture >=100,000 COLONIES/mL PSEUDOMONAS AERUGINOSA (A)  Final   Report Status PENDING  Incomplete  Culture, blood (Routine x 2)     Status: None (Preliminary result)   Collection Time: 09/25/15   7:55 PM  Result Value Ref Range Status   Specimen Description BLOOD LEFT ANTECUBITAL  Final   Special Requests BOTTLES DRAWN AEROBIC AND ANAEROBIC 5CC   Final   Culture NO GROWTH < 24 HOURS  Final   Report Status PENDING  Incomplete  Culture, blood (Routine x 2)     Status: None (Preliminary result)   Collection Time: 09/25/15  9:20 PM  Result Value Ref Range Status   Specimen Description BLOOD RIGHT ANTECUBITAL  Final   Special Requests BOTTLES DRAWN AEROBIC AND ANAEROBIC 5CC  Final   Culture NO GROWTH < 24 HOURS  Final   Report Status PENDING  Incomplete    Studies/Results: Dg Chest 2 View  09/25/2015  CLINICAL DATA:  Patient with pain and swelling to the left testicle. Possible sepsis. EXAM: CHEST  2 VIEW COMPARISON:  Chest radiograph 02/08/2015. FINDINGS: Normal cardiac and mediastinal contours. No consolidative pulmonary opacities. No pleural effusion or pneumothorax. Mid thoracic spine degenerative changes. IMPRESSION: No active cardiopulmonary disease. Electronically Signed   By: Annia Beltrew  Davis M.D.   On: 09/25/2015 19:00   [place] Scrotum  09/25/2015  CLINICAL DATA:  Left testicular pain. EXAM: SCROTAL ULTRASOUND DOPPLER ULTRASOUND OF THE TESTICLES TECHNIQUE: Complete ultrasound examination of the testicles, epididymis, and other scrotal structures was performed. Color and spectral Doppler ultrasound were also utilized to evaluate blood  flow to the testicles. COMPARISON:  None. FINDINGS: Right testicle Measurements: 3.0 x 2.1 x 1.9 cm. No mass or microlithiasis visualized. Left testicle Measurements: 4.1 x 3.3 x 3.2 cm. The left testicles enlarged compared to the right with no masses. Right epididymis:  Normal in size and appearance. Left epididymis: Increased blood flow seen in the left epididymis compared to the right with no mass. Hydrocele:  There is a complex hydrocele on the left. Varicocele:  None visualized. Pulsed Doppler interrogation of both testes demonstrates normal blood flow on the  right. There is decreased arterial an venous blood flow on the left compared to the right. There is increased blood flow within the left epididymis. IMPRESSION: 1. The left testicle is enlarged with decreased arterial and venous blood flow compared to the right. Partial or intermittent torsion is not excluded based on imaging. There is also increased blood flow in the left epididymis which could be seen in the setting of epididymitis. There is a complex hydrocele on the left. Findings discussed with Dr. Jacquelyne Balint Electronically Signed   By: Gerome Sam III M.D   On: 09/25/2015 21:13   [place] Art/ven Flow Abd Pelv Doppler  09/25/2015  CLINICAL DATA:  Left testicular pain. EXAM: SCROTAL ULTRASOUND DOPPLER ULTRASOUND OF THE TESTICLES TECHNIQUE: Complete ultrasound examination of the testicles, epididymis, and other scrotal structures was performed. Color and spectral Doppler ultrasound were also utilized to evaluate blood flow to the testicles. COMPARISON:  None. FINDINGS: Right testicle Measurements: 3.0 x 2.1 x 1.9 cm. No mass or microlithiasis visualized. Left testicle Measurements: 4.1 x 3.3 x 3.2 cm. The left testicles enlarged compared to the right with no masses. Right epididymis:  Normal in size and appearance. Left epididymis: Increased blood flow seen in the left epididymis compared to the right with no mass. Hydrocele:  There is a complex hydrocele on the left. Varicocele:  None visualized. Pulsed Doppler interrogation of both testes demonstrates normal blood flow on the right. There is decreased arterial an venous blood flow on the left compared to the right. There is increased blood flow within the left epididymis. IMPRESSION: 1. The left testicle is enlarged with decreased arterial and venous blood flow compared to the right. Partial or intermittent torsion is not excluded based on imaging. There is also increased blood flow in the left epididymis which could be seen in the setting of epididymitis.  There is a complex hydrocele on the left. Findings discussed with Dr. Jacquelyne Balint Electronically Signed   By: Gerome Sam III M.D   On: 09/25/2015 21:13    Assessment/Plan:      UTI and orchitis--prelim urine culture shows pseudomonas.  Continue IV Abx.  F/u sensitivities and adjust as needed.  Continue scrotal/penile elevation.  Continue nsaids.   LOS: 1 day   Pegge Cumberledge 09/26/2015, 4:21 PM

## 2015-09-26 NOTE — Care Management Important Message (Signed)
Important Message  Patient Details  Name: Stana Buntingrofandro W Marone MRN: [id number] Date of Birth: 1950-07-24   Medicare Important Message Given:  Yes    Bernadette HoitShoffner, Clemmie Buelna Coleman 09/26/2015, 3:11 PM

## 2015-09-26 NOTE — Progress Notes (Signed)
Penile and scrotal edema noted to be more swollen

## 2015-09-26 NOTE — Progress Notes (Addendum)
PROGRESS NOTE    Stana Buntingrofandro W Swindler  WUJ:[id number] DOB: Oct 15, 1950 DOA: 09/25/2015 PCP: Sonda PrimesAlex Plotnikov, MD   Outpatient Specialists:     Brief Narrative:  Patient is a 65 yo male with history of BPH who came with cc of left testicular pain and swelling that started yesterday along with a fever up to 102 associated with dysuria, dizziness and nausea. No recent UTI, no trauma, no penile discharge, no vomiting, no hematuria. He also has mild lower abdominal pain.   Assessment & Plan:   Active Problems:   Orchitis   Sepsis (HCC)   Sepsis due to acute Orchitis / UTI;  Started on ceft and levo daily IVF Urology following- Dr. Patsi Searsannenbaum to see US confirms decreased blood flow to left testicle but no torsion.  Scrotum elevation  Oxycodone  BPH; cont home meds  Asthma: started on alb as needed.   AKI -IVF  Lactic acidosis -IVF -recheck in AM  DVT prophylaxis:  Lovenox   Code Status: Full Code   Family Communication: patient  Disposition Plan:     Consultants:  urology  Procedures:        Subjective: Pain better controlled  Objective: Filed Vitals:   09/25/15 2256 09/25/15 2300 09/25/15 2319 09/26/15 0534  BP:   118/51 123/57  Pulse:   111 110  Temp: 100.8 F (38.2 C) 100.9 F (38.3 C) 98.6 F (37 C) 98.5 F (36.9 C)  TempSrc: Oral  Oral Oral  Resp:   19 19  Height:   5\' 7"  (1.702 m)   Weight:   75.07 kg (165 lb 8 oz)   SpO2:   99% 99%    Intake/Output Summary (Last 24 hours) at 09/26/15 1141 Last data filed at 09/26/15 0400  Gross per 24 hour  Intake  757.5 ml  Output      0 ml  Net  757.5 ml   Filed Weights   09/25/15 2319  Weight: 75.07 kg (165 lb 8 oz)    Examination:  General exam: Appears calm and comfortable  Respiratory system: Clear to auscultation. Respiratory effort normal. Cardiovascular system: S1 & S2 heard, RRR. No JVD, murmurs, rubs, gallops or clicks. No pedal edema. Gastrointestinal system: Abdomen is  nondistended, soft and nontender. No organomegaly or masses felt. Normal bowel sounds heard. Central nervous system: Alert and oriented. No focal neurological deficits. Psychiatry: Judgement and insight appear normal. Mood & affect appropriate.     Data Reviewed: I have personally reviewed following labs and imaging studies  CBC:  Recent Labs Lab 09/25/15 1812 09/26/15 0450  WBC 39.9* 41.5*  NEUTROABS 35.9*  --   HGB 15.3 13.3  HCT 45.2 40.0  MCV 91.3 90.9  PLT 196 158   Basic Metabolic Panel:  Recent Labs Lab 09/25/15 1812 09/26/15 0450  NA 135 138  K 3.2* 3.7  CL 106 109  CO2 18* 20*  GLUCOSE 138* 112*  BUN 10 15  CREATININE 0.95 1.25*  CALCIUM 9.3 8.1*   GFR: Estimated Creatinine Clearance: 55.8 mL/min (by C-G formula based on Cr of 1.25). Liver Function Tests:  Recent Labs Lab 09/25/15 1812 09/26/15 0450  AST 21 18  ALT 15* 14*  ALKPHOS 46 38  BILITOT 1.9* 1.1  PROT 9.1* 5.3*  ALBUMIN 3.7 2.6*   No results for input(s): LIPASE, AMYLASE in the last 168 hours. No results for input(s): AMMONIA in the last 168 hours. Coagulation Profile: No results for input(s): INR, PROTIME in the last 168 hours. Cardiac Enzymes:  No results for input(s): CKTOTAL, CKMB, CKMBINDEX, TROPONINI in the last 168 hours. BNP (last 3 results) No results for input(s): PROBNP in the last 8760 hours. HbA1C: No results for input(s): HGBA1C in the last 72 hours. CBG: No results for input(s): GLUCAP in the last 168 hours. Lipid Profile: No results for input(s): CHOL, HDL, LDLCALC, TRIG, CHOLHDL, LDLDIRECT in the last 72 hours. Thyroid Function Tests: No results for input(s): TSH, T4TOTAL, FREET4, T3FREE, THYROIDAB in the last 72 hours. Anemia Panel: No results for input(s): VITAMINB12, FOLATE, FERRITIN, TIBC, IRON, RETICCTPCT in the last 72 hours. Urine analysis:    Component Value Date/Time   COLORURINE AMBER* 09/25/2015 1828   APPEARANCEUR CLOUDY* 09/25/2015 1828   LABSPEC  1.021 09/25/2015 1828   PHURINE 6.0 09/25/2015 1828   GLUCOSEU NEGATIVE 09/25/2015 1828   GLUCOSEU NEGATIVE 10/04/2014 1124   HGBUR SMALL* 09/25/2015 1828   BILIRUBINUR NEGATIVE 09/25/2015 1828   KETONESUR NEGATIVE 09/25/2015 1828   PROTEINUR NEGATIVE 09/25/2015 1828   UROBILINOGEN 0.2 10/04/2014 1124   NITRITE POSITIVE* 09/25/2015 1828   LEUKOCYTESUR MODERATE* 09/25/2015 1828     )No results found for this or any previous visit (from the past 240 hour(s)).    Anti-infectives    Start     Dose/Rate Route Frequency Ordered Stop   09/26/15 1800  cefTRIAXone (ROCEPHIN) 2 g in dextrose 5 % 50 mL IVPB     2 g 100 mL/hr over 30 Minutes Intravenous Every 24 hours 09/25/15 2148     09/25/15 2300  levofloxacin (LEVAQUIN) IVPB 500 mg     500 mg 100 mL/hr over 60 Minutes Intravenous Daily at bedtime 09/25/15 2137     09/25/15 2000  cefTRIAXone (ROCEPHIN) 1 g in dextrose 5 % 50 mL IVPB     1 g 100 mL/hr over 30 Minutes Intravenous  Once 09/25/15 1957 09/25/15 2138       Radiology Studies: Dg Chest 2 View  09/25/2015  CLINICAL DATA:  Patient with pain and swelling to the left testicle. Possible sepsis. EXAM: CHEST  2 VIEW COMPARISON:  Chest radiograph 02/08/2015. FINDINGS: Normal cardiac and mediastinal contours. No consolidative pulmonary opacities. No pleural effusion or pneumothorax. Mid thoracic spine degenerative changes. IMPRESSION: No active cardiopulmonary disease. Electronically Signed   By: Annia Belt M.D.   On: 09/25/2015 19:00   US Scrotum  09/25/2015  CLINICAL DATA:  Left testicular pain. EXAM: SCROTAL ULTRASOUND DOPPLER ULTRASOUND OF THE TESTICLES TECHNIQUE: Complete ultrasound examination of the testicles, epididymis, and other scrotal structures was performed. Color and spectral Doppler ultrasound were also utilized to evaluate blood flow to the testicles. COMPARISON:  None. FINDINGS: Right testicle Measurements: 3.0 x 2.1 x 1.9 cm. No mass or microlithiasis visualized. Left  testicle Measurements: 4.1 x 3.3 x 3.2 cm. The left testicles enlarged compared to the right with no masses. Right epididymis:  Normal in size and appearance. Left epididymis: Increased blood flow seen in the left epididymis compared to the right with no mass. Hydrocele:  There is a complex hydrocele on the left. Varicocele:  None visualized. Pulsed Doppler interrogation of both testes demonstrates normal blood flow on the right. There is decreased arterial an venous blood flow on the left compared to the right. There is increased blood flow within the left epididymis. IMPRESSION: 1. The left testicle is enlarged with decreased arterial and venous blood flow compared to the right. Partial or intermittent torsion is not excluded based on imaging. There is also increased blood flow in the left  epididymis which could be seen in the setting of epididymitis. There is a complex hydrocele on the left. Findings discussed with Dr. Jacquelyne Balint Electronically Signed   By: Gerome Sam III M.D   On: 09/25/2015 21:13   [place] Art/ven Flow Abd Pelv Doppler  09/25/2015  CLINICAL DATA:  Left testicular pain. EXAM: SCROTAL ULTRASOUND DOPPLER ULTRASOUND OF THE TESTICLES TECHNIQUE: Complete ultrasound examination of the testicles, epididymis, and other scrotal structures was performed. Color and spectral Doppler ultrasound were also utilized to evaluate blood flow to the testicles. COMPARISON:  None. FINDINGS: Right testicle Measurements: 3.0 x 2.1 x 1.9 cm. No mass or microlithiasis visualized. Left testicle Measurements: 4.1 x 3.3 x 3.2 cm. The left testicles enlarged compared to the right with no masses. Right epididymis:  Normal in size and appearance. Left epididymis: Increased blood flow seen in the left epididymis compared to the right with no mass. Hydrocele:  There is a complex hydrocele on the left. Varicocele:  None visualized. Pulsed Doppler interrogation of both testes demonstrates normal blood flow on the right. There is  decreased arterial an venous blood flow on the left compared to the right. There is increased blood flow within the left epididymis. IMPRESSION: 1. The left testicle is enlarged with decreased arterial and venous blood flow compared to the right. Partial or intermittent torsion is not excluded based on imaging. There is also increased blood flow in the left epididymis which could be seen in the setting of epididymitis. There is a complex hydrocele on the left. Findings discussed with Dr. Jacquelyne Balint Electronically Signed   By: Gerome Sam III M.D   On: 09/25/2015 21:13        Scheduled Meds: . sodium chloride   Intravenous STAT  . cefTRIAXone (ROCEPHIN)  IV  2 g Intravenous Q24H  . enoxaparin (LOVENOX) injection  40 mg Subcutaneous QHS  . levofloxacin (LEVAQUIN) IV  500 mg Intravenous QHS  . tamsulosin  0.4 mg Oral Daily   Continuous Infusions: . sodium chloride 75 mL/hr at 09/26/15 0836     LOS: 1 day    Time spent: 35 min    Hareem Surowiec U Sharlena Kristensen, DO [facility] Pager [phone]  If 7PM-7AM, please contact night-coverage [email or web address] Password [facility] 09/26/2015, 11:41 AM

## 2015-09-26 NOTE — Progress Notes (Signed)
   09/26/15 1635  Urine Characteristics  Urinary Incontinence No  Urine Color Yellow/straw  Urine Appearance Clear  Urine Odor No odor  Bladder Scan Volume (mL) 42 mL  Pt bladder scanned as ordered by MD with 42cc residual noted post void

## 2015-09-26 NOTE — Progress Notes (Signed)
Pt refused pneumonia vaccine

## 2015-09-26 NOTE — Progress Notes (Signed)
Patient refused to allow nursing staff to assess scrotal area, stating that scrotal area has been assessed previously by MD. Patient to notify RN of increased swelling, pain, or discharge.

## 2015-09-27 DIAGNOSIS — E876 Hypokalemia: Secondary | ICD-10-CM

## 2015-09-27 LAB — BASIC METABOLIC PANEL
ANION GAP: 8 (ref 5–15)
BUN: 11 mg/dL (ref 6–20)
CO2: 22 mmol/L (ref 22–32)
Calcium: 8.4 mg/dL — ABNORMAL LOW (ref 8.9–10.3)
Chloride: 111 mmol/L (ref 101–111)
Creatinine, Ser: 0.92 mg/dL (ref 0.61–1.24)
GLUCOSE: 88 mg/dL (ref 65–99)
POTASSIUM: 3 mmol/L — AB (ref 3.5–5.1)
Sodium: 141 mmol/L (ref 135–145)

## 2015-09-27 LAB — CBC
HEMATOCRIT: 38.6 % — AB (ref 39.0–52.0)
Hemoglobin: 12.9 g/dL — ABNORMAL LOW (ref 13.0–17.0)
MCH: 29.9 pg (ref 26.0–34.0)
MCHC: 33.4 g/dL (ref 30.0–36.0)
MCV: 89.6 fL (ref 78.0–100.0)
Platelets: 163 10*3/uL (ref 150–400)
RBC: 4.31 MIL/uL (ref 4.22–5.81)
RDW: 13.2 % (ref 11.5–15.5)
WBC: 46 10*3/uL — AB (ref 4.0–10.5)

## 2015-09-27 LAB — LACTIC ACID, PLASMA: LACTIC ACID, VENOUS: 1.3 mmol/L (ref 0.5–1.9)

## 2015-09-27 MED ORDER — POTASSIUM CHLORIDE CRYS ER 20 MEQ PO TBCR
40.0000 meq | EXTENDED_RELEASE_TABLET | ORAL | Status: AC
  Administered 2015-09-27 (×3): 40 meq via ORAL
  Filled 2015-09-27 (×3): qty 2

## 2015-09-27 NOTE — Progress Notes (Addendum)
PROGRESS NOTE    Gordon Lucas  EAV:[id number] DOB: 1950-10-11 DOA: 09/25/2015 PCP: Sonda PrimesAlex Plotnikov, MD   Outpatient Specialists:     Brief Narrative:  Patient is a 65 yo male with history of BPH who came with cc of left testicular pain and swelling that started yesterday along with a fever up to 102 associated with dysuria, dizziness and nausea. No recent UTI, no trauma, no penile discharge, no vomiting, no hematuria. He also has mild lower abdominal pain.   Assessment & Plan:   Active Problems:   Orchitis   Sepsis (HCC)   Sepsis due to acute Orchitis / UTI;  Started on ceft and levo daily- culture showing pseudomonas-- await sensitivities- will narrow to levo IVF Urology following- Dr. Patsi Searsannenbaum to see US confirms decreased blood flow to left testicle but no torsion.  Scrotum elevation  Oxycodone  BPH; cont home meds  Hypokalemia -replete -recheck in AM  leukocytosis -cbc with diff in AM  Asthma: started on alb as needed.   AKI -IVF  Lactic acidosis -resolved with IVf  DVT prophylaxis:  Lovenox   Code Status: Full Code   Family Communication: patient  Disposition Plan:     Consultants:  urology  Procedures:        Subjective: Pain better controlled  Objective: Filed Vitals:   09/26/15 1354 09/26/15 2010 09/27/15 0531 09/27/15 1332  BP: 117/57 107/57 110/64 125/76  Pulse: 110 104 98 102  Temp: 100.4 F (38 C) 99.1 F (37.3 C) 99.1 F (37.3 C) 99.3 F (37.4 C)  TempSrc: Oral Oral Oral Oral  Resp: 16 19 19 18   Height:      Weight:      SpO2: 98% 98% 98% 99%    Intake/Output Summary (Last 24 hours) at 09/27/15 1404 Last data filed at 09/27/15 1333  Gross per 24 hour  Intake 3109.75 ml  Output   2170 ml  Net 939.75 ml   Filed Weights   09/25/15 2319  Weight: 75.07 kg (165 lb 8 oz)    Examination:  General exam: Appears calm and comfortable  Respiratory system: Clear to auscultation. Respiratory effort  normal. Cardiovascular system: S1 & S2 heard, RRR. No JVD, murmurs, rubs, gallops or clicks. No pedal edema. Gastrointestinal system: Abdomen is nondistended, soft and nontender. No organomegaly or masses felt. Normal bowel sounds heard. Central nervous system: Alert and oriented. No focal neurological deficits. Psychiatry: Judgement and insight appear normal. Mood & affect appropriate.     Data Reviewed: I have personally reviewed following labs and imaging studies  CBC:  Recent Labs Lab 09/25/15 1812 09/26/15 0450 09/27/15 0316  WBC 39.9* 41.5* 46.0*  NEUTROABS 35.9*  --   --   HGB 15.3 13.3 12.9*  HCT 45.2 40.0 38.6*  MCV 91.3 90.9 89.6  PLT 196 158 163   Basic Metabolic Panel:  Recent Labs Lab 09/25/15 1812 09/26/15 0450 09/27/15 0316  NA 135 138 141  K 3.2* 3.7 3.0*  CL 106 109 111  CO2 18* 20* 22  GLUCOSE 138* 112* 88  BUN 10 15 11   CREATININE 0.95 1.25* 0.92  CALCIUM 9.3 8.1* 8.4*   GFR: Estimated Creatinine Clearance: 75.8 mL/min (by C-G formula based on Cr of 0.92). Liver Function Tests:  Recent Labs Lab 09/25/15 1812 09/26/15 0450  AST 21 18  ALT 15* 14*  ALKPHOS 46 38  BILITOT 1.9* 1.1  PROT 9.1* 5.3*  ALBUMIN 3.7 2.6*   No results for input(s): LIPASE, AMYLASE in the  last 168 hours. No results for input(s): AMMONIA in the last 168 hours. Coagulation Profile: No results for input(s): INR, PROTIME in the last 168 hours. Cardiac Enzymes: No results for input(s): CKTOTAL, CKMB, CKMBINDEX, TROPONINI in the last 168 hours. BNP (last 3 results) No results for input(s): PROBNP in the last 8760 hours. HbA1C: No results for input(s): HGBA1C in the last 72 hours. CBG: No results for input(s): GLUCAP in the last 168 hours. Lipid Profile: No results for input(s): CHOL, HDL, LDLCALC, TRIG, CHOLHDL, LDLDIRECT in the last 72 hours. Thyroid Function Tests: No results for input(s): TSH, T4TOTAL, FREET4, T3FREE, THYROIDAB in the last 72 hours. Anemia  Panel: No results for input(s): VITAMINB12, FOLATE, FERRITIN, TIBC, IRON, RETICCTPCT in the last 72 hours. Urine analysis:    Component Value Date/Time   COLORURINE AMBER* 09/25/2015 1828   APPEARANCEUR CLOUDY* 09/25/2015 1828   LABSPEC 1.021 09/25/2015 1828   PHURINE 6.0 09/25/2015 1828   GLUCOSEU NEGATIVE 09/25/2015 1828   GLUCOSEU NEGATIVE 10/04/2014 1124   HGBUR SMALL* 09/25/2015 1828   BILIRUBINUR NEGATIVE 09/25/2015 1828   KETONESUR NEGATIVE 09/25/2015 1828   PROTEINUR NEGATIVE 09/25/2015 1828   UROBILINOGEN 0.2 10/04/2014 1124   NITRITE POSITIVE* 09/25/2015 1828   LEUKOCYTESUR MODERATE* 09/25/2015 1828     ) Recent Results (from the past 240 hour(s))  Urine culture     Status: Abnormal (Preliminary result)   Collection Time: 09/25/15  6:27 PM  Result Value Ref Range Status   Specimen Description URINE, CLEAN CATCH  Final   Special Requests NONE  Final   Culture (A)  Final    >=100,000 COLONIES/mL PSEUDOMONAS AERUGINOSA SUSCEPTIBILITIES TO FOLLOW    Report Status PENDING  Incomplete  Culture, blood (Routine x 2)     Status: None (Preliminary result)   Collection Time: 09/25/15  7:55 PM  Result Value Ref Range Status   Specimen Description BLOOD LEFT ANTECUBITAL  Final   Special Requests BOTTLES DRAWN AEROBIC AND ANAEROBIC 5CC   Final   Culture NO GROWTH < 24 HOURS  Final   Report Status PENDING  Incomplete  Culture, blood (Routine x 2)     Status: None (Preliminary result)   Collection Time: 09/25/15  9:20 PM  Result Value Ref Range Status   Specimen Description BLOOD RIGHT ANTECUBITAL  Final   Special Requests BOTTLES DRAWN AEROBIC AND ANAEROBIC 5CC  Final   Culture NO GROWTH < 24 HOURS  Final   Report Status PENDING  Incomplete      Anti-infectives    Start     Dose/Rate Route Frequency Ordered Stop   09/26/15 1800  cefTRIAXone (ROCEPHIN) 2 g in dextrose 5 % 50 mL IVPB     2 g 100 mL/hr over 30 Minutes Intravenous Every 24 hours 09/25/15 2148      09/25/15 2300  levofloxacin (LEVAQUIN) IVPB 500 mg     500 mg 100 mL/hr over 60 Minutes Intravenous Daily at bedtime 09/25/15 2137     09/25/15 2000  cefTRIAXone (ROCEPHIN) 1 g in dextrose 5 % 50 mL IVPB     1 g 100 mL/hr over 30 Minutes Intravenous  Once 09/25/15 1957 09/25/15 2138       Radiology Studies: Dg Chest 2 View  09/25/2015  CLINICAL DATA:  Patient with pain and swelling to the left testicle. Possible sepsis. EXAM: CHEST  2 VIEW COMPARISON:  Chest radiograph 02/08/2015. FINDINGS: Normal cardiac and mediastinal contours. No consolidative pulmonary opacities. No pleural effusion or pneumothorax. Mid thoracic spine  degenerative changes. IMPRESSION: No active cardiopulmonary disease. Electronically Signed   By: Annia Belt M.D.   On: 09/25/2015 19:00   US Scrotum  09/25/2015  CLINICAL DATA:  Left testicular pain. EXAM: SCROTAL ULTRASOUND DOPPLER ULTRASOUND OF THE TESTICLES TECHNIQUE: Complete ultrasound examination of the testicles, epididymis, and other scrotal structures was performed. Color and spectral Doppler ultrasound were also utilized to evaluate blood flow to the testicles. COMPARISON:  None. FINDINGS: Right testicle Measurements: 3.0 x 2.1 x 1.9 cm. No mass or microlithiasis visualized. Left testicle Measurements: 4.1 x 3.3 x 3.2 cm. The left testicles enlarged compared to the right with no masses. Right epididymis:  Normal in size and appearance. Left epididymis: Increased blood flow seen in the left epididymis compared to the right with no mass. Hydrocele:  There is a complex hydrocele on the left. Varicocele:  None visualized. Pulsed Doppler interrogation of both testes demonstrates normal blood flow on the right. There is decreased arterial an venous blood flow on the left compared to the right. There is increased blood flow within the left epididymis. IMPRESSION: 1. The left testicle is enlarged with decreased arterial and venous blood flow compared to the right. Partial or  intermittent torsion is not excluded based on imaging. There is also increased blood flow in the left epididymis which could be seen in the setting of epididymitis. There is a complex hydrocele on the left. Findings discussed with Dr. Jacquelyne Balint Electronically Signed   By: Gerome Sam III M.D   On: 09/25/2015 21:13   [place] Art/ven Flow Abd Pelv Doppler  09/25/2015  CLINICAL DATA:  Left testicular pain. EXAM: SCROTAL ULTRASOUND DOPPLER ULTRASOUND OF THE TESTICLES TECHNIQUE: Complete ultrasound examination of the testicles, epididymis, and other scrotal structures was performed. Color and spectral Doppler ultrasound were also utilized to evaluate blood flow to the testicles. COMPARISON:  None. FINDINGS: Right testicle Measurements: 3.0 x 2.1 x 1.9 cm. No mass or microlithiasis visualized. Left testicle Measurements: 4.1 x 3.3 x 3.2 cm. The left testicles enlarged compared to the right with no masses. Right epididymis:  Normal in size and appearance. Left epididymis: Increased blood flow seen in the left epididymis compared to the right with no mass. Hydrocele:  There is a complex hydrocele on the left. Varicocele:  None visualized. Pulsed Doppler interrogation of both testes demonstrates normal blood flow on the right. There is decreased arterial an venous blood flow on the left compared to the right. There is increased blood flow within the left epididymis. IMPRESSION: 1. The left testicle is enlarged with decreased arterial and venous blood flow compared to the right. Partial or intermittent torsion is not excluded based on imaging. There is also increased blood flow in the left epididymis which could be seen in the setting of epididymitis. There is a complex hydrocele on the left. Findings discussed with Dr. Jacquelyne Balint Electronically Signed   By: Gerome Sam III M.D   On: 09/25/2015 21:13        Scheduled Meds: . cefTRIAXone (ROCEPHIN)  IV  2 g Intravenous Q24H  . enoxaparin (LOVENOX) injection  40  mg Subcutaneous QHS  . levofloxacin (LEVAQUIN) IV  500 mg Intravenous QHS  . potassium chloride  40 mEq Oral Q4H  . tamsulosin  0.4 mg Oral Daily   Continuous Infusions: . sodium chloride 75 mL/hr at 09/27/15 1154     LOS: 2 days    Time spent: 35 min    Nainoa Woldt Juanetta Gosling, DO [facility] Pager [phone]  If  7PM-7AM, please contact night-coverage [email or web address] Password [facility] 09/27/2015, 2:04 PM

## 2015-09-27 NOTE — Progress Notes (Signed)
Assessment: 1.  Urine c/s + Pseudomonas. Sensitivities pending.  Antibiotic:Rocephin/Levaquin.                          2. Exam today: Left testis less painful. Scrotum softer. It will take 3-4 weeks for testis to soften, if it responds to antibiotic ( discussed with pt and wife. T 991 overnight. I have explained to them that he appeared to be very close to needing orchiectomy for his infection-but that it appears now that he is responding to antibiotics, and will be able to avoid surgical intervention.                            Plan:  1. Change to oral anti-pseudomonas antibiotic when c/s returns.             2. RTC for f/u ; 3 weeks            3. Advise pt to eat yogurt each day to replace gut bacteria.            4. Hot pack or hot bath at home to help resolve reactive orchitis.             5. Please note in medical hx: pt had ureteral stone-no hx of bladder tumor.     Subjective: Patient reports less swelling in right scrotum, and less pain in Right scrotum overnight.   Objective: Vital signs in last 24 hours: Temp:  [99.1 F (37.3 C)-100.4 F (38 C)] 99.1 F (37.3 C) (07/18 0531) Pulse Rate:  [98-110] 98 (07/18 0531) Resp:  [16-19] 19 (07/18 0531) BP: (107-117)/(57-64) 110/64 mmHg (07/18 0531) SpO2:  [98 %] 98 % (07/18 0531)A  Intake/Output from previous day: 07/17 0701 - 07/18 0700 In: 2709.8 [P.O.:440; I.V.:2269.8] Out: 1195 [Urine:1195] Intake/Output this shift:    Past Medical History  Diagnosis Date  . Low back pain   . BPH (benign prostatic hypertrophy)   . COPD (chronic obstructive pulmonary disease) (HCC)   . ED (erectile dysfunction)   . Osteoporosis   . Microscopic hematuria     s/p Urol w/up  . Asthma   . Arthritis   . Imbalance   . Kidney stones   . Urinary hesitancy     Physical Exam:  Lungs - Normal respiratory effort, chest expands symmetrically.  Abdomen - Soft, non-tender & non-distended.  Lab Results:  Recent Labs  09/25/15 1812  09/26/15 0450 09/27/15 0316  WBC 39.9* 41.5* 46.0*  HGB 15.3 13.3 12.9*  HCT 45.2 40.0 38.6*   BMET  Recent Labs  09/26/15 0450 09/27/15 0316  NA 138 141  K 3.7 3.0*  CL 109 111  CO2 20* 22  GLUCOSE 112* 88  BUN 15 11  CREATININE 1.25* 0.92  CALCIUM 8.1* 8.4*   No results for input(s): LABURIN in the last 72 hours. Results for orders placed or performed during the hospital encounter of 09/25/15  Urine culture     Status: Abnormal (Preliminary result)   Collection Time: 09/25/15  6:27 PM  Result Value Ref Range Status   Specimen Description URINE, CLEAN CATCH  Final   Special Requests NONE  Final   Culture >=100,000 COLONIES/mL PSEUDOMONAS AERUGINOSA (A)  Final   Report Status PENDING  Incomplete  Culture, blood (Routine x 2)     Status: None (Preliminary result)   Collection Time: 09/25/15  7:55 PM  Result Value Ref Range  Status   Specimen Description BLOOD LEFT ANTECUBITAL  Final   Special Requests BOTTLES DRAWN AEROBIC AND ANAEROBIC 5CC   Final   Culture NO GROWTH < 24 HOURS  Final   Report Status PENDING  Incomplete  Culture, blood (Routine x 2)     Status: None (Preliminary result)   Collection Time: 09/25/15  9:20 PM  Result Value Ref Range Status   Specimen Description BLOOD RIGHT ANTECUBITAL  Final   Special Requests BOTTLES DRAWN AEROBIC AND ANAEROBIC 5CC  Final   Culture NO GROWTH < 24 HOURS  Final   Report Status PENDING  Incomplete    Studies/Results: No results found.    Gordon Lucas I Gordon Lucas 09/27/2015, 7:30 AM

## 2015-09-28 DIAGNOSIS — N452 Orchitis: Secondary | ICD-10-CM

## 2015-09-28 DIAGNOSIS — E876 Hypokalemia: Secondary | ICD-10-CM

## 2015-09-28 LAB — BLOOD CULTURE ID PANEL (REFLEXED)
Acinetobacter baumannii: NOT DETECTED
CANDIDA KRUSEI: NOT DETECTED
CANDIDA PARAPSILOSIS: NOT DETECTED
CARBAPENEM RESISTANCE: NOT DETECTED
Candida albicans: NOT DETECTED
Candida glabrata: NOT DETECTED
Candida tropicalis: NOT DETECTED
Enterobacter cloacae complex: NOT DETECTED
Enterobacteriaceae species: NOT DETECTED
Enterococcus species: NOT DETECTED
Escherichia coli: NOT DETECTED
Haemophilus influenzae: NOT DETECTED
KLEBSIELLA OXYTOCA: NOT DETECTED
KLEBSIELLA PNEUMONIAE: NOT DETECTED
LISTERIA MONOCYTOGENES: NOT DETECTED
Methicillin resistance: NOT DETECTED
Neisseria meningitidis: NOT DETECTED
PROTEUS SPECIES: NOT DETECTED
Pseudomonas aeruginosa: NOT DETECTED
SERRATIA MARCESCENS: NOT DETECTED
STAPHYLOCOCCUS SPECIES: NOT DETECTED
STREPTOCOCCUS AGALACTIAE: NOT DETECTED
Staphylococcus aureus (BCID): NOT DETECTED
Streptococcus pneumoniae: NOT DETECTED
Streptococcus pyogenes: NOT DETECTED
Streptococcus species: NOT DETECTED
Vancomycin resistance: NOT DETECTED

## 2015-09-28 LAB — CBC WITH DIFFERENTIAL/PLATELET
BASOS PCT: 0 %
Basophils Absolute: 0 10*3/uL (ref 0.0–0.1)
EOS ABS: 0 10*3/uL (ref 0.0–0.7)
Eosinophils Relative: 0 %
HCT: 38 % — ABNORMAL LOW (ref 39.0–52.0)
Hemoglobin: 12.7 g/dL — ABNORMAL LOW (ref 13.0–17.0)
Lymphocytes Relative: 5 %
Lymphs Abs: 2.2 10*3/uL (ref 0.7–4.0)
MCH: 29.9 pg (ref 26.0–34.0)
MCHC: 33.4 g/dL (ref 30.0–36.0)
MCV: 89.4 fL (ref 78.0–100.0)
MONO ABS: 2.6 10*3/uL — AB (ref 0.1–1.0)
Monocytes Relative: 6 %
NEUTROS ABS: 39.2 10*3/uL — AB (ref 1.7–7.7)
NEUTROS PCT: 89 %
PLATELETS: 163 10*3/uL (ref 150–400)
RBC: 4.25 MIL/uL (ref 4.22–5.81)
RDW: 13 % (ref 11.5–15.5)
WBC: 44 10*3/uL — ABNORMAL HIGH (ref 4.0–10.5)

## 2015-09-28 LAB — URINE CULTURE

## 2015-09-28 LAB — BASIC METABOLIC PANEL
Anion gap: 7 (ref 5–15)
BUN: 7 mg/dL (ref 6–20)
CALCIUM: 8.6 mg/dL — AB (ref 8.9–10.3)
CO2: 21 mmol/L — AB (ref 22–32)
CREATININE: 0.82 mg/dL (ref 0.61–1.24)
Chloride: 109 mmol/L (ref 101–111)
GLUCOSE: 94 mg/dL (ref 65–99)
Potassium: 3.7 mmol/L (ref 3.5–5.1)
Sodium: 137 mmol/L (ref 135–145)

## 2015-09-28 NOTE — Progress Notes (Signed)
PROGRESS NOTE    Gordon Lucas  ZOX:[id number] DOB: 06/08/1950 DOA: 09/25/2015 PCP: Sonda PrimesAlex Plotnikov, MD   Outpatient Specialists:     Brief Narrative:  Patient is a 65 yo male with history of BPH who came with cc of left testicular pain and swelling that started yesterday along with a fever up to 102 associated with dysuria, dizziness and nausea. No recent UTI, no trauma, no penile discharge, no vomiting, no hematuria. He also has mild lower abdominal pain.   Assessment & Plan:   Active Problems:   Orchitis   Sepsis (HCC)   Hypokalemia   Sepsis due to acute Orchitis / UTI;  Started on ceft and levo daily- culture showing pseudomonas--TRANSITIONED TO levofloxacin. Urology consulted and recommended RTC follow up in 3 weeks. Continue with antibiotics to avoid surgical intervention.  US confirms decreased blood flow to left testicle but no torsion.  Scrotum elevation  Pain control.   BPH; cont home meds  Hypokalemia Repleted. And repeat is normal.   leukocytosis -possibly from orchitis, improving. Monitor intermittently.  Asthma:  No wheezing heard.  started on alb as needed.   Acute kidney injury; resolved with hydration,   Lactic acidosis -resolved with IVf  DVT prophylaxis:  Lovenox   Code Status: Full Code   Family Communication: None at bedside.   Disposition Plan:  Possibly home in 2 days.    Consultants:  urology  Procedures:   none     Subjective: Pain  And swelling better   Objective: Filed Vitals:   09/27/15 1332 09/27/15 2149 09/28/15 0534 09/28/15 1420  BP: 125/76 126/76 138/68 141/78  Pulse: 102 102 94 94  Temp: 99.3 F (37.4 C) 99.6 F (37.6 C) 99.3 F (37.4 C) 98.1 F (36.7 C)  TempSrc: Oral Oral Oral Oral  Resp: 18 19 18 19   Height:      Weight:      SpO2: 99% 97% 97% 98%    Intake/Output Summary (Last 24 hours) at 09/28/15 1834 Last data filed at 09/28/15 1600  Gross per 24 hour  Intake 1307.5 ml    Output   2025 ml  Net -717.5 ml   Filed Weights   09/25/15 2319  Weight: 75.07 kg (165 lb 8 oz)    Examination:  General exam: Appears calm and comfortable  Respiratory system: Clear to auscultation. Respiratory effort normal. Cardiovascular system: S1 & S2 heard, RRR. No JVD, murmurs, rubs, gallops or clicks. No pedal edema. Gastrointestinal system: Abdomen is nondistended, soft and nontender. No organomegaly or masses felt. Normal bowel sounds heard. Central nervous system: Alert and oriented. No focal neurological deficits.     Data Reviewed: I have personally reviewed following labs and imaging studies  CBC:  Recent Labs Lab 09/25/15 1812 09/26/15 0450 09/27/15 0316 09/28/15 0204  WBC 39.9* 41.5* 46.0* 44.0*  NEUTROABS 35.9*  --   --  39.2*  HGB 15.3 13.3 12.9* 12.7*  HCT 45.2 40.0 38.6* 38.0*  MCV 91.3 90.9 89.6 89.4  PLT 196 158 163 163   Basic Metabolic Panel:  Recent Labs Lab 09/25/15 1812 09/26/15 0450 09/27/15 0316 09/28/15 0204  NA 135 138 141 137  K 3.2* 3.7 3.0* 3.7  CL 106 109 111 109  CO2 18* 20* 22 21*  GLUCOSE 138* 112* 88 94  BUN 10 15 11 7   CREATININE 0.95 1.25* 0.92 0.82  CALCIUM 9.3 8.1* 8.4* 8.6*   GFR: Estimated Creatinine Clearance: 85.1 mL/min (by C-G formula based on Cr of 0.82).  Liver Function Tests:  Recent Labs Lab 09/25/15 1812 09/26/15 0450  AST 21 18  ALT 15* 14*  ALKPHOS 46 38  BILITOT 1.9* 1.1  PROT 9.1* 5.3*  ALBUMIN 3.7 2.6*   No results for input(s): LIPASE, AMYLASE in the last 168 hours. No results for input(s): AMMONIA in the last 168 hours. Coagulation Profile: No results for input(s): INR, PROTIME in the last 168 hours. Cardiac Enzymes: No results for input(s): CKTOTAL, CKMB, CKMBINDEX, TROPONINI in the last 168 hours. BNP (last 3 results) No results for input(s): PROBNP in the last 8760 hours. HbA1C: No results for input(s): HGBA1C in the last 72 hours. CBG: No results for input(s): GLUCAP in  the last 168 hours. Lipid Profile: No results for input(s): CHOL, HDL, LDLCALC, TRIG, CHOLHDL, LDLDIRECT in the last 72 hours. Thyroid Function Tests: No results for input(s): TSH, T4TOTAL, FREET4, T3FREE, THYROIDAB in the last 72 hours. Anemia Panel: No results for input(s): VITAMINB12, FOLATE, FERRITIN, TIBC, IRON, RETICCTPCT in the last 72 hours. Urine analysis:    Component Value Date/Time   COLORURINE AMBER* 09/25/2015 1828   APPEARANCEUR CLOUDY* 09/25/2015 1828   LABSPEC 1.021 09/25/2015 1828   PHURINE 6.0 09/25/2015 1828   GLUCOSEU NEGATIVE 09/25/2015 1828   GLUCOSEU NEGATIVE 10/04/2014 1124   HGBUR SMALL* 09/25/2015 1828   BILIRUBINUR NEGATIVE 09/25/2015 1828   KETONESUR NEGATIVE 09/25/2015 1828   PROTEINUR NEGATIVE 09/25/2015 1828   UROBILINOGEN 0.2 10/04/2014 1124   NITRITE POSITIVE* 09/25/2015 1828   LEUKOCYTESUR MODERATE* 09/25/2015 1828     ) Recent Results (from the past 240 hour(s))  Urine culture     Status: Abnormal   Collection Time: 09/25/15  6:27 PM  Result Value Ref Range Status   Specimen Description URINE, CLEAN CATCH  Final   Special Requests NONE  Final   Culture >=100,000 COLONIES/mL PSEUDOMONAS AERUGINOSA (A)  Final   Report Status 09/28/2015 FINAL  Final   Organism ID, Bacteria PSEUDOMONAS AERUGINOSA (A)  Final      Susceptibility   Pseudomonas aeruginosa - MIC*    CEFTAZIDIME 4 SENSITIVE Sensitive     CIPROFLOXACIN <=0.25 SENSITIVE Sensitive     GENTAMICIN 2 SENSITIVE Sensitive     IMIPENEM 2 SENSITIVE Sensitive     PIP/TAZO 16 SENSITIVE Sensitive     CEFEPIME 4 SENSITIVE Sensitive     * >=100,000 COLONIES/mL PSEUDOMONAS AERUGINOSA  Culture, blood (Routine x 2)     Status: None (Preliminary result)   Collection Time: 09/25/15  7:55 PM  Result Value Ref Range Status   Specimen Description BLOOD LEFT ANTECUBITAL  Final   Special Requests BOTTLES DRAWN AEROBIC AND ANAEROBIC 5CC   Final   Culture  Setup Time   Final    GRAM POSITIVE COCCI IN  CLUSTERS AEROBIC BOTTLE ONLY CRITICAL RESULT CALLED TO, READ BACK BY AND VERIFIED WITH: C. STEWART, PHARMD AT 0850 ON 09/28/15 BY C. JESSUP, MLT.    Culture NO GROWTH 3 DAYS  Final   Report Status PENDING  Incomplete  Blood Culture ID Panel (Reflexed)     Status: None   Collection Time: 09/25/15  7:55 PM  Result Value Ref Range Status   Enterococcus species NOT DETECTED NOT DETECTED Final   Vancomycin resistance NOT DETECTED NOT DETECTED Final   Listeria monocytogenes NOT DETECTED NOT DETECTED Final   Staphylococcus species NOT DETECTED NOT DETECTED Final   Staphylococcus aureus NOT DETECTED NOT DETECTED Final   Methicillin resistance NOT DETECTED NOT DETECTED Final   Streptococcus species  NOT DETECTED NOT DETECTED Final   Streptococcus agalactiae NOT DETECTED NOT DETECTED Final   Streptococcus pneumoniae NOT DETECTED NOT DETECTED Final   Streptococcus pyogenes NOT DETECTED NOT DETECTED Final   Acinetobacter baumannii NOT DETECTED NOT DETECTED Final   Enterobacteriaceae species NOT DETECTED NOT DETECTED Final   Enterobacter cloacae complex NOT DETECTED NOT DETECTED Final   Escherichia coli NOT DETECTED NOT DETECTED Final   Klebsiella oxytoca NOT DETECTED NOT DETECTED Final   Klebsiella pneumoniae NOT DETECTED NOT DETECTED Final   Proteus species NOT DETECTED NOT DETECTED Final   Serratia marcescens NOT DETECTED NOT DETECTED Final   Carbapenem resistance NOT DETECTED NOT DETECTED Final   Haemophilus influenzae NOT DETECTED NOT DETECTED Final   Neisseria meningitidis NOT DETECTED NOT DETECTED Final   Pseudomonas aeruginosa NOT DETECTED NOT DETECTED Final   Candida albicans NOT DETECTED NOT DETECTED Final   Candida glabrata NOT DETECTED NOT DETECTED Final   Candida krusei NOT DETECTED NOT DETECTED Final   Candida parapsilosis NOT DETECTED NOT DETECTED Final   Candida tropicalis NOT DETECTED NOT DETECTED Final  Culture, blood (Routine x 2)     Status: None (Preliminary result)    Collection Time: 09/25/15  9:20 PM  Result Value Ref Range Status   Specimen Description BLOOD RIGHT ANTECUBITAL  Final   Special Requests BOTTLES DRAWN AEROBIC AND ANAEROBIC 5CC  Final   Culture NO GROWTH 3 DAYS  Final   Report Status PENDING  Incomplete      Anti-infectives    Start     Dose/Rate Route Frequency Ordered Stop   09/26/15 1800  cefTRIAXone (ROCEPHIN) 2 g in dextrose 5 % 50 mL IVPB  Status:  Discontinued     2 g 100 mL/hr over 30 Minutes Intravenous Every 24 hours 09/25/15 2148 09/27/15 1515   09/25/15 2300  levofloxacin (LEVAQUIN) IVPB 500 mg     500 mg 100 mL/hr over 60 Minutes Intravenous Daily at bedtime 09/25/15 2137     09/25/15 2000  cefTRIAXone (ROCEPHIN) 1 g in dextrose 5 % 50 mL IVPB     1 g 100 mL/hr over 30 Minutes Intravenous  Once 09/25/15 1957 09/25/15 2138       Radiology Studies: No results found.      Scheduled Meds: . enoxaparin (LOVENOX) injection  40 mg Subcutaneous QHS  . levofloxacin (LEVAQUIN) IV  500 mg Intravenous QHS  . tamsulosin  0.4 mg Oral Daily   Continuous Infusions: . sodium chloride 75 mL/hr at 09/28/15 0435     LOS: 3 days    Time spent: 25 min    Weslee Prestage, MD [facility] Pager [phone]  If 7PM-7AM, please contact night-coverage [email or web address] Password [facility] 09/28/2015, 6:34 PM

## 2015-09-28 NOTE — Care Management Important Message (Signed)
Important Message  Patient Details  Name: Stana Buntingrofandro W Husby MRN: [id number] Date of Birth: July 03, 1950   Medicare Important Message Given:  Yes    Bernadette HoitShoffner, Faheem Ziemann Coleman 09/28/2015, 10:13 AM

## 2015-09-28 NOTE — Progress Notes (Signed)
  PHARMACY - PHYSICIAN COMMUNICATION CRITICAL VALUE ALERT - BLOOD CULTURE IDENTIFICATION (BCID)  Results for orders placed or performed during the hospital encounter of 09/25/15  Blood Culture ID Panel (Reflexed) (Collected: 09/25/2015  7:55 PM)  Result Value Ref Range   Enterococcus species NOT DETECTED NOT DETECTED   Vancomycin resistance NOT DETECTED NOT DETECTED   Listeria monocytogenes NOT DETECTED NOT DETECTED   Staphylococcus species NOT DETECTED NOT DETECTED   Staphylococcus aureus NOT DETECTED NOT DETECTED   Methicillin resistance NOT DETECTED NOT DETECTED   Streptococcus species NOT DETECTED NOT DETECTED   Streptococcus agalactiae NOT DETECTED NOT DETECTED   Streptococcus pneumoniae NOT DETECTED NOT DETECTED   Streptococcus pyogenes NOT DETECTED NOT DETECTED   Acinetobacter baumannii NOT DETECTED NOT DETECTED   Enterobacteriaceae species NOT DETECTED NOT DETECTED   Enterobacter cloacae complex NOT DETECTED NOT DETECTED   Escherichia coli NOT DETECTED NOT DETECTED   Klebsiella oxytoca NOT DETECTED NOT DETECTED   Klebsiella pneumoniae NOT DETECTED NOT DETECTED   Proteus species NOT DETECTED NOT DETECTED   Serratia marcescens NOT DETECTED NOT DETECTED   Carbapenem resistance NOT DETECTED NOT DETECTED   Haemophilus influenzae NOT DETECTED NOT DETECTED   Neisseria meningitidis NOT DETECTED NOT DETECTED   Pseudomonas aeruginosa NOT DETECTED NOT DETECTED   Candida albicans NOT DETECTED NOT DETECTED   Candida glabrata NOT DETECTED NOT DETECTED   Candida krusei NOT DETECTED NOT DETECTED   Candida parapsilosis NOT DETECTED NOT DETECTED   Candida tropicalis NOT DETECTED NOT DETECTED    Name of physician (or Provider) Contacted: Dr. Blake DivineAkula  Changes to prescribed antibiotics required: Patient with 1 out of 2 blood cultures positive with gram positive cocci in clusters.  BCID - none detected.  Will need to wait until culture finalizes (could be coag negative staph or micrococcus).    Cassie L. Roseanne RenoStewart, PharmD Infectious Diseases Clinical Pharmacist Pager: [phone] 09/28/2015 9:02 AM

## 2015-09-29 ENCOUNTER — Encounter (HOSPITAL_COMMUNITY): Payer: Self-pay | Admitting: General Practice

## 2015-09-29 DIAGNOSIS — A419 Sepsis, unspecified organism: Principal | ICD-10-CM

## 2015-09-29 MED ORDER — ENSURE ENLIVE PO LIQD
237.0000 mL | Freq: Two times a day (BID) | ORAL | Status: DC
  Administered 2015-09-29 – 2015-09-30 (×2): 237 mL via ORAL

## 2015-09-29 MED ORDER — HYDROCODONE-ACETAMINOPHEN 7.5-325 MG PO TABS
1.0000 | ORAL_TABLET | Freq: Four times a day (QID) | ORAL | Status: DC | PRN
  Administered 2015-09-29 – 2015-09-30 (×3): 1 via ORAL
  Filled 2015-09-29 (×3): qty 1

## 2015-09-29 NOTE — Progress Notes (Signed)
Pt verbalized that he prefers hydrocodone for pain instead of the current order of oxycodone. MD txt paged with pt's message

## 2015-09-29 NOTE — Progress Notes (Signed)
PROGRESS NOTE    Gordon Lucas  RUE:[id number] DOB: November 11, 1950 DOA: 09/25/2015 PCP: Sonda Primes, MD   Outpatient Specialists:     Brief Narrative:  Patient is a 65 yo male with history of BPH who came with cc of left testicular pain and swelling that started yesterday along with a fever up to 102 associated with dysuria, dizziness and nausea. No recent UTI, no trauma, no penile discharge, no vomiting, no hematuria. He also has mild lower abdominal pain. He was admitted for orchitis .   Assessment & Plan:   Active Problems:   Orchitis   Sepsis (HCC)   Hypokalemia   Sepsis due to acute Orchitis / UTI;  Started on ceft and levo daily- culture showing pseudomonas--TRANSITIONED TO levofloxacin. Urology consulted and recommended RTC follow up in 3 weeks. Continue with antibiotics to avoid surgical intervention.  [place] confirms decreased blood flow to left testicle but no torsion.  Scrotum elevation  Pain control with oxy initially, pt reported pain control better with norco.  Monitor another 24 hours on IV antibiotics and if no fever , will plan for discharge.   BPH; cont home meds  Hypokalemia Repleted. And repeat is normal.   leukocytosis -possibly from orchitis, improving. Monitor intermittently. Repeat CBC in am.   Asthma:  No wheezing heard.  started on alb as needed.   Acute kidney injury; resolved with hydration,   Lactic acidosis -resolved with IVf  DVT prophylaxis:  Lovenox   Code Status: Full Code   Family Communication: None at bedside.   Disposition Plan:  Possibly home tomorrow.     Consultants:  urology  Procedures:   none     Subjective: Pain  Slightly worsened today.   Objective: Filed Vitals:   09/28/15 2150 09/29/15 0454 09/29/15 1436 09/29/15 1735  BP: 131/73 133/79 143/73   Pulse: 87 89 92   Temp: 99.2 F (37.3 C) 99.5 F (37.5 C) 99.7 F (37.6 C) 99.3 F (37.4 C)  TempSrc: Oral Oral Oral Oral  Resp: 18 18  18    Height:      Weight:      SpO2: 94% 98% 99%     Intake/Output Summary (Last 24 hours) at 09/29/15 1944 Last data filed at 09/29/15 1853  Gross per 24 hour  Intake   2370 ml  Output   4050 ml  Net  -1680 ml   Filed Weights   09/25/15 2319  Weight: 75.07 kg (165 lb 8 oz)    Examination:  General exam: Appears calm and comfortable  Respiratory system: Clear to auscultation. Respiratory effort normal. Cardiovascular system: S1 & S2 heard, RRR. No JVD, murmurs, rubs, gallops or clicks. No pedal edema. Gastrointestinal system: Abdomen is nondistended, soft and nontender. No organomegaly or masses felt. Normal bowel sounds heard. Central nervous system: Alert and oriented. No focal neurological deficits.     Data Reviewed: I have personally reviewed following labs and imaging studies  CBC:  Recent Labs Lab 09/25/15 1812 09/26/15 0450 09/27/15 0316 09/28/15 0204  WBC 39.9* 41.5* 46.0* 44.0*  NEUTROABS 35.9*  --   --  39.2*  HGB 15.3 13.3 12.9* 12.7*  HCT 45.2 40.0 38.6* 38.0*  MCV 91.3 90.9 89.6 89.4  PLT 196 158 163 163   Basic Metabolic Panel:  Recent Labs Lab 09/25/15 1812 09/26/15 0450 09/27/15 0316 09/28/15 0204  NA 135 138 141 137  K 3.2* 3.7 3.0* 3.7  CL 106 109 111 109  CO2 18* 20* 22 21*  GLUCOSE 138* 112* 88 94  BUN CREATININE 0.95 1.25* 0.92 0.82  CALCIUM 9.3 8.1* 8.4* 8.6*   GFR: Estimated Creatinine Clearance: 85.1 mL/min (by C-G formula based on Cr of 0.82). Liver Function Tests:  Recent Labs Lab 09/25/15 1812 09/26/15 0450  AST 21 18  ALT 15* 14*  ALKPHOS 46 38  BILITOT 1.9* 1.1  PROT 9.1* 5.3*  ALBUMIN 3.7 2.6*   No results for input(s): LIPASE, AMYLASE in the last 168 hours. No results for input(s): AMMONIA in the last 168 hours. Coagulation Profile: No results for input(s): INR, PROTIME in the last 168 hours. Cardiac Enzymes: No results for input(s): CKTOTAL, CKMB, CKMBINDEX, TROPONINI in the last 168  hours. BNP (last 3 results) No results for input(s): PROBNP in the last 8760 hours. HbA1C: No results for input(s): HGBA1C in the last 72 hours. CBG: No results for input(s): GLUCAP in the last 168 hours. Lipid Profile: No results for input(s): CHOL, HDL, LDLCALC, TRIG, CHOLHDL, LDLDIRECT in the last 72 hours. Thyroid Function Tests: No results for input(s): TSH, T4TOTAL, FREET4, T3FREE, THYROIDAB in the last 72 hours. Anemia Panel: No results for input(s): VITAMINB12, FOLATE, FERRITIN, TIBC, IRON, RETICCTPCT in the last 72 hours. Urine analysis:    Component Value Date/Time   COLORURINE AMBER* 09/25/2015 1828   APPEARANCEUR CLOUDY* 09/25/2015 1828   LABSPEC 1.021 09/25/2015 1828   PHURINE 6.0 09/25/2015 1828   GLUCOSEU NEGATIVE 09/25/2015 1828   GLUCOSEU NEGATIVE 10/04/2014 1124   HGBUR SMALL* 09/25/2015 1828   BILIRUBINUR NEGATIVE 09/25/2015 1828   KETONESUR NEGATIVE 09/25/2015 1828   PROTEINUR NEGATIVE 09/25/2015 1828   UROBILINOGEN 0.2 10/04/2014 1124   NITRITE POSITIVE* 09/25/2015 1828   LEUKOCYTESUR MODERATE* 09/25/2015 1828     ) Recent Results (from the past 240 hour(s))  Urine culture     Status: Abnormal   Collection Time: 09/25/15  6:27 PM  Result Value Ref Range Status   Specimen Description URINE, CLEAN CATCH  Final   Special Requests NONE  Final   Culture >=100,000 COLONIES/mL PSEUDOMONAS AERUGINOSA (A)  Final   Report Status 09/28/2015 FINAL  Final   Organism ID, Bacteria PSEUDOMONAS AERUGINOSA (A)  Final      Susceptibility   Pseudomonas aeruginosa - MIC*    CEFTAZIDIME 4 SENSITIVE Sensitive     CIPROFLOXACIN <=0.25 SENSITIVE Sensitive     GENTAMICIN 2 SENSITIVE Sensitive     IMIPENEM 2 SENSITIVE Sensitive     PIP/TAZO 16 SENSITIVE Sensitive     CEFEPIME 4 SENSITIVE Sensitive     * >=100,000 COLONIES/mL PSEUDOMONAS AERUGINOSA  Culture, blood (Routine x 2)     Status: Abnormal (Preliminary result)   Collection Time: 09/25/15  7:55 PM  Result Value  Ref Range Status   Specimen Description BLOOD LEFT ANTECUBITAL  Final   Special Requests BOTTLES DRAWN AEROBIC AND ANAEROBIC 5CC   Final   Culture  Setup Time   Final    GRAM POSITIVE COCCI IN CLUSTERS AEROBIC BOTTLE ONLY CRITICAL RESULT CALLED TO, READ BACK BY AND VERIFIED WITH: C. STEWART, PHARMD AT 0850 ON 09/28/15 BY C. JESSUP, MLT.    Culture STAPHYLOCOCCUS SPECIES (COAGULASE NEGATIVE) (A)  Final   Report Status PENDING  Incomplete  Blood Culture ID Panel (Reflexed)     Status: None   Collection Time: 09/25/15  7:55 PM  Result Value Ref Range Status   Enterococcus species NOT DETECTED NOT DETECTED Final   Vancomycin resistance NOT DETECTED NOT DETECTED Final  Listeria monocytogenes NOT DETECTED NOT DETECTED Final   Staphylococcus species NOT DETECTED NOT DETECTED Final   Staphylococcus aureus NOT DETECTED NOT DETECTED Final   Methicillin resistance NOT DETECTED NOT DETECTED Final   Streptococcus species NOT DETECTED NOT DETECTED Final   Streptococcus agalactiae NOT DETECTED NOT DETECTED Final   Streptococcus pneumoniae NOT DETECTED NOT DETECTED Final   Streptococcus pyogenes NOT DETECTED NOT DETECTED Final   Acinetobacter baumannii NOT DETECTED NOT DETECTED Final   Enterobacteriaceae species NOT DETECTED NOT DETECTED Final   Enterobacter cloacae complex NOT DETECTED NOT DETECTED Final   Escherichia coli NOT DETECTED NOT DETECTED Final   Klebsiella oxytoca NOT DETECTED NOT DETECTED Final   Klebsiella pneumoniae NOT DETECTED NOT DETECTED Final   Proteus species NOT DETECTED NOT DETECTED Final   Serratia marcescens NOT DETECTED NOT DETECTED Final   Carbapenem resistance NOT DETECTED NOT DETECTED Final   Haemophilus influenzae NOT DETECTED NOT DETECTED Final   Neisseria meningitidis NOT DETECTED NOT DETECTED Final   Pseudomonas aeruginosa NOT DETECTED NOT DETECTED Final   Candida albicans NOT DETECTED NOT DETECTED Final   Candida glabrata NOT DETECTED NOT DETECTED Final    Candida krusei NOT DETECTED NOT DETECTED Final   Candida parapsilosis NOT DETECTED NOT DETECTED Final   Candida tropicalis NOT DETECTED NOT DETECTED Final  Culture, blood (Routine x 2)     Status: None (Preliminary result)   Collection Time: 09/25/15  9:20 PM  Result Value Ref Range Status   Specimen Description BLOOD RIGHT ANTECUBITAL  Final   Special Requests BOTTLES DRAWN AEROBIC AND ANAEROBIC 5CC  Final   Culture NO GROWTH 4 DAYS  Final   Report Status PENDING  Incomplete      Anti-infectives    Start     Dose/Rate Route Frequency Ordered Stop   09/26/15 1800  cefTRIAXone (ROCEPHIN) 2 g in dextrose 5 % 50 mL IVPB  Status:  Discontinued     2 g 100 mL/hr over 30 Minutes Intravenous Every 24 hours 09/25/15 2148 09/27/15 1515   09/25/15 2300  levofloxacin (LEVAQUIN) IVPB 500 mg     500 mg 100 mL/hr over 60 Minutes Intravenous Daily at bedtime 09/25/15 2137     09/25/15 2000  cefTRIAXone (ROCEPHIN) 1 g in dextrose 5 % 50 mL IVPB     1 g 100 mL/hr over 30 Minutes Intravenous  Once 09/25/15 1957 09/25/15 2138       Radiology Studies: No results found.      Scheduled Meds: . enoxaparin (LOVENOX) injection  40 mg Subcutaneous QHS  . feeding supplement (ENSURE ENLIVE)  237 mL Oral BID BM  . levofloxacin (LEVAQUIN) IV  500 mg Intravenous QHS  . tamsulosin  0.4 mg Oral Daily   Continuous Infusions: . sodium chloride 75 mL/hr at 09/29/15 1040     LOS: 4 days    Time spent: 25 min    Alleyne Lac, MD [facility] Pager [phone]  If 7PM-7AM, please contact night-coverage [email or web address] Password [email or web address] 09/29/2015, 7:44 PM

## 2015-09-29 NOTE — Progress Notes (Signed)
Pt asked to have his temp checked, reading of 99.3 recorded this evening.

## 2015-09-30 LAB — CBC
HEMATOCRIT: 36.8 % — AB (ref 39.0–52.0)
Hemoglobin: 12.4 g/dL — ABNORMAL LOW (ref 13.0–17.0)
MCH: 29.7 pg (ref 26.0–34.0)
MCHC: 33.7 g/dL (ref 30.0–36.0)
MCV: 88 fL (ref 78.0–100.0)
Platelets: 184 10*3/uL (ref 150–400)
RBC: 4.18 MIL/uL — ABNORMAL LOW (ref 4.22–5.81)
RDW: 13 % (ref 11.5–15.5)
WBC: 19.1 10*3/uL — ABNORMAL HIGH (ref 4.0–10.5)

## 2015-09-30 LAB — CULTURE, BLOOD (ROUTINE X 2): CULTURE: NO GROWTH

## 2015-09-30 MED ORDER — HYDROCODONE-ACETAMINOPHEN 10-325 MG PO TABS: 1.0000 | ORAL_TABLET | Freq: Four times a day (QID) | ORAL | Status: DC | PRN

## 2015-09-30 MED ORDER — LEVOFLOXACIN 500 MG PO TABS: 500.0000 mg | ORAL_TABLET | Freq: Every day | ORAL | Status: DC

## 2015-09-30 NOTE — Care Management Important Message (Signed)
Important Message  Patient Details  Name: Gordon Lucas MRN: [id number] Date of Birth: [birth year]   Medicare Important Message Given:  Yes    Bernadette HoitShoffner, Faizah Kandler Coleman 09/30/2015, 8:33 AM

## 2015-09-30 NOTE — Progress Notes (Signed)
Patient discharged to home with instructions. 

## 2015-09-30 NOTE — Progress Notes (Signed)
Nutrition Brief Note  Patient identified on the Malnutrition Screening Tool (MST) Report.  Pt has had a 4% weight loss since May 2017 >> not significant for time frame.  Wt Readings from Last 15 Encounters:  09/25/15 165 lb 8 oz (75.07 kg)  07/13/15 172 lb (78.019 kg)  04/18/15 175 lb (79.379 kg)  03/04/15 179 lb (81.194 kg)  02/17/15 183 lb 4 oz (83.122 kg)  02/11/15 189 lb 6 oz (85.9 kg)  02/08/15 186 lb 9 oz (84.624 kg)  01/04/15 184 lb (83.462 kg)  10/08/14 195 lb (88.451 kg)  07/15/14 199 lb (90.266 kg)  04/16/14 196 lb (88.905 kg)  01/14/14 198 lb (89.812 kg)  10/14/13 201 lb (91.173 kg)  07/13/13 194 lb 6 oz (88.168 kg)  04/14/13 193 lb (87.544 kg)    Body mass index is 25.91 kg/(m^2). Patient meets criteria for Overweight based on current BMI.   Current diet order is Regular, patient is consuming approximately 50% of meals at this time. Labs and medications reviewed.   No nutrition interventions warranted at this time. If nutrition issues arise, please consult RD.   Maureen ChattersKatie Lamel Mccarley, RD, LDN Pager #: [phone] After-Hours Pager #: [phone]

## 2015-10-03 NOTE — Discharge Summary (Addendum)
Physician Discharge Summary  Gordon Lucas:[id number] DOB: Mar 28, 1950 DOA: 09/25/2015  PCP: Sonda Primes, MD  Admit date: 09/25/2015 Discharge date: 09/30/2015  Admitted From: Home Disposition: HOme  Recommendations for Outpatient Follow-up:  1. Follow up with PCP in 1-2 weeks 2. Please obtain BMP/CBC in one week 3. Please follow up with urology in 1 to 2 weeks.     Discharge Condition:stable.  CODE STATUS:full code.  Diet recommendation: Heart Healthy  Brief/Interim Summary: Patient is a 65 yo male with history of BPH who came with cc of left testicular pain and swelling that started yesterday along with a fever up to 102 associated with dysuria, dizziness and nausea. No recent UTI, no trauma, no penile discharge, no vomiting, no hematuria. He also has mild lower abdominal pain. He was admitted for orchitis .   Discharge Diagnoses:  Active Problems:   Orchitis   Sepsis (HCC)   Hypokalemia Sepsis due to acute Orchitis / UTI;  Started on ceft and levo daily- culture showing pseudomonas--TRANSITIONED TO levofloxacin. Urology consulted and recommended RTC follow up in 3 weeks. Continue with antibiotics to avoid surgical intervention.  [place] confirms decreased blood flow to left testicle but no torsion.  Scrotum elevation  Pain control with oxy initially, pt reported pain control better with norco.  Discharged on oral antibiotics to complete the course.   BPH; cont home meds  Hypokalemia Repleted. And repeat is normal.   leukocytosis -possibly from orchitis, improving. Monitor intermittently. Repeat CBC in am shows improvement. .   Asthma:  No wheezing heard.  started on alb as needed.   Acute kidney injury; resolved with hydration,   Lactic acidosis -resolved with IVf   Discharge Instructions  Discharge Instructions    Discharge instructions    Complete by:  As directed   Please follow up with Urology as recommended.       Medication List     STOP taking these medications   amLODipine 5 MG tablet Commonly known as:  NORVASC   celecoxib 200 MG capsule Commonly known as:  CELEBREX   cyclobenzaprine 10 MG tablet Commonly known as:  FLEXERIL   UROGESIC-BLUE 81.6 MG Tabs     TAKE these medications   acetaminophen 500 MG tablet Commonly known as:  TYLENOL Take 1,500 mg by mouth every 6 (six) hours as needed (pain).   HYDROcodone-acetaminophen 10-325 MG tablet Commonly known as:  NORCO Take 1 tablet by mouth every 6 (six) hours as needed for severe pain. Please fill on or after 10/15/15   levofloxacin 500 MG tablet Commonly known as:  LEVAQUIN Take 1 tablet (500 mg total) by mouth daily.   polyethylene glycol powder powder Commonly known as:  GLYCOLAX/MIRALAX MIX 17 GRAMS IN LIQUID AND DRINK DAILY AS NEEDED What changed:  how much to take  how to take this  when to take this  reasons to take this  additional instructions   tamsulosin 0.4 MG Caps capsule Commonly known as:  FLOMAX Take 1 capsule (0.4 mg total) by mouth daily.      Follow-up Information    Sonda Primes, MD. Schedule an appointment as soon as possible for a visit in 1 week.   Specialty:  Internal Medicine Contact information: [address] [place] [address] [phone]        Kathi Ludwig, MD. Schedule an appointment as soon as possible for a visit in 1 week.   Specialty:  Urology Contact information: [address] [place] [address]  [phone]          No Known Allergies  Consultations: Urology.  Procedures/Studies: Dg Chest 2 View  Result Date: 09/25/2015 CLINICAL DATA:  Patient with pain and swelling to the left testicle. Possible sepsis. EXAM: CHEST  2 VIEW COMPARISON:  Chest radiograph 02/08/2015. FINDINGS: Normal cardiac and mediastinal contours. No consolidative pulmonary opacities. No pleural effusion or pneumothorax. Mid thoracic spine degenerative changes. IMPRESSION: No active cardiopulmonary  disease. Electronically Signed   By: Annia Belt M.D.   On: 09/25/2015 19:00   US Scrotum  Result Date: 09/25/2015 CLINICAL DATA:  Left testicular pain. EXAM: SCROTAL ULTRASOUND DOPPLER ULTRASOUND OF THE TESTICLES TECHNIQUE: Complete ultrasound examination of the testicles, epididymis, and other scrotal structures was performed. Color and spectral Doppler ultrasound were also utilized to evaluate blood flow to the testicles. COMPARISON:  None. FINDINGS: Right testicle Measurements: 3.0 x 2.1 x 1.9 cm. No mass or microlithiasis visualized. Left testicle Measurements: 4.1 x 3.3 x 3.2 cm. The left testicles enlarged compared to the right with no masses. Right epididymis:  Normal in size and appearance. Left epididymis: Increased blood flow seen in the left epididymis compared to the right with no mass. Hydrocele:  There is a complex hydrocele on the left. Varicocele:  None visualized. Pulsed Doppler interrogation of both testes demonstrates normal blood flow on the right. There is decreased arterial an venous blood flow on the left compared to the right. There is increased blood flow within the left epididymis. IMPRESSION: 1. The left testicle is enlarged with decreased arterial and venous blood flow compared to the right. Partial or intermittent torsion is not excluded based on imaging. There is also increased blood flow in the left epididymis which could be seen in the setting of epididymitis. There is a complex hydrocele on the left. Findings discussed with Dr. Jacquelyne Balint Electronically Signed   By: Gerome Sam III M.D   On: 09/25/2015 21:13   [place] Art/ven Flow Abd Pelv Doppler  Result Date: 09/25/2015 CLINICAL DATA:  Left testicular pain. EXAM: SCROTAL ULTRASOUND DOPPLER ULTRASOUND OF THE TESTICLES TECHNIQUE: Complete ultrasound examination of the testicles, epididymis, and other scrotal structures was performed. Color and spectral Doppler ultrasound were also utilized to evaluate blood flow to the  testicles. COMPARISON:  None. FINDINGS: Right testicle Measurements: 3.0 x 2.1 x 1.9 cm. No mass or microlithiasis visualized. Left testicle Measurements: 4.1 x 3.3 x 3.2 cm. The left testicles enlarged compared to the right with no masses. Right epididymis:  Normal in size and appearance. Left epididymis: Increased blood flow seen in the left epididymis compared to the right with no mass. Hydrocele:  There is a complex hydrocele on the left. Varicocele:  None visualized. Pulsed Doppler interrogation of both testes demonstrates normal blood flow on the right. There is decreased arterial an venous blood flow on the left compared to the right. There is increased blood flow within the left epididymis. IMPRESSION: 1. The left testicle is enlarged with decreased arterial and venous blood flow compared to the right. Partial or intermittent torsion is not excluded based on imaging. There is also increased blood flow in the left epididymis which could be seen in the setting of epididymitis. There is a complex hydrocele on the left. Findings discussed with Dr. Jacquelyne Balint Electronically Signed   By: Gerome Sam III M.D   On: 09/25/2015 21:13       Subjective: No new complaints.   Discharge Exam: Vitals:   09/29/15 2100 09/30/15 0651  BP: 136/77 127/63  Pulse: 89 94  Resp: 18 19  Temp: 99.9 F (37.7 C) 98.4 F (36.9 C)   Vitals:   09/29/15 1436 09/29/15 1735 09/29/15 2100 09/30/15 0651  BP: (!) 143/73  136/77 127/63  Pulse: 92  89 94  Resp: 18  18 19   Temp: 99.7 F (37.6 C) 99.3 F (37.4 C) 99.9 F (37.7 C) 98.4 F (36.9 C)  TempSrc: Oral Oral Oral Oral  SpO2: 99%  98% 94%  Weight:      Height:        General: Pt is alert, awake, not in acute distress Cardiovascular: RRR, S1/S2 +, no rubs, no gallops Respiratory: CTA bilaterally, no wheezing, no rhonchi Abdominal: Soft, NT, ND, bowel sounds + Extremities: no edema, no cyanosis    The results of significant diagnostics from this  hospitalization (including imaging, microbiology, ancillary and laboratory) are listed below for reference.     Microbiology: Recent Results (from the past 240 hour(s))  Urine culture     Status: Abnormal   Collection Time: 09/25/15  6:27 PM  Result Value Ref Range Status   Specimen Description URINE, CLEAN CATCH  Final   Special Requests NONE  Final   Culture >=100,000 COLONIES/mL PSEUDOMONAS AERUGINOSA (A)  Final   Report Status 09/28/2015 FINAL  Final   Organism ID, Bacteria PSEUDOMONAS AERUGINOSA (A)  Final      Susceptibility   Pseudomonas aeruginosa - MIC*    CEFTAZIDIME 4 SENSITIVE Sensitive     CIPROFLOXACIN <=0.25 SENSITIVE Sensitive     GENTAMICIN 2 SENSITIVE Sensitive     IMIPENEM 2 SENSITIVE Sensitive     PIP/TAZO 16 SENSITIVE Sensitive     CEFEPIME 4 SENSITIVE Sensitive     * >=100,000 COLONIES/mL PSEUDOMONAS AERUGINOSA  Culture, blood (Routine x 2)     Status: Abnormal   Collection Time: 09/25/15  7:55 PM  Result Value Ref Range Status   Specimen Description BLOOD LEFT ANTECUBITAL  Final   Special Requests BOTTLES DRAWN AEROBIC AND ANAEROBIC 5CC   Final   Culture  Setup Time   Final    GRAM POSITIVE COCCI IN CLUSTERS AEROBIC BOTTLE ONLY CRITICAL RESULT CALLED TO, READ BACK BY AND VERIFIED WITH: C. STEWART, PHARMD AT 0850 ON 09/28/15 BY C. JESSUP, MLT.    Culture (A)  Final    STAPHYLOCOCCUS SPECIES (COAGULASE NEGATIVE) THE SIGNIFICANCE OF ISOLATING THIS ORGANISM FROM A SINGLE SET OF BLOOD CULTURES WHEN MULTIPLE SETS ARE DRAWN IS UNCERTAIN. PLEASE NOTIFY THE MICROBIOLOGY DEPARTMENT WITHIN ONE WEEK IF SPECIATION AND SENSITIVITIES ARE REQUIRED.    Report Status 09/30/2015 FINAL  Final  Blood Culture ID Panel (Reflexed)     Status: None   Collection Time: 09/25/15  7:55 PM  Result Value Ref Range Status   Enterococcus species NOT DETECTED NOT DETECTED Final   Vancomycin resistance NOT DETECTED NOT DETECTED Final   Listeria monocytogenes NOT DETECTED NOT DETECTED  Final   Staphylococcus species NOT DETECTED NOT DETECTED Final   Staphylococcus aureus NOT DETECTED NOT DETECTED Final   Methicillin resistance NOT DETECTED NOT DETECTED Final   Streptococcus species NOT DETECTED NOT DETECTED Final   Streptococcus agalactiae NOT DETECTED NOT DETECTED Final   Streptococcus pneumoniae NOT DETECTED NOT DETECTED Final   Streptococcus pyogenes NOT DETECTED NOT DETECTED Final   Acinetobacter baumannii NOT DETECTED NOT DETECTED Final   Enterobacteriaceae species NOT DETECTED NOT DETECTED Final   Enterobacter cloacae complex NOT DETECTED NOT DETECTED Final   Escherichia coli NOT DETECTED NOT DETECTED Final  Klebsiella oxytoca NOT DETECTED NOT DETECTED Final   Klebsiella pneumoniae NOT DETECTED NOT DETECTED Final   Proteus species NOT DETECTED NOT DETECTED Final   Serratia marcescens NOT DETECTED NOT DETECTED Final   Carbapenem resistance NOT DETECTED NOT DETECTED Final   Haemophilus influenzae NOT DETECTED NOT DETECTED Final   Neisseria meningitidis NOT DETECTED NOT DETECTED Final   Pseudomonas aeruginosa NOT DETECTED NOT DETECTED Final   Candida albicans NOT DETECTED NOT DETECTED Final   Candida glabrata NOT DETECTED NOT DETECTED Final   Candida krusei NOT DETECTED NOT DETECTED Final   Candida parapsilosis NOT DETECTED NOT DETECTED Final   Candida tropicalis NOT DETECTED NOT DETECTED Final  Culture, blood (Routine x 2)     Status: None   Collection Time: 09/25/15  9:20 PM  Result Value Ref Range Status   Specimen Description BLOOD RIGHT ANTECUBITAL  Final   Special Requests BOTTLES DRAWN AEROBIC AND ANAEROBIC 5CC  Final   Culture NO GROWTH 5 DAYS  Final   Report Status 09/30/2015 FINAL  Final     Labs: BNP (last 3 results) No results for input(s): BNP in the last 8760 hours. Basic Metabolic Panel:  Recent Labs Lab 09/27/15 0316 09/28/15 0204  NA 141 137  K 3.0* 3.7  CL 111 109  CO2 22 21*  GLUCOSE 88 94  BUN 11 7  CREATININE 0.92 0.82   CALCIUM 8.4* 8.6*   Liver Function Tests: No results for input(s): AST, ALT, ALKPHOS, BILITOT, PROT, ALBUMIN in the last 168 hours. No results for input(s): LIPASE, AMYLASE in the last 168 hours. No results for input(s): AMMONIA in the last 168 hours. CBC:  Recent Labs Lab 09/27/15 0316 09/28/15 0204 09/30/15 0200  WBC 46.0* 44.0* 19.1*  NEUTROABS  --  39.2*  --   HGB 12.9* 12.7* 12.4*  HCT 38.6* 38.0* 36.8*  MCV 89.6 89.4 88.0  PLT 163 163 184   Cardiac Enzymes: No results for input(s): CKTOTAL, CKMB, CKMBINDEX, TROPONINI in the last 168 hours. BNP: Invalid input(s): POCBNP CBG: No results for input(s): GLUCAP in the last 168 hours. D-Dimer No results for input(s): DDIMER in the last 72 hours. Hgb A1c No results for input(s): HGBA1C in the last 72 hours. Lipid Profile No results for input(s): CHOL, HDL, LDLCALC, TRIG, CHOLHDL, LDLDIRECT in the last 72 hours. Thyroid function studies No results for input(s): TSH, T4TOTAL, T3FREE, THYROIDAB in the last 72 hours.  Invalid input(s): FREET3 Anemia work up No results for input(s): VITAMINB12, FOLATE, FERRITIN, TIBC, IRON, RETICCTPCT in the last 72 hours. Urinalysis    Component Value Date/Time   COLORURINE AMBER (A) 09/25/2015 1828   APPEARANCEUR CLOUDY (A) 09/25/2015 1828   LABSPEC 1.021 09/25/2015 1828   PHURINE 6.0 09/25/2015 1828   GLUCOSEU NEGATIVE 09/25/2015 1828   GLUCOSEU NEGATIVE 10/04/2014 1124   HGBUR SMALL (A) 09/25/2015 1828   BILIRUBINUR NEGATIVE 09/25/2015 1828   KETONESUR NEGATIVE 09/25/2015 1828   PROTEINUR NEGATIVE 09/25/2015 1828   UROBILINOGEN 0.2 10/04/2014 1124   NITRITE POSITIVE (A) 09/25/2015 1828   LEUKOCYTESUR MODERATE (A) 09/25/2015 1828   Sepsis Labs Invalid input(s): PROCALCITONIN,  WBC,  LACTICIDVEN Microbiology Recent Results (from the past 240 hour(s))  Urine culture     Status: Abnormal   Collection Time: 09/25/15  6:27 PM  Result Value Ref Range Status   Specimen Description  URINE, CLEAN CATCH  Final   Special Requests NONE  Final   Culture >=100,000 COLONIES/mL PSEUDOMONAS AERUGINOSA (A)  Final   Report  Status 09/28/2015 FINAL  Final   Organism ID, Bacteria PSEUDOMONAS AERUGINOSA (A)  Final      Susceptibility   Pseudomonas aeruginosa - MIC*    CEFTAZIDIME 4 SENSITIVE Sensitive     CIPROFLOXACIN <=0.25 SENSITIVE Sensitive     GENTAMICIN 2 SENSITIVE Sensitive     IMIPENEM 2 SENSITIVE Sensitive     PIP/TAZO 16 SENSITIVE Sensitive     CEFEPIME 4 SENSITIVE Sensitive     * >=100,000 COLONIES/mL PSEUDOMONAS AERUGINOSA  Culture, blood (Routine x 2)     Status: Abnormal   Collection Time: 09/25/15  7:55 PM  Result Value Ref Range Status   Specimen Description BLOOD LEFT ANTECUBITAL  Final   Special Requests BOTTLES DRAWN AEROBIC AND ANAEROBIC 5CC   Final   Culture  Setup Time   Final    GRAM POSITIVE COCCI IN CLUSTERS AEROBIC BOTTLE ONLY CRITICAL RESULT CALLED TO, READ BACK BY AND VERIFIED WITH: C. STEWART, PHARMD AT 0850 ON 09/28/15 BY C. JESSUP, MLT.    Culture (A)  Final    STAPHYLOCOCCUS SPECIES (COAGULASE NEGATIVE) THE SIGNIFICANCE OF ISOLATING THIS ORGANISM FROM A SINGLE SET OF BLOOD CULTURES WHEN MULTIPLE SETS ARE DRAWN IS UNCERTAIN. PLEASE NOTIFY THE MICROBIOLOGY DEPARTMENT WITHIN ONE WEEK IF SPECIATION AND SENSITIVITIES ARE REQUIRED.    Report Status 09/30/2015 FINAL  Final  Blood Culture ID Panel (Reflexed)     Status: None   Collection Time: 09/25/15  7:55 PM  Result Value Ref Range Status   Enterococcus species NOT DETECTED NOT DETECTED Final   Vancomycin resistance NOT DETECTED NOT DETECTED Final   Listeria monocytogenes NOT DETECTED NOT DETECTED Final   Staphylococcus species NOT DETECTED NOT DETECTED Final   Staphylococcus aureus NOT DETECTED NOT DETECTED Final   Methicillin resistance NOT DETECTED NOT DETECTED Final   Streptococcus species NOT DETECTED NOT DETECTED Final   Streptococcus agalactiae NOT DETECTED NOT DETECTED Final    Streptococcus pneumoniae NOT DETECTED NOT DETECTED Final   Streptococcus pyogenes NOT DETECTED NOT DETECTED Final   Acinetobacter baumannii NOT DETECTED NOT DETECTED Final   Enterobacteriaceae species NOT DETECTED NOT DETECTED Final   Enterobacter cloacae complex NOT DETECTED NOT DETECTED Final   Escherichia coli NOT DETECTED NOT DETECTED Final   Klebsiella oxytoca NOT DETECTED NOT DETECTED Final   Klebsiella pneumoniae NOT DETECTED NOT DETECTED Final   Proteus species NOT DETECTED NOT DETECTED Final   Serratia marcescens NOT DETECTED NOT DETECTED Final   Carbapenem resistance NOT DETECTED NOT DETECTED Final   Haemophilus influenzae NOT DETECTED NOT DETECTED Final   Neisseria meningitidis NOT DETECTED NOT DETECTED Final   Pseudomonas aeruginosa NOT DETECTED NOT DETECTED Final   Candida albicans NOT DETECTED NOT DETECTED Final   Candida glabrata NOT DETECTED NOT DETECTED Final   Candida krusei NOT DETECTED NOT DETECTED Final   Candida parapsilosis NOT DETECTED NOT DETECTED Final   Candida tropicalis NOT DETECTED NOT DETECTED Final  Culture, blood (Routine x 2)     Status: None   Collection Time: 09/25/15  9:20 PM  Result Value Ref Range Status   Specimen Description BLOOD RIGHT ANTECUBITAL  Final   Special Requests BOTTLES DRAWN AEROBIC AND ANAEROBIC 5CC  Final   Culture NO GROWTH 5 DAYS  Final   Report Status 09/30/2015 FINAL  Final     Time coordinating discharge: Over 30 minutes  SIGNED:   Kathlen Mody, MD  [facility] 10/03/2015, 7:06 AM Pager   If 7PM-7AM, please contact night-coverage [email or web address] Password [email or web address]

## 2015-10-05 ENCOUNTER — Other Ambulatory Visit (INDEPENDENT_AMBULATORY_CARE_PROVIDER_SITE_OTHER): Payer: Medicare Other

## 2015-10-05 ENCOUNTER — Encounter: Payer: Self-pay | Admitting: Family

## 2015-10-05 ENCOUNTER — Ambulatory Visit (INDEPENDENT_AMBULATORY_CARE_PROVIDER_SITE_OTHER): Payer: Medicare Other | Admitting: Family

## 2015-10-05 VITALS — BP 130/70 | HR 93 | Temp 98.6°F | Resp 18 | Ht 67.0 in | Wt 161.0 lb

## 2015-10-05 DIAGNOSIS — N452 Orchitis: Secondary | ICD-10-CM

## 2015-10-05 DIAGNOSIS — E876 Hypokalemia: Secondary | ICD-10-CM

## 2015-10-05 LAB — BASIC METABOLIC PANEL
BUN: 12 mg/dL (ref 6–23)
CALCIUM: 9.7 mg/dL (ref 8.4–10.5)
CO2: 30 mEq/L (ref 19–32)
Chloride: 102 mEq/L (ref 96–112)
Creatinine, Ser: 0.86 mg/dL (ref 0.40–1.50)
GFR: 115.02 mL/min (ref >60.00)
GLUCOSE: 100 mg/dL — AB (ref 70–99)
Potassium: 3.9 mEq/L (ref 3.5–5.1)
Sodium: 139 mEq/L (ref 135–145)

## 2015-10-05 LAB — CBC
HEMATOCRIT: 39.7 % (ref 39.0–52.0)
HEMOGLOBIN: 13 g/dL (ref 13.0–17.0)
MCHC: 32.8 g/dL (ref 30.0–36.0)
MCV: 90.8 fl (ref 78.0–100.0)
PLATELETS: 347 10*3/uL (ref 150.0–400.0)
RBC: 4.37 Mil/uL (ref 4.22–5.81)
RDW: 13.9 % (ref 11.5–15.5)
WBC: 17.4 10*3/uL — ABNORMAL HIGH (ref 4.0–10.5)

## 2015-10-05 NOTE — Assessment & Plan Note (Signed)
Hypokalemia repleated in the hospital. Check BMET.

## 2015-10-05 NOTE — Patient Instructions (Signed)
Thank you for choosing [facility].  Summary/Instructions:  Please continue to take your medications as prescribed.   Continue to move around as tolerated.  Continue warm baths and soaks as needed.   Follow up with urology and Dr. Posey Rea as scheduled.   If your symptoms worsen or fail to improve, please contact our office for further instruction, or in case of emergency go directly to the emergency room at the closest medical facility.

## 2015-10-05 NOTE — Assessment & Plan Note (Signed)
Continues to experience testicular pain that waxes and wanes. Seen by urology with instructions to continue current treatments. Obtain BMET and CBC for follow up. Continue current pain medication as prescribed by urology. Encouraged to keep moving as tolerated. Follow up with urology as scheduled.

## 2015-10-05 NOTE — Progress Notes (Signed)
Subjective:    Patient ID: Gordon Lucas, male    DOB: 07/07/1950, 65 y.o.   MRN: [id number]  Chief Complaint  Patient presents with  . Hospitalization Follow-up    states that he does not feel better since hospital visit, still having the same sxs with no improvement    HPI:  Gordon Lucas is a 66 y.o. male who  has a past medical history of Arthritis; Asthma; BPH (benign prostatic hypertrophy); COPD (chronic obstructive pulmonary disease) (HCC); ED (erectile dysfunction); Imbalance; Kidney stones; Low back pain; Microscopic hematuria; Orchitis, left (hospitalized 09/25/2015); Osteoporosis; and Urinary hesitancy. and presents today for a hospitalization follow up.   Recently evaluated in the hospital for left testicular pain and swelling that started 1 day prior to arrival. He was noted to have a fever 102 with dysuria, dizziness, and nausea. She was noted to have a urinary tract infection and orchitis. Urology was consult and recommended return to clinic after discharge. Recommended to continue antibiotics to avoid surgical intervention. Ultrasound of the scrotum confirmed decreased blood flow with no torsion. He was discharged home on oral antibiotics instructed follow-up with primary care for a basic metabolic profile and CBC. Follow-up with urology in 1-2 weeks. All hospital records, imaging, and lab work was reviewed in detail.  Continues to take the antibiotics as prescribed and denies adverse side effects. Notes that his pain has decreased some but continues to experience some pain with increased movement. Pain is described as achy when it occurs and is generally relieved when sitting. Endorses occasional fever with last fever was about 24 hours ago with a T-max of 99. Non-pharmacological treatment includes warm bath. Has seen urology and instructued to continue on the current treatment plan. Does have some difficulties standing up for periods of time secondary to the pain.  Able to complete activities of daily living.   No Known Allergies   Current Outpatient Prescriptions on File Prior to Visit  Medication Sig Dispense Refill  . acetaminophen (TYLENOL) 500 MG tablet Take 1,500 mg by mouth every 6 (six) hours as needed (pain).    Marland Kitchen HYDROcodone-acetaminophen (NORCO) 10-325 MG tablet Take 1 tablet by mouth every 6 (six) hours as needed for severe pain. Please fill on or after 10/15/15 10 tablet 0  . levofloxacin (LEVAQUIN) 500 MG tablet Take 1 tablet (500 mg total) by mouth daily. 7 tablet 0  . polyethylene glycol powder (GLYCOLAX/MIRALAX) powder MIX 17 GRAMS IN LIQUID AND DRINK DAILY AS NEEDED (Patient taking differently: Take 17 g by mouth daily as needed (constipation). Mix in 8 oz liquid and drink) 527 g 3  . tamsulosin (FLOMAX) 0.4 MG CAPS capsule Take 1 capsule (0.4 mg total) by mouth daily. 90 capsule 3   No current facility-administered medications on file prior to visit.      Past Surgical History:  Procedure Laterality Date  . CYSTOSCOPY WITH RETROGRADE PYELOGRAM, URETEROSCOPY AND STENT PLACEMENT  02/11/2015  . HOLMIUM LASER APPLICATION N/A 02/11/2015   Procedure: HOLMIUM LASER APPLICATION;  Surgeon: Jethro Bolus, MD;  Location: [facility];  Service: Urology;  Laterality: N/A;  . 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 Arthritis   .  Asthma   . BPH (benign prostatic hypertrophy)   . COPD (chronic obstructive pulmonary disease) (HCC)   . ED (erectile dysfunction)   . Imbalance   . Kidney stones   . Low back pain   . Microscopic hematuria    s/p Urol w/up  . Orchitis, left  hospitalized 09/25/2015  . Osteoporosis   . Urinary hesitancy       Review of Systems  Constitutional: Positive for fever.  Genitourinary: Positive for testicular pain. Negative for discharge, dysuria, frequency, hematuria, penile pain, penile swelling and urgency.      Objective:    BP 130/70 (BP Location: Left Arm, Patient Position: Sitting, Cuff Size: Normal)   Pulse 93   Temp 98.6 F (37 C) (Oral)   Resp 18   Ht 5\' 7"  (1.702 m)   Wt 161 lb (73 kg)   SpO2 95%   BMI 25.22 kg/m  Nursing note and vital signs reviewed.  Physical Exam  Constitutional: He is oriented to person, place, and time. He appears well-developed and well-nourished. No distress.  Cardiovascular: Normal rate, regular rhythm, normal heart sounds and intact distal pulses.   Pulmonary/Chest: Effort normal and breath sounds normal.  Neurological: He is alert and oriented to person, place, and time.  Skin: Skin is warm and dry.  Psychiatric: He has a normal mood and affect. His behavior is normal. Judgment and thought content normal.       Assessment & Plan:   Problem List Items Addressed This Visit      Genitourinary   Orchitis - Primary    Continues to experience testicular pain that waxes and wanes. Seen by urology with instructions to continue current treatments. Obtain BMET and CBC for follow up. Continue current pain medication as prescribed by urology. Encouraged to keep moving as tolerated. Follow up with urology as scheduled.       Relevant Orders   CBC (Completed)   Basic Metabolic Panel (BMET) (Completed)     Other   Hypokalemia    Hypokalemia repleated in the hospital. Check BMET.       Relevant Orders   CBC (Completed)   Basic Metabolic Panel (BMET) (Completed)    Other Visit Diagnoses   None.      I am having Mr. Faler maintain his polyethylene glycol powder, tamsulosin, acetaminophen, HYDROcodone-acetaminophen, and levofloxacin.   Follow-up: Return if symptoms worsen  or fail to improve.  Jeanine Luz, FNP

## 2015-10-11 ENCOUNTER — Encounter: Payer: Self-pay | Admitting: Internal Medicine

## 2015-10-11 ENCOUNTER — Ambulatory Visit (INDEPENDENT_AMBULATORY_CARE_PROVIDER_SITE_OTHER): Payer: Medicare Other | Admitting: Internal Medicine

## 2015-10-11 DIAGNOSIS — R972 Elevated prostate specific antigen [PSA]: Secondary | ICD-10-CM

## 2015-10-11 DIAGNOSIS — R030 Elevated blood-pressure reading, without diagnosis of hypertension: Secondary | ICD-10-CM

## 2015-10-11 DIAGNOSIS — N452 Orchitis: Secondary | ICD-10-CM

## 2015-10-11 DIAGNOSIS — R635 Abnormal weight gain: Secondary | ICD-10-CM

## 2015-10-11 DIAGNOSIS — M545 Low back pain, unspecified: Secondary | ICD-10-CM

## 2015-10-11 DIAGNOSIS — M5440 Lumbago with sciatica, unspecified side: Secondary | ICD-10-CM

## 2015-10-11 DIAGNOSIS — N200 Calculus of kidney: Secondary | ICD-10-CM

## 2015-10-11 MED ORDER — CELECOXIB 200 MG PO CAPS: 200.0000 mg | ORAL_CAPSULE | Freq: Two times a day (BID) | ORAL | 3 refills | Status: DC | PRN

## 2015-10-11 MED ORDER — POLYETHYLENE GLYCOL 3350 17 GM/SCOOP PO POWD: ORAL | 3 refills | Status: AC

## 2015-10-11 MED ORDER — AMLODIPINE BESYLATE 5 MG PO TABS: 5.0000 mg | ORAL_TABLET | Freq: Every day | ORAL | 11 refills | Status: DC

## 2015-10-11 MED ORDER — HYDROCODONE-ACETAMINOPHEN 10-325 MG PO TABS: 1.0000 | ORAL_TABLET | Freq: Four times a day (QID) | ORAL | 0 refills | Status: DC | PRN

## 2015-10-11 NOTE — Assessment & Plan Note (Signed)
7/17 s/p hosp stay - reviewed F/u w/Dr Patsi Sears - pending L testis is wery firm, large (orange size) but NT - better per pt Finish abx Athletic support underwear

## 2015-10-11 NOTE — Progress Notes (Signed)
Subjective:  Patient ID: Gordon Lucas, male    DOB: [birth year]  Age: 65 y.o. MRN: [id number]  CC: No chief complaint on file.   HPI Gordon Lucas presents for  left testicular pain and swelling that started PTA. He was noted to have a urinary tract infection and orchitis. Urology was consult and recommended return to clinic after discharge. He has been on antibiotics. Ultrasound of the scrotum confirmed decreased blood flow with no torsion. He was discharged home on oral antibiotics instructed follow-up with primary care f and with urology - Dr Patsi Sears. All hospital records, imaging, and lab work was reviewed in detail.  Outpatient Medications Prior to Visit  Medication Sig Dispense Refill  . acetaminophen (TYLENOL) 500 MG tablet Take 1,500 mg by mouth every 6 (six) hours as needed (pain).    Marland Kitchen HYDROcodone-acetaminophen (NORCO) 10-325 MG tablet Take 1 tablet by mouth every 6 (six) hours as needed for severe pain. Please fill on or after 10/15/15 10 tablet 0  . levofloxacin (LEVAQUIN) 500 MG tablet Take 1 tablet (500 mg total) by mouth daily. 7 tablet 0  . polyethylene glycol powder (GLYCOLAX/MIRALAX) powder MIX 17 GRAMS IN LIQUID AND DRINK DAILY AS NEEDED (Patient taking differently: Take 17 g by mouth daily as needed (constipation). Mix in 8 oz liquid and drink) 527 g 3  . tamsulosin (FLOMAX) 0.4 MG CAPS capsule Take 1 capsule (0.4 mg total) by mouth daily. 90 capsule 3   No facility-administered medications prior to visit.     ROS Review of Systems  Constitutional: Negative for appetite change, diaphoresis, fatigue, fever and unexpected weight change.  HENT: Negative for congestion, nosebleeds, sneezing, sore throat and trouble swallowing.   Eyes: Negative for itching and visual disturbance.  Respiratory: Negative for cough.   Cardiovascular: Negative for chest pain, palpitations and leg swelling.  Gastrointestinal: Negative for abdominal distention, blood in stool,  diarrhea and nausea.  Genitourinary: Positive for scrotal swelling and testicular pain. Negative for frequency and hematuria.  Musculoskeletal: Negative for back pain, gait problem, joint swelling and neck pain.  Skin: Positive for color change. Negative for rash.  Neurological: Negative for dizziness, tremors, speech difficulty and weakness.  Psychiatric/Behavioral: Negative for agitation, dysphoric mood and sleep disturbance. The patient is not nervous/anxious.     Objective:  BP 138/78   Pulse 85   Wt 161 lb (73 kg)   SpO2 97%   BMI 25.22 kg/m   BP Readings from Last 3 Encounters:  10/11/15 138/78  10/05/15 130/70  09/30/15 127/63    Wt Readings from Last 3 Encounters:  10/11/15 161 lb (73 kg)  10/05/15 161 lb (73 kg)  09/25/15 165 lb 8 oz (75.1 kg)    Physical Exam  Constitutional: He is oriented to person, place, and time. He appears well-developed. No distress.  NAD  HENT:  Mouth/Throat: Oropharynx is clear and moist.  Eyes: Conjunctivae are normal. Pupils are equal, round, and reactive to light.  Neck: Normal range of motion. No JVD present. No thyromegaly present.  Cardiovascular: Normal rate, regular rhythm, normal heart sounds and intact distal pulses.  Exam reveals no gallop and no friction rub.   No murmur heard. Pulmonary/Chest: Effort normal and breath sounds normal. No respiratory distress. He has no wheezes. He has no rales. He exhibits no tenderness.  Abdominal: Soft. Bowel sounds are normal. He exhibits no distension and no mass. There is no tenderness. There is no rebound and no guarding.  Musculoskeletal: Normal range of  motion. He exhibits no edema or tenderness.  Lymphadenopathy:    He has no cervical adenopathy.  Neurological: He is alert and oriented to person, place, and time. He has normal reflexes. No cranial nerve deficit. He exhibits normal muscle tone. He displays a negative Romberg sign. Coordination and gait normal.  Skin: Skin is warm and  dry. No rash noted.  Psychiatric: He has a normal mood and affect. His behavior is normal. Judgment and thought content normal.  L testis is wery firm, large (orange size) but NT  Lab Results  Component Value Date   WBC 17.4 (H) 10/05/2015   HGB 13.0 10/05/2015   HCT 39.7 10/05/2015   PLT 347.0 10/05/2015   GLUCOSE 100 (H) 10/05/2015   CHOL 182 10/04/2014   TRIG 116.0 10/04/2014   HDL 28.00 (L) 10/04/2014   LDLDIRECT 152.8 09/15/2012   LDLCALC 131 (H) 10/04/2014   ALT 14 (L) 09/26/2015   AST 18 09/26/2015   NA 139 10/05/2015   K 3.9 10/05/2015   CL 102 10/05/2015   CREATININE 0.86 10/05/2015   BUN 12 10/05/2015   CO2 30 10/05/2015   TSH 1.26 10/04/2014   PSA 1.19 10/04/2014   HGBA1C 4.9 10/04/2014    Dg Chest 2 View  Result Date: 09/25/2015 CLINICAL DATA:  Patient with pain and swelling to the left testicle. Possible sepsis. EXAM: CHEST  2 VIEW COMPARISON:  Chest radiograph 02/08/2015. FINDINGS: Normal cardiac and mediastinal contours. No consolidative pulmonary opacities. No pleural effusion or pneumothorax. Mid thoracic spine degenerative changes. IMPRESSION: No active cardiopulmonary disease. Electronically Signed   By: Annia Belt M.D.   On: 09/25/2015 19:00   US Scrotum  Result Date: 09/25/2015 CLINICAL DATA:  Left testicular pain. EXAM: SCROTAL ULTRASOUND DOPPLER ULTRASOUND OF THE TESTICLES TECHNIQUE: Complete ultrasound examination of the testicles, epididymis, and other scrotal structures was performed. Color and spectral Doppler ultrasound were also utilized to evaluate blood flow to the testicles. COMPARISON:  None. FINDINGS: Right testicle Measurements: 3.0 x 2.1 x 1.9 cm. No mass or microlithiasis visualized. Left testicle Measurements: 4.1 x 3.3 x 3.2 cm. The left testicles enlarged compared to the right with no masses. Right epididymis:  Normal in size and appearance. Left epididymis: Increased blood flow seen in the left epididymis compared to the right with no mass.  Hydrocele:  There is a complex hydrocele on the left. Varicocele:  None visualized. Pulsed Doppler interrogation of both testes demonstrates normal blood flow on the right. There is decreased arterial an venous blood flow on the left compared to the right. There is increased blood flow within the left epididymis. IMPRESSION: 1. The left testicle is enlarged with decreased arterial and venous blood flow compared to the right. Partial or intermittent torsion is not excluded based on imaging. There is also increased blood flow in the left epididymis which could be seen in the setting of epididymitis. There is a complex hydrocele on the left. Findings discussed with Dr. Jacquelyne Balint Electronically Signed   By: Gerome Sam III M.D   On: 09/25/2015 21:13   [place] Art/ven Flow Abd Pelv Doppler  Result Date: 09/25/2015 CLINICAL DATA:  Left testicular pain. EXAM: SCROTAL ULTRASOUND DOPPLER ULTRASOUND OF THE TESTICLES TECHNIQUE: Complete ultrasound examination of the testicles, epididymis, and other scrotal structures was performed. Color and spectral Doppler ultrasound were also utilized to evaluate blood flow to the testicles. COMPARISON:  None. FINDINGS: Right testicle Measurements: 3.0 x 2.1 x 1.9 cm. No mass or microlithiasis visualized. Left testicle Measurements:  4.1 x 3.3 x 3.2 cm. The left testicles enlarged compared to the right with no masses. Right epididymis:  Normal in size and appearance. Left epididymis: Increased blood flow seen in the left epididymis compared to the right with no mass. Hydrocele:  There is a complex hydrocele on the left. Varicocele:  None visualized. Pulsed Doppler interrogation of both testes demonstrates normal blood flow on the right. There is decreased arterial an venous blood flow on the left compared to the right. There is increased blood flow within the left epididymis. IMPRESSION: 1. The left testicle is enlarged with decreased arterial and venous blood flow compared to the right.  Partial or intermittent torsion is not excluded based on imaging. There is also increased blood flow in the left epididymis which could be seen in the setting of epididymitis. There is a complex hydrocele on the left. Findings discussed with Dr. Jacquelyne Balint Electronically Signed   By: Gerome Sam III M.D   On: 09/25/2015 21:13    Assessment & Plan:   Diagnoses and all orders for this visit:  Bilateral low back pain without sciatica -     HYDROcodone-acetaminophen (NORCO) 10-325 MG tablet; Take 1 tablet by mouth every 6 (six) hours as needed for severe pain. Please fill on or after 10/15/15  WEIGHT GAIN -     HYDROcodone-acetaminophen (NORCO) 10-325 MG tablet; Take 1 tablet by mouth every 6 (six) hours as needed for severe pain. Please fill on or after 10/15/15  ELEVATED BP -     HYDROcodone-acetaminophen (NORCO) 10-325 MG tablet; Take 1 tablet by mouth every 6 (six) hours as needed for severe pain. Please fill on or after 10/15/15  Elevated PSA -     HYDROcodone-acetaminophen (NORCO) 10-325 MG tablet; Take 1 tablet by mouth every 6 (six) hours as needed for severe pain. Please fill on or after 10/15/15  Other orders -     amLODipine (NORVASC) 5 MG tablet; Take 1 tablet (5 mg total) by mouth daily. -     celecoxib (CELEBREX) 200 MG capsule; Take 1 capsule (200 mg total) by mouth as needed. -     polyethylene glycol powder (GLYCOLAX/MIRALAX) powder; MIX 17 GRAMS IN LIQUID AND DRINK DAILY AS NEEDED   I am having Mr. Ressel maintain his polyethylene glycol powder, tamsulosin, acetaminophen, HYDROcodone-acetaminophen, levofloxacin, amLODipine, celecoxib, and meloxicam.  Meds ordered this encounter  Medications  . amLODipine (NORVASC) 5 MG tablet    Sig: Take 1 tablet by mouth daily.  . celecoxib (CELEBREX) 200 MG capsule    Sig: Take 1 capsule by mouth as needed.  . meloxicam (MOBIC) 15 MG tablet     Follow-up: No Follow-up on file.  Sonda Primes, MD

## 2015-10-11 NOTE — Progress Notes (Signed)
Pre visit review using our clinic review tool, if applicable. No additional management support is needed unless otherwise documented below in the visit note. 

## 2015-10-11 NOTE — Patient Instructions (Signed)
Athletic support underwear

## 2015-10-11 NOTE — Assessment & Plan Note (Signed)
No relapse 

## 2015-10-11 NOTE — Assessment & Plan Note (Signed)
Chronic OA Norco prn Potential benefits of a long term opioids use as well as potential risks (i.e. addiction risk, apnea etc) and complications (i.e. Somnolence, constipation and others) were explained to the patient and were aknowledged. 

## 2015-11-09 ENCOUNTER — Ambulatory Visit (INDEPENDENT_AMBULATORY_CARE_PROVIDER_SITE_OTHER): Payer: Medicare Other | Admitting: Internal Medicine

## 2015-11-09 ENCOUNTER — Encounter: Payer: Self-pay | Admitting: Internal Medicine

## 2015-11-09 VITALS — BP 130/80 | HR 92 | Temp 98.6°F | Wt 159.0 lb

## 2015-11-09 DIAGNOSIS — R030 Elevated blood-pressure reading, without diagnosis of hypertension: Secondary | ICD-10-CM

## 2015-11-09 DIAGNOSIS — Z23 Encounter for immunization: Secondary | ICD-10-CM | POA: Diagnosis not present

## 2015-11-09 DIAGNOSIS — R42 Dizziness and giddiness: Secondary | ICD-10-CM

## 2015-11-09 DIAGNOSIS — R635 Abnormal weight gain: Secondary | ICD-10-CM

## 2015-11-09 DIAGNOSIS — M545 Low back pain, unspecified: Secondary | ICD-10-CM

## 2015-11-09 DIAGNOSIS — R972 Elevated prostate specific antigen [PSA]: Secondary | ICD-10-CM

## 2015-11-09 MED ORDER — HYDROCODONE-ACETAMINOPHEN 10-325 MG PO TABS: 1.0000 | ORAL_TABLET | Freq: Four times a day (QID) | ORAL | 0 refills | Status: DC | PRN

## 2015-11-09 NOTE — Progress Notes (Signed)
Pre visit review using our clinic review tool, if applicable. No additional management support is needed unless otherwise documented below in the visit note. 

## 2015-11-09 NOTE — Assessment & Plan Note (Signed)
Benign Positional Vertigo symptoms on the right Start Brandt - Daroff exercise several times a day as dirrected.  

## 2015-11-09 NOTE — Patient Instructions (Addendum)
Benign Positional Vertigo symptoms on the right. Start Laruth Bouchard - Daroff exercise several times a day as dirrected.Preventive Care for Adults, Male A healthy lifestyle and preventive care can promote health and wellness. Preventive health guidelines for men include the following key practices:  A routine yearly physical is a good way to check with your health care provider about your health and preventative screening. It is a chance to share any concerns and updates on your health and to receive a thorough exam.  Visit your dentist for a routine exam and preventative care every 6 months. Brush your teeth twice a day and floss once a day. Good oral hygiene prevents tooth decay and gum disease.  The frequency of eye exams is based on your age, health, family medical history, use of contact lenses, and other factors. Follow your health care provider's recommendations for frequency of eye exams.  Eat a healthy diet. Foods such as vegetables, fruits, whole grains, low-fat dairy products, and lean protein foods contain the nutrients you need without too many calories. Decrease your intake of foods high in solid fats, added sugars, and salt. Eat the right amount of calories for you.Get information about a proper diet from your health care provider, if necessary.  Regular physical exercise is one of the most important things you can do for your health. Most adults should get at least 150 minutes of moderate-intensity exercise (any activity that increases your heart rate and causes you to sweat) each week. In addition, most adults need muscle-strengthening exercises on 2 or more days a week.  Maintain a healthy weight. The body mass index (BMI) is a screening tool to identify possible weight problems. It provides an estimate of body fat based on height and weight. Your health care provider can find your BMI and can help you achieve or maintain a healthy weight.For adults 20 years and older:  A BMI below 18.5  is considered underweight.  A BMI of 18.5 to 24.9 is normal.  A BMI of 25 to 29.9 is considered overweight.  A BMI of 30 and above is considered obese.  Maintain normal blood lipids and cholesterol levels by exercising and minimizing your intake of saturated fat. Eat a balanced diet with plenty of fruit and vegetables. Blood tests for lipids and cholesterol should begin at age 16 and be repeated every 5 years. If your lipid or cholesterol levels are high, you are over 50, or you are at high risk for heart disease, you may need your cholesterol levels checked more frequently.Ongoing high lipid and cholesterol levels should be treated with medicines if diet and exercise are not working.  If you smoke, find out from your health care provider how to quit. If you do not use tobacco, do not start.  Lung cancer screening is recommended for adults aged 54-80 years who are at high risk for developing lung cancer because of a history of smoking. A yearly low-dose CT scan of the lungs is recommended for people who have at least a 30-pack-year history of smoking and are a current smoker or have quit within the past 15 years. A pack year of smoking is smoking an average of 1 pack of cigarettes a day for 1 year (for example: 1 pack a day for 30 years or 2 packs a day for 15 years). Yearly screening should continue until the smoker has stopped smoking for at least 15 years. Yearly screening should be stopped for people who develop a health problem that would prevent  them from having lung cancer treatment.  If you choose to drink alcohol, do not have more than 2 drinks per day. One drink is considered to be 12 ounces (355 mL) of beer, 5 ounces (148 mL) of wine, or 1.5 ounces (44 mL) of liquor.  Avoid use of street drugs. Do not share needles with anyone. Ask for help if you need support or instructions about stopping the use of drugs.  High blood pressure causes heart disease and increases the risk of stroke.  Your blood pressure should be checked at least every 1-2 years. Ongoing high blood pressure should be treated with medicines, if weight loss and exercise are not effective.  If you are 20-20 years old, ask your health care provider if you should take aspirin to prevent heart disease.  Diabetes screening is done by taking a blood sample to check your blood glucose level after you have not eaten for a certain period of time (fasting). If you are not overweight and you do not have risk factors for diabetes, you should be screened once every 3 years starting at age 69. If you are overweight or obese and you are 87-49 years of age, you should be screened for diabetes every year as part of your cardiovascular risk assessment.  Colorectal cancer can be detected and often prevented. Most routine colorectal cancer screening begins at the age of 76 and continues through age 35. However, your health care provider may recommend screening at an earlier age if you have risk factors for colon cancer. On a yearly basis, your health care provider may provide home test kits to check for hidden blood in the stool. Use of a small camera at the end of a tube to directly examine the colon (sigmoidoscopy or colonoscopy) can detect the earliest forms of colorectal cancer. Talk to your health care provider about this at age 1, when routine screening begins. Direct exam of the colon should be repeated every 5-10 years through age 60, unless early forms of precancerous polyps or small growths are found.  People who are at an increased risk for hepatitis B should be screened for this virus. You are considered at high risk for hepatitis B if:  You were born in a country where hepatitis B occurs often. Talk with your health care provider about which countries are considered high risk.  Your parents were born in a high-risk country and you have not received a shot to protect against hepatitis B (hepatitis B vaccine).  You have HIV  or AIDS.  You use needles to inject street drugs.  You live with, or have sex with, someone who has hepatitis B.  You are a man who has sex with other men (MSM).  You get hemodialysis treatment.  You take certain medicines for conditions such as cancer, organ transplantation, and autoimmune conditions.  Hepatitis C blood testing is recommended for all people born from [birth year] through 1965 and any individual with known risks for hepatitis C.  Practice safe sex. Use condoms and avoid high-risk sexual practices to reduce the spread of sexually transmitted infections (STIs). STIs include gonorrhea, chlamydia, syphilis, trichomonas, herpes, HPV, and human immunodeficiency virus (HIV). Herpes, HIV, and HPV are viral illnesses that have no cure. They can result in disability, cancer, and death.  If you are a man who has sex with other men, you should be screened at least once per year for:  HIV.  Urethral, rectal, and pharyngeal infection of gonorrhea, chlamydia, or both.  If you are at risk of being infected with HIV, it is recommended that you take a prescription medicine daily to prevent HIV infection. This is called preexposure prophylaxis (PrEP). You are considered at risk if:  You are a man who has sex with other men (MSM) and have other risk factors.  You are a heterosexual man, are sexually active, and are at increased risk for HIV infection.  You take drugs by injection.  You are sexually active with a partner who has HIV.  Talk with your health care provider about whether you are at high risk of being infected with HIV. If you choose to begin PrEP, you should first be tested for HIV. You should then be tested every 3 months for as long as you are taking PrEP.  A one-time screening for abdominal aortic aneurysm (AAA) and surgical repair of large AAAs by ultrasound are recommended for men ages 68 to 81 years who are current or former smokers.  Healthy men should no longer receive  prostate-specific antigen (PSA) blood tests as part of routine cancer screening. Talk with your health care provider about prostate cancer screening.  Testicular cancer screening is not recommended for adult males who have no symptoms. Screening includes self-exam, a health care provider exam, and other screening tests. Consult with your health care provider about any symptoms you have or any concerns you have about testicular cancer.  Use sunscreen. Apply sunscreen liberally and repeatedly throughout the day. You should seek shade when your shadow is shorter than you. Protect yourself by wearing long sleeves, pants, a wide-brimmed hat, and sunglasses year round, whenever you are outdoors.  Once a month, do a whole-body skin exam, using a mirror to look at the skin on your back. Tell your health care provider about new moles, moles that have irregular borders, moles that are larger than a pencil eraser, or moles that have changed in shape or color.  Stay current with required vaccines (immunizations).  Influenza vaccine. All adults should be immunized every year.  Tetanus, diphtheria, and acellular pertussis (Td, Tdap) vaccine. An adult who has not previously received Tdap or who does not know his vaccine status should receive 1 dose of Tdap. This initial dose should be followed by tetanus and diphtheria toxoids (Td) booster doses every 10 years. Adults with an unknown or incomplete history of completing a 3-dose immunization series with Td-containing vaccines should begin or complete a primary immunization series including a Tdap dose. Adults should receive a Td booster every 10 years.  Varicella vaccine. An adult without evidence of immunity to varicella should receive 2 doses or a second dose if he has previously received 1 dose.  Human papillomavirus (HPV) vaccine. Males aged 11-21 years who have not received the vaccine previously should receive the 3-dose series. Males aged 22-26 years may be  immunized. Immunization is recommended through the age of 32 years for any male who has sex with males and did not get any or all doses earlier. Immunization is recommended for any person with an immunocompromised condition through the age of 26 years if he did not get any or all doses earlier. During the 3-dose series, the second dose should be obtained 4-8 weeks after the first dose. The third dose should be obtained 24 weeks after the first dose and 16 weeks after the second dose.  Zoster vaccine. One dose is recommended for adults aged 29 years or older unless certain conditions are present.  Measles, mumps, and rubella (MMR)  vaccine. Adults born before [birth year] generally are considered immune to measles and mumps. Adults born in [birth year] or later should have 1 or more doses of MMR vaccine unless there is a contraindication to the vaccine or there is laboratory evidence of immunity to each of the three diseases. A routine second dose of MMR vaccine should be obtained at least 28 days after the first dose for students attending postsecondary schools, health care workers, or international travelers. People who received inactivated measles vaccine or an unknown type of measles vaccine during 1963-1967 should receive 2 doses of MMR vaccine. People who received inactivated mumps vaccine or an unknown type of mumps vaccine before 1979 and are at high risk for mumps infection should consider immunization with 2 doses of MMR vaccine. Unvaccinated health care workers born before [birth year] who lack laboratory evidence of measles, mumps, or rubella immunity or laboratory confirmation of disease should consider measles and mumps immunization with 2 doses of MMR vaccine or rubella immunization with 1 dose of MMR vaccine.  Pneumococcal 13-valent conjugate (PCV13) vaccine. When indicated, a person who is uncertain of his immunization history and has no record of immunization should receive the PCV13 vaccine. All adults 35 years of  age and older should receive this vaccine. An adult aged 2 years or older who has certain medical conditions and has not been previously immunized should receive 1 dose of PCV13 vaccine. This PCV13 should be followed with a dose of pneumococcal polysaccharide (PPSV23) vaccine. Adults who are at high risk for pneumococcal disease should obtain the PPSV23 vaccine at least 8 weeks after the dose of PCV13 vaccine. Adults older than 64 years of age who have normal immune system function should obtain the PPSV23 vaccine dose at least 1 year after the dose of PCV13 vaccine.  Pneumococcal polysaccharide (PPSV23) vaccine. When PCV13 is also indicated, PCV13 should be obtained first. All adults aged 58 years and older should be immunized. An adult younger than age 6 years who has certain medical conditions should be immunized. Any person who resides in a nursing home or long-term care facility should be immunized. An adult smoker should be immunized. People with an immunocompromised condition and certain other conditions should receive both PCV13 and PPSV23 vaccines. People with human immunodeficiency virus (HIV) infection should be immunized as soon as possible after diagnosis. Immunization during chemotherapy or radiation therapy should be avoided. Routine use of PPSV23 vaccine is not recommended for American Indians, [place] Natives, or people younger than 65 years unless there are medical conditions that require PPSV23 vaccine. When indicated, people who have unknown immunization and have no record of immunization should receive PPSV23 vaccine. One-time revaccination 5 years after the first dose of PPSV23 is recommended for people aged 19-64 years who have chronic kidney failure, nephrotic syndrome, asplenia, or immunocompromised conditions. People who received 1-2 doses of PPSV23 before age 15 years should receive another dose of PPSV23 vaccine at age 51 years or later if at least 5 years have passed since the  previous dose. Doses of PPSV23 are not needed for people immunized with PPSV23 at or after age 67 years.  Meningococcal vaccine. Adults with asplenia or persistent complement component deficiencies should receive 2 doses of quadrivalent meningococcal conjugate (MenACWY-D) vaccine. The doses should be obtained at least 2 months apart. Microbiologists working with certain meningococcal bacteria, [place] recruits, people at risk during an outbreak, and people who travel to or live in countries with a high rate of meningitis should be immunized. A first-year  college student up through age 54 years who is living in a residence hall should receive a dose if he did not receive a dose on or after his 16th birthday. Adults who have certain high-risk conditions should receive one or more doses of vaccine.  Hepatitis A vaccine. Adults who wish to be protected from this disease, have chronic liver disease, work with hepatitis A-infected animals, work in hepatitis A research labs, or travel to or work in countries with a high rate of hepatitis A should be immunized. Adults who were previously unvaccinated and who anticipate close contact with an international adoptee during the first 60 days after arrival in the [place] from a country with a high rate of hepatitis A should be immunized.  Hepatitis B vaccine. Adults should be immunized if they wish to be protected from this disease, are under age 78 years and have diabetes, have chronic liver disease, have had more than one sex partner in the past 6 months, may be exposed to blood or other infectious body fluids, are household contacts or sex partners of hepatitis B positive people, are clients or workers in certain care facilities, or travel to or work in countries with a high rate of hepatitis B.  Haemophilus influenzae type b (Hib) vaccine. A previously unvaccinated person with asplenia or sickle cell disease or having a scheduled splenectomy should receive 1  dose of Hib vaccine. Regardless of previous immunization, a recipient of a hematopoietic stem cell transplant should receive a 3-dose series 6-12 months after his successful transplant. Hib vaccine is not recommended for adults with HIV infection. Preventive Service / Frequency Ages 70 to 81  Blood pressure check.** / Every 3-5 years.  Lipid and cholesterol check.** / Every 5 years beginning at age 37.  Hepatitis C blood test.** / For any individual with known risks for hepatitis C.  Skin self-exam. / Monthly.  Influenza vaccine. / Every year.  Tetanus, diphtheria, and acellular pertussis (Tdap, Td) vaccine.** / Consult your health care provider. 1 dose of Td every 10 years.  Varicella vaccine.** / Consult your health care provider.  HPV vaccine. / 3 doses over 6 months, if 29 or younger.  Measles, mumps, rubella (MMR) vaccine.** / You need at least 1 dose of MMR if you were born in 1957 or later. You may also need a second dose.  Pneumococcal 13-valent conjugate (PCV13) vaccine.** / Consult your health care provider.  Pneumococcal polysaccharide (PPSV23) vaccine.** / 1 to 2 doses if you smoke cigarettes or if you have certain conditions.  Meningococcal vaccine.** / 1 dose if you are age 52 to 69 years and a [employer] researcher living in a residence hall, or have one of several medical conditions. You may also need additional booster doses.  Hepatitis A vaccine.** / Consult your health care provider.  Hepatitis B vaccine.** / Consult your health care provider.  Haemophilus influenzae type b (Hib) vaccine.** / Consult your health care provider. Ages 76 to 58  Blood pressure check.** / Every year.  Lipid and cholesterol check.** / Every 5 years beginning at age 58.  Lung cancer screening. / Every year if you are aged 36-80 years and have a 30-pack-year history of smoking and currently smoke or have quit within the past 15 years. Yearly screening is stopped once you have  quit smoking for at least 15 years or develop a health problem that would prevent you from having lung cancer treatment.  Fecal occult blood test (FOBT) of stool. /  Every year beginning at age 78 and continuing until age 71. You may not have to do this test if you get a colonoscopy every 10 years.  Flexible sigmoidoscopy** or colonoscopy.** / Every 5 years for a flexible sigmoidoscopy or every 10 years for a colonoscopy beginning at age 27 and continuing until age 40.  Hepatitis C blood test.** / For all people born from [birth year] through 1965 and any individual with known risks for hepatitis C.  Skin self-exam. / Monthly.  Influenza vaccine. / Every year.  Tetanus, diphtheria, and acellular pertussis (Tdap/Td) vaccine.** / Consult your health care provider. 1 dose of Td every 10 years.  Varicella vaccine.** / Consult your health care provider.  Zoster vaccine.** / 1 dose for adults aged 63 years or older.  Measles, mumps, rubella (MMR) vaccine.** / You need at least 1 dose of MMR if you were born in 1957 or later. You may also need a second dose.  Pneumococcal 13-valent conjugate (PCV13) vaccine.** / Consult your health care provider.  Pneumococcal polysaccharide (PPSV23) vaccine.** / 1 to 2 doses if you smoke cigarettes or if you have certain conditions.  Meningococcal vaccine.** / Consult your health care provider.  Hepatitis A vaccine.** / Consult your health care provider.  Hepatitis B vaccine.** / Consult your health care provider.  Haemophilus influenzae type b (Hib) vaccine.** / Consult your health care provider. Ages 81 and over  Blood pressure check.** / Every year.  Lipid and cholesterol check.**/ Every 5 years beginning at age 27.  Lung cancer screening. / Every year if you are aged 59-80 years and have a 30-pack-year history of smoking and currently smoke or have quit within the past 15 years. Yearly screening is stopped once you have quit smoking for at least 15 years or  develop a health problem that would prevent you from having lung cancer treatment.  Fecal occult blood test (FOBT) of stool. / Every year beginning at age 55 and continuing until age 90. You may not have to do this test if you get a colonoscopy every 10 years.  Flexible sigmoidoscopy** or colonoscopy.** / Every 5 years for a flexible sigmoidoscopy or every 10 years for a colonoscopy beginning at age 7 and continuing until age 86.  Hepatitis C blood test.** / For all people born from [birth year] through 1964 and any individual with known risks for hepatitis C.  Abdominal aortic aneurysm (AAA) screening.** / A one-time screening for ages 60 to 46 years who are current or former smokers.  Skin self-exam. / Monthly.  Influenza vaccine. / Every year.  Tetanus, diphtheria, and acellular pertussis (Tdap/Td) vaccine.** / 1 dose of Td every 10 years.  Varicella vaccine.** / Consult your health care provider.  Zoster vaccine.** / 1 dose for adults aged 21 years or older.  Pneumococcal 13-valent conjugate (PCV13) vaccine.** / 1 dose for all adults aged 68 years and older.  Pneumococcal polysaccharide (PPSV23) vaccine.** / 1 dose for all adults aged 71 years and older.  Meningococcal vaccine.** / Consult your health care provider.  Hepatitis A vaccine.** / Consult your health care provider.  Hepatitis B vaccine.** / Consult your health care provider.  Haemophilus influenzae type b (Hib) vaccine.** / Consult your health care provider. **Family history and personal history of risk and conditions may change your health care provider's recommendations.   This information is not intended to replace advice given to you by your health care provider. Make sure you discuss any questions you have with your health care  provider.   Document Released: 04/24/2001 Document Revised: 03/19/2014 Document Reviewed: 07/24/2010 Elsevier Interactive Patient Education [employer].

## 2015-11-09 NOTE — Progress Notes (Signed)
Subjective:  Patient ID: Gordon Lucas, male    DOB: 11-Apr-1950  Age: 65 y.o. MRN: [id number]  CC: No chief complaint on file.   HPI Gordon Lucas presents for a well exam C/o dizziness x 2 -3 d  Outpatient Medications Prior to Visit  Medication Sig Dispense Refill  . amLODipine (NORVASC) 5 MG tablet Take 1 tablet (5 mg total) by mouth daily. 30 tablet 11  . celecoxib (CELEBREX) 200 MG capsule Take 1 capsule (200 mg total) by mouth 2 (two) times daily as needed. 60 capsule 3  . HYDROcodone-acetaminophen (NORCO) 10-325 MG tablet Take 1 tablet by mouth every 6 (six) hours as needed for severe pain. Please fill on or after 11/15/15 10 tablet 0  . polyethylene glycol powder (GLYCOLAX/MIRALAX) powder MIX 17 GRAMS IN LIQUID AND DRINK DAILY AS NEEDED 527 g 3  . tamsulosin (FLOMAX) 0.4 MG CAPS capsule Take 1 capsule (0.4 mg total) by mouth daily. 90 capsule 3  . acetaminophen (TYLENOL) 500 MG tablet Take 1,500 mg by mouth every 6 (six) hours as needed (pain).    . meloxicam (MOBIC) 15 MG tablet     . levofloxacin (LEVAQUIN) 500 MG tablet Take 1 tablet (500 mg total) by mouth daily. (Patient not taking: Reported on 11/09/2015) 7 tablet 0   No facility-administered medications prior to visit.     ROS Review of Systems  Constitutional: Negative for appetite change, fatigue and unexpected weight change.  HENT: Negative for congestion, nosebleeds, sneezing, sore throat and trouble swallowing.   Eyes: Negative for itching and visual disturbance.  Respiratory: Negative for cough.   Cardiovascular: Negative for chest pain, palpitations and leg swelling.  Gastrointestinal: Negative for abdominal distention, blood in stool, diarrhea and nausea.  Genitourinary: Negative for frequency and hematuria.  Musculoskeletal: Positive for back pain. Negative for gait problem, joint swelling and neck pain.  Skin: Negative for rash.  Neurological: Positive for dizziness. Negative for tremors, speech  difficulty and weakness.  Psychiatric/Behavioral: Negative for agitation, dysphoric mood and sleep disturbance. The patient is not nervous/anxious.     Objective:  BP (!) 166/92   Pulse 92   Temp 98.6 F (37 C) (Oral)   Wt 159 lb (72.1 kg)   SpO2 98%   BMI 24.90 kg/m   BP Readings from Last 3 Encounters:  11/09/15 (!) 166/92  10/11/15 138/78  10/05/15 130/70    Wt Readings from Last 3 Encounters:  11/09/15 159 lb (72.1 kg)  10/11/15 161 lb (73 kg)  10/05/15 161 lb (73 kg)    Physical Exam  Constitutional: He is oriented to person, place, and time. He appears well-developed. No distress.  NAD  HENT:  Mouth/Throat: Oropharynx is clear and moist.  Eyes: Conjunctivae are normal. Pupils are equal, round, and reactive to light.  Neck: Normal range of motion. No JVD present. No thyromegaly present.  Cardiovascular: Normal rate, regular rhythm, normal heart sounds and intact distal pulses.  Exam reveals no gallop and no friction rub.   No murmur heard. Pulmonary/Chest: Effort normal and breath sounds normal. No respiratory distress. He has no wheezes. He has no rales. He exhibits no tenderness.  Abdominal: Soft. Bowel sounds are normal. He exhibits no distension and no mass. There is no tenderness. There is no rebound and no guarding.  Musculoskeletal: Normal range of motion. He exhibits tenderness. He exhibits no edema.  Lymphadenopathy:    He has no cervical adenopathy.  Neurological: He is alert and oriented to person, place,  and time. He has normal reflexes. No cranial nerve deficit. He exhibits normal muscle tone. He displays a negative Romberg sign. Coordination and gait normal.  Skin: Skin is warm and dry. No rash noted.  Psychiatric: He has a normal mood and affect. His behavior is normal. Judgment and thought content normal.  H-P (+) on the R Rectal - per Urology  Lab Results  Component Value Date   WBC 17.4 (H) 10/05/2015   HGB 13.0 10/05/2015   HCT 39.7 10/05/2015    PLT 347.0 10/05/2015   GLUCOSE 100 (H) 10/05/2015   CHOL 182 10/04/2014   TRIG 116.0 10/04/2014   HDL 28.00 (L) 10/04/2014   LDLDIRECT 152.8 09/15/2012   LDLCALC 131 (H) 10/04/2014   ALT 14 (L) 09/26/2015   AST 18 09/26/2015   NA 139 10/05/2015   K 3.9 10/05/2015   CL 102 10/05/2015   CREATININE 0.86 10/05/2015   BUN 12 10/05/2015   CO2 30 10/05/2015   TSH 1.26 10/04/2014   PSA 1.19 10/04/2014   HGBA1C 4.9 10/04/2014    Dg Chest 2 View  Result Date: 09/25/2015 CLINICAL DATA:  Patient with pain and swelling to the left testicle. Possible sepsis. EXAM: CHEST  2 VIEW COMPARISON:  Chest radiograph 02/08/2015. FINDINGS: Normal cardiac and mediastinal contours. No consolidative pulmonary opacities. No pleural effusion or pneumothorax. Mid thoracic spine degenerative changes. IMPRESSION: No active cardiopulmonary disease. Electronically Signed   By: Annia Belt M.D.   On: 09/25/2015 19:00   US Scrotum  Result Date: 09/25/2015 CLINICAL DATA:  Left testicular pain. EXAM: SCROTAL ULTRASOUND DOPPLER ULTRASOUND OF THE TESTICLES TECHNIQUE: Complete ultrasound examination of the testicles, epididymis, and other scrotal structures was performed. Color and spectral Doppler ultrasound were also utilized to evaluate blood flow to the testicles. COMPARISON:  None. FINDINGS: Right testicle Measurements: 3.0 x 2.1 x 1.9 cm. No mass or microlithiasis visualized. Left testicle Measurements: 4.1 x 3.3 x 3.2 cm. The left testicles enlarged compared to the right with no masses. Right epididymis:  Normal in size and appearance. Left epididymis: Increased blood flow seen in the left epididymis compared to the right with no mass. Hydrocele:  There is a complex hydrocele on the left. Varicocele:  None visualized. Pulsed Doppler interrogation of both testes demonstrates normal blood flow on the right. There is decreased arterial an venous blood flow on the left compared to the right. There is increased blood flow  within the left epididymis. IMPRESSION: 1. The left testicle is enlarged with decreased arterial and venous blood flow compared to the right. Partial or intermittent torsion is not excluded based on imaging. There is also increased blood flow in the left epididymis which could be seen in the setting of epididymitis. There is a complex hydrocele on the left. Findings discussed with Dr. Jacquelyne Balint Electronically Signed   By: Gerome Sam III M.D   On: 09/25/2015 21:13   [place] Art/ven Flow Abd Pelv Doppler  Result Date: 09/25/2015 CLINICAL DATA:  Left testicular pain. EXAM: SCROTAL ULTRASOUND DOPPLER ULTRASOUND OF THE TESTICLES TECHNIQUE: Complete ultrasound examination of the testicles, epididymis, and other scrotal structures was performed. Color and spectral Doppler ultrasound were also utilized to evaluate blood flow to the testicles. COMPARISON:  None. FINDINGS: Right testicle Measurements: 3.0 x 2.1 x 1.9 cm. No mass or microlithiasis visualized. Left testicle Measurements: 4.1 x 3.3 x 3.2 cm. The left testicles enlarged compared to the right with no masses. Right epididymis:  Normal in size and appearance. Left epididymis: Increased  blood flow seen in the left epididymis compared to the right with no mass. Hydrocele:  There is a complex hydrocele on the left. Varicocele:  None visualized. Pulsed Doppler interrogation of both testes demonstrates normal blood flow on the right. There is decreased arterial an venous blood flow on the left compared to the right. There is increased blood flow within the left epididymis. IMPRESSION: 1. The left testicle is enlarged with decreased arterial and venous blood flow compared to the right. Partial or intermittent torsion is not excluded based on imaging. There is also increased blood flow in the left epididymis which could be seen in the setting of epididymitis. There is a complex hydrocele on the left. Findings discussed with Dr. Jacquelyne Balint Electronically Signed   By:  Gerome Sam III M.D   On: 09/25/2015 21:13    Assessment & Plan:   There are no diagnoses linked to this encounter. I have discontinued Mr. Muto levofloxacin. I am also having him maintain his tamsulosin, acetaminophen, meloxicam, amLODipine, celecoxib, HYDROcodone-acetaminophen, and polyethylene glycol powder.  No orders of the defined types were placed in this encounter.    Follow-up: No Follow-up on file.  Sonda Primes, MD

## 2015-11-15 ENCOUNTER — Telehealth: Payer: Self-pay

## 2015-11-15 DIAGNOSIS — M545 Low back pain, unspecified: Secondary | ICD-10-CM

## 2015-11-15 DIAGNOSIS — R635 Abnormal weight gain: Secondary | ICD-10-CM

## 2015-11-15 DIAGNOSIS — R972 Elevated prostate specific antigen [PSA]: Secondary | ICD-10-CM

## 2015-11-15 DIAGNOSIS — R030 Elevated blood-pressure reading, without diagnosis of hypertension: Secondary | ICD-10-CM

## 2015-11-15 NOTE — Telephone Encounter (Signed)
Pt called stating that Rx for Hydrocodone that was written 08/30 to be filled on 09/05 was for #10 instead of #120.  Pt is requesting correct Rx, please advise. Thanks!

## 2015-11-15 NOTE — Telephone Encounter (Signed)
OK to correct Thx

## 2015-11-16 MED ORDER — HYDROCODONE-ACETAMINOPHEN 10-325 MG PO TABS: 1.0000 | ORAL_TABLET | Freq: Four times a day (QID) | ORAL | 0 refills | Status: DC | PRN

## 2015-11-16 NOTE — Telephone Encounter (Signed)
Pt informed, Rx in cabinet for pt pick up  

## 2016-01-31 ENCOUNTER — Ambulatory Visit (INDEPENDENT_AMBULATORY_CARE_PROVIDER_SITE_OTHER): Payer: Medicare Other | Admitting: Adult Health

## 2016-01-31 ENCOUNTER — Encounter: Payer: Self-pay | Admitting: Adult Health

## 2016-01-31 VITALS — BP 140/80 | Temp 98.4°F | Ht 67.0 in | Wt 164.5 lb

## 2016-01-31 DIAGNOSIS — H8110 Benign paroxysmal vertigo, unspecified ear: Secondary | ICD-10-CM

## 2016-01-31 MED ORDER — MECLIZINE HCL 25 MG PO TABS: 25.0000 mg | ORAL_TABLET | Freq: Three times a day (TID) | ORAL | 0 refills | Status: AC | PRN

## 2016-01-31 NOTE — Patient Instructions (Addendum)
It was great meeting you today  I think your symptoms are consistent with vertigo. Please take the Meclizine only as needed  I have also included instructions for home exercises to help with the spells.   Follow up with PCP if needed   Benign Positional Vertigo Introduction Vertigo is the feeling that you or your surroundings are moving when they are not. Benign positional vertigo is the most common form of vertigo. The cause of this condition is not serious (is benign). This condition is triggered by certain movements and positions (is positional). This condition can be dangerous if it occurs while you are doing something that could endanger you or others, such as driving. What are the causes? In many cases, the cause of this condition is not known. It may be caused by a disturbance in an area of the inner ear that helps your brain to sense movement and balance. This disturbance can be caused by a viral infection (labyrinthitis), head injury, or repetitive motion. What increases the risk? This condition is more likely to develop in:  Women.  People who are 64 years of age or older. What are the signs or symptoms? Symptoms of this condition usually happen when you move your head or your eyes in different directions. Symptoms may start suddenly, and they usually last for less than a minute. Symptoms may include:  Loss of balance and falling.  Feeling like you are spinning or moving.  Feeling like your surroundings are spinning or moving.  Nausea and vomiting.  Blurred vision.  Dizziness.  Involuntary eye movement (nystagmus). Symptoms can be mild and cause only slight annoyance, or they can be severe and interfere with daily life. Episodes of benign positional vertigo may return (recur) over time, and they may be triggered by certain movements. Symptoms may improve over time. How is this diagnosed? This condition is usually diagnosed by medical history and a physical exam of the  head, neck, and ears. You may be referred to a health care provider who specializes in ear, nose, and throat (ENT) problems (otolaryngologist) or a provider who specializes in disorders of the nervous system (neurologist). You may have additional testing, including:  MRI.  A CT scan.  Eye movement tests. Your health care provider may ask you to change positions quickly while he or she watches you for symptoms of benign positional vertigo, such as nystagmus. Eye movement may be tested with an electronystagmogram (ENG), caloric stimulation, the Dix-Hallpike test, or the roll test.  An electroencephalogram (EEG). This records electrical activity in your brain.  Hearing tests. How is this treated? Usually, your health care provider will treat this by moving your head in specific positions to adjust your inner ear back to normal. Surgery may be needed in severe cases, but this is rare. In some cases, benign positional vertigo may resolve on its own in 2-4 weeks. Follow these instructions at home: Safety  Move slowly.Avoid sudden body or head movements.  Avoid driving.  Avoid operating heavy machinery.  Avoid doing any tasks that would be dangerous to you or others if a vertigo episode would occur.  If you have trouble walking or keeping your balance, try using a cane for stability. If you feel dizzy or unstable, sit down right away.  Return to your normal activities as told by your health care provider. Ask your health care provider what activities are safe for you. General instructions  Take over-the-counter and prescription medicines only as told by your health care provider.  Avoid certain positions or movements as told by your health care provider.  Drink enough fluid to keep your urine clear or pale yellow.  Keep all follow-up visits as told by your health care provider. This is important. Contact a health care provider if:  You have a fever.  Your condition gets worse or you  develop new symptoms.  Your family or friends notice any behavioral changes.  Your nausea or vomiting gets worse.  You have numbness or a "pins and needles" sensation. Get help right away if:  You have difficulty speaking or moving.  You are always dizzy.  You faint.  You develop severe headaches.  You have weakness in your legs or arms.  You have changes in your hearing or vision.  You develop a stiff neck.  You develop sensitivity to light. This information is not intended to replace advice given to you by your health care provider. Make sure you discuss any questions you have with your health care provider. Document Released: 12/04/2005 Document Revised: 08/04/2015 Document Reviewed: 06/21/2014  2017 [employer]vier

## 2016-01-31 NOTE — Progress Notes (Signed)
Subjective:    Patient ID: Gordon Lucas, male    DOB: 1950/05/01, 65 y.o.   MRN: [id number]  HPI  65 year old male who  has a past medical history of Arthritis; Asthma; BPH (benign prostatic hypertrophy); COPD (chronic obstructive pulmonary disease) (HCC); ED (erectile dysfunction); Imbalance; Kidney stones; Low back pain; Microscopic hematuria; Orchitis, left (hospitalized 09/25/2015); Osteoporosis; and Urinary hesitancy. He is a patient of Dr.Platnikov who I am seeing for the first time today.   He reports that he has had " on again and off again" dizziness. He reports that often it is when he sits up or bends over to get something off the floor. He does report that it is more as though the " room is spinning". This has been an on going issues since the end of August at which time he saw his PCP.   He denies any dizziness today    Review of Systems  Constitutional: Negative.   HENT: Negative.   Eyes: Negative.   Respiratory: Negative.   Neurological: Positive for dizziness. Negative for speech difficulty, weakness, light-headedness and headaches.  Hematological: Negative.   Psychiatric/Behavioral: Negative.   All other systems reviewed and are negative.  Past Medical History:  Diagnosis Date  . Arthritis   . Asthma   . BPH (benign prostatic hypertrophy)   . COPD (chronic obstructive pulmonary disease) (HCC)   . ED (erectile dysfunction)   . Imbalance   . Kidney stones   . Low back pain   . Microscopic hematuria    s/p Urol w/up  . Orchitis, left hospitalized 09/25/2015  . Osteoporosis   . Urinary hesitancy     Social History   Social History  . Marital status: Widowed    Spouse name: N/A  . Number of children: N/A  . Years of education: N/A   Occupational History  . Not on file.   Social History Main Topics  . Smoking status: Current Every Day Smoker    Packs/day: 1.00    Years: 47.00    Types: Cigarettes  . Smokeless tobacco: Never Used  . Alcohol  use 6.6 oz/week    11 Shots of liquor per week     Comment: 09/29/2015 "pint of liquor/week  . Drug use: No  . Sexual activity: Yes    Birth control/ protection: Condom   Other Topics Concern  . Not on file   Social History Narrative  . No narrative on file    Past Surgical History:  Procedure Laterality Date  . CYSTOSCOPY WITH RETROGRADE PYELOGRAM, URETEROSCOPY AND STENT PLACEMENT  02/11/2015  . HOLMIUM LASER APPLICATION N/A 02/11/2015   Procedure: HOLMIUM LASER APPLICATION;  Surgeon: Jethro BolusSigmund Tannenbaum, MD;  [facility] [place];  Service: Urology;  Laterality: N/A;  . 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 Hypertension Other     No Known Allergies  Current Outpatient Prescriptions on File Prior to Visit  Medication Sig Dispense Refill  . acetaminophen (TYLENOL) 500 MG tablet Take 1,500 mg by mouth every 6 (six) hours as needed (pain).    Marland Kitchen. amLODipine (NORVASC) 5 MG tablet Take 1 tablet (5 mg total) by mouth daily.  30 tablet 11  . celecoxib (CELEBREX) 200 MG capsule Take 1 capsule (200 mg total) by mouth 2 (two) times daily as needed. 60 capsule 3  . HYDROcodone-acetaminophen (NORCO) 10-325 MG tablet Take 1 tablet by mouth every 6 (six) hours as needed for severe pain. Please fill on or after 11/16/15 110 tablet 0  . meloxicam (MOBIC) 15 MG tablet     . polyethylene glycol powder (GLYCOLAX/MIRALAX) powder MIX 17 GRAMS IN LIQUID AND DRINK DAILY AS NEEDED 527 g 3  . tamsulosin (FLOMAX) 0.4 MG CAPS capsule Take 1 capsule (0.4 mg total) by mouth daily. 90 capsule 3   No current facility-administered medications on  file prior to visit.     BP 140/80   Temp 98.4 F (36.9 C) (Oral)   Ht 5\' 7"  (1.702 m)   Wt 164 lb 8 oz (74.6 kg)   BMI 25.76 kg/m       Objective:   Physical Exam  Constitutional: He is oriented to person, place, and time. He appears well-developed and well-nourished. No distress.  HENT:  Head: Normocephalic and atraumatic.  Right Ear: External ear normal.  Left Ear: External ear normal.  Nose: Nose normal.  Mouth/Throat: Oropharynx is clear and moist.  Eyes: Conjunctivae are normal. Pupils are equal, round, and reactive to light. Right eye exhibits no discharge. Left eye exhibits no discharge. No scleral icterus. Right eye exhibits nystagmus (verticle ). Left eye exhibits nystagmus (vertico ).  Neck: Normal range of motion. Neck supple. No JVD present. Carotid bruit is not present. No thyromegaly present.  Cardiovascular: Normal rate, regular rhythm, normal heart sounds and intact distal pulses.  Exam reveals no gallop.   No murmur heard. Pulmonary/Chest: Effort normal and breath sounds normal. No respiratory distress. He has no wheezes. He has no rales. He exhibits no tenderness.  Musculoskeletal: Normal range of motion. He exhibits no edema, tenderness or deformity.  Neurological: He is alert and oriented to person, place, and time.  Skin: Skin is warm and dry. No rash noted. He is not diaphoretic. No erythema. No pallor.  Psychiatric: He has a normal mood and affect. His behavior is normal. Thought content normal.  Nursing note and vitals reviewed.      Assessment & Plan:   1. Benign paroxysmal positional vertigo, unspecified laterality - meclizine (ANTIVERT) 25 MG tablet; Take 1 tablet (25 mg total) by mouth 3 (three) times daily as needed for dizziness.  Dispense: 30 tablet; Refill: 0 - Home Epley maneuver directions given  - Stay hydrated  - Follow up with PCP if no improvement  Shirline Freesory Fatih Stalvey, NP

## 2016-02-06 ENCOUNTER — Encounter: Payer: Self-pay | Admitting: Internal Medicine

## 2016-02-06 ENCOUNTER — Ambulatory Visit (INDEPENDENT_AMBULATORY_CARE_PROVIDER_SITE_OTHER): Payer: Medicare Other | Admitting: Internal Medicine

## 2016-02-06 DIAGNOSIS — R030 Elevated blood-pressure reading, without diagnosis of hypertension: Secondary | ICD-10-CM | POA: Diagnosis not present

## 2016-02-06 DIAGNOSIS — R635 Abnormal weight gain: Secondary | ICD-10-CM

## 2016-02-06 DIAGNOSIS — G4700 Insomnia, unspecified: Secondary | ICD-10-CM

## 2016-02-06 DIAGNOSIS — M545 Low back pain: Secondary | ICD-10-CM

## 2016-02-06 DIAGNOSIS — R42 Dizziness and giddiness: Secondary | ICD-10-CM

## 2016-02-06 DIAGNOSIS — R972 Elevated prostate specific antigen [PSA]: Secondary | ICD-10-CM

## 2016-02-06 DIAGNOSIS — G8929 Other chronic pain: Secondary | ICD-10-CM

## 2016-02-06 MED ORDER — HYDROCODONE-ACETAMINOPHEN 7.5-325 MG PO TABS: 1.0000 | ORAL_TABLET | Freq: Four times a day (QID) | ORAL | 0 refills | Status: DC | PRN

## 2016-02-06 MED ORDER — TAMSULOSIN HCL 0.4 MG PO CAPS: 0.4000 mg | ORAL_CAPSULE | Freq: Every day | ORAL | 11 refills | Status: AC

## 2016-02-06 MED ORDER — GABAPENTIN 300 MG PO CAPS: 300.0000 mg | ORAL_CAPSULE | Freq: Every day | ORAL | 5 refills | Status: AC

## 2016-02-06 MED ORDER — AMLODIPINE BESYLATE 5 MG PO TABS: 5.0000 mg | ORAL_TABLET | Freq: Every day | ORAL | 11 refills | Status: DC

## 2016-02-06 MED ORDER — SILDENAFIL CITRATE 100 MG PO TABS: 50.0000 mg | ORAL_TABLET | ORAL | 4 refills | Status: AC | PRN

## 2016-02-06 MED ORDER — MELOXICAM 15 MG PO TABS: 15.0000 mg | ORAL_TABLET | Freq: Every day | ORAL | 11 refills | Status: AC

## 2016-02-06 MED ORDER — CELECOXIB 200 MG PO CAPS: 200.0000 mg | ORAL_CAPSULE | Freq: Two times a day (BID) | ORAL | 11 refills | Status: AC | PRN

## 2016-02-06 NOTE — Assessment & Plan Note (Signed)
norco dose was reduced

## 2016-02-06 NOTE — Assessment & Plan Note (Signed)
Meclizine prn 

## 2016-02-06 NOTE — Assessment & Plan Note (Signed)
Gabapentin at hs 

## 2016-02-06 NOTE — Progress Notes (Signed)
Pre visit review using our clinic review tool, if applicable. No additional management support is needed unless otherwise documented below in the visit note. 

## 2016-02-06 NOTE — Progress Notes (Signed)
Subjective:  Patient ID: Gordon Lucas, male    DOB: 1951-02-08  Age: 65 y.o. MRN: [id number]  CC: No chief complaint on file.   HPI Gordon Lucas presents for LBP, HTN, OA C/o dizzy episode x 1 hr last week C/o insomnia   Outpatient Medications Prior to Visit  Medication Sig Dispense Refill  . acetaminophen (TYLENOL) 500 MG tablet Take 1,500 mg by mouth every 6 (six) hours as needed (pain).    Marland Kitchen. amLODipine (NORVASC) 5 MG tablet Take 1 tablet (5 mg total) by mouth daily. 30 tablet 11  . celecoxib (CELEBREX) 200 MG capsule Take 1 capsule (200 mg total) by mouth 2 (two) times daily as needed. 60 capsule 3  . HYDROcodone-acetaminophen (NORCO) 10-325 MG tablet Take 1 tablet by mouth every 6 (six) hours as needed for severe pain. Please fill on or after 11/16/15 110 tablet 0  . meclizine (ANTIVERT) 25 MG tablet Take 1 tablet (25 mg total) by mouth 3 (three) times daily as needed for dizziness. 30 tablet 0  . meloxicam (MOBIC) 15 MG tablet     . polyethylene glycol powder (GLYCOLAX/MIRALAX) powder MIX 17 GRAMS IN LIQUID AND DRINK DAILY AS NEEDED 527 g 3  . tamsulosin (FLOMAX) 0.4 MG CAPS capsule Take 1 capsule (0.4 mg total) by mouth daily. 90 capsule 3   No facility-administered medications prior to visit.     ROS Review of Systems  Constitutional: Negative for appetite change, fatigue and unexpected weight change.  HENT: Negative for congestion, nosebleeds, sneezing, sore throat and trouble swallowing.   Eyes: Negative for itching and visual disturbance.  Respiratory: Negative for cough.   Cardiovascular: Negative for chest pain, palpitations and leg swelling.  Gastrointestinal: Negative for abdominal distention, blood in stool, diarrhea and nausea.  Genitourinary: Negative for frequency and hematuria.  Musculoskeletal: Positive for back pain. Negative for gait problem, joint swelling and neck pain.  Skin: Negative for rash.  Neurological: Positive for dizziness.  Negative for tremors, speech difficulty and weakness.  Psychiatric/Behavioral: Negative for agitation, dysphoric mood and sleep disturbance. The patient is not nervous/anxious.     Objective:  BP 112/64   Pulse 100   Temp 98.6 F (37 C) (Oral)   Resp 16   Ht 5\' 7"  (1.702 m)   Wt 166 lb (75.3 kg)   SpO2 98%   BMI 26.00 kg/m   BP Readings from Last 3 Encounters:  02/06/16 112/64  01/31/16 140/80  11/09/15 130/80    Wt Readings from Last 3 Encounters:  02/06/16 166 lb (75.3 kg)  01/31/16 164 lb 8 oz (74.6 kg)  11/09/15 159 lb (72.1 kg)    Physical Exam  Constitutional: He is oriented to person, place, and time. He appears well-developed. No distress.  NAD  HENT:  Mouth/Throat: Oropharynx is clear and moist.  Eyes: Conjunctivae are normal. Pupils are equal, round, and reactive to light.  Neck: Normal range of motion. No JVD present. No thyromegaly present.  Cardiovascular: Normal rate, regular rhythm, normal heart sounds and intact distal pulses.  Exam reveals no gallop and no friction rub.   No murmur heard. Pulmonary/Chest: Effort normal and breath sounds normal. No respiratory distress. He has no wheezes. He has no rales. He exhibits no tenderness.  Abdominal: Soft. Bowel sounds are normal. He exhibits no distension and no mass. There is no tenderness. There is no rebound and no guarding.  Musculoskeletal: Normal range of motion. He exhibits tenderness. He exhibits no edema.  Lymphadenopathy:  He has no cervical adenopathy.  Neurological: He is alert and oriented to person, place, and time. He has normal reflexes. No cranial nerve deficit. He exhibits normal muscle tone. He displays a negative Romberg sign. Coordination and gait normal.  Skin: Skin is warm and dry. No rash noted.  Psychiatric: He has a normal mood and affect. His behavior is normal. Judgment and thought content normal.  LS tender  Lab Results  Component Value Date   WBC 17.4 (H) 10/05/2015   HGB  13.0 10/05/2015   HCT 39.7 10/05/2015   PLT 347.0 10/05/2015   GLUCOSE 100 (H) 10/05/2015   CHOL 182 10/04/2014   TRIG 116.0 10/04/2014   HDL 28.00 (L) 10/04/2014   LDLDIRECT 152.8 09/15/2012   LDLCALC 131 (H) 10/04/2014   ALT 14 (L) 09/26/2015   AST 18 09/26/2015   NA 139 10/05/2015   K 3.9 10/05/2015   CL 102 10/05/2015   CREATININE 0.86 10/05/2015   BUN 12 10/05/2015   CO2 30 10/05/2015   TSH 1.26 10/04/2014   PSA 1.19 10/04/2014   HGBA1C 4.9 10/04/2014    Dg Chest 2 View  Result Date: 09/25/2015 CLINICAL DATA:  Patient with pain and swelling to the left testicle. Possible sepsis. EXAM: CHEST  2 VIEW COMPARISON:  Chest radiograph 02/08/2015. FINDINGS: Normal cardiac and mediastinal contours. No consolidative pulmonary opacities. No pleural effusion or pneumothorax. Mid thoracic spine degenerative changes. IMPRESSION: No active cardiopulmonary disease. Electronically Signed   By: Annia Beltrew  Davis M.D.   On: 09/25/2015 19:00   [place] Scrotum  Result Date: 09/25/2015 CLINICAL DATA:  Left testicular pain. EXAM: SCROTAL ULTRASOUND DOPPLER ULTRASOUND OF THE TESTICLES TECHNIQUE: Complete ultrasound examination of the testicles, epididymis, and other scrotal structures was performed. Color and spectral Doppler ultrasound were also utilized to evaluate blood flow to the testicles. COMPARISON:  None. FINDINGS: Right testicle Measurements: 3.0 x 2.1 x 1.9 cm. No mass or microlithiasis visualized. Left testicle Measurements: 4.1 x 3.3 x 3.2 cm. The left testicles enlarged compared to the right with no masses. Right epididymis:  Normal in size and appearance. Left epididymis: Increased blood flow seen in the left epididymis compared to the right with no mass. Hydrocele:  There is a complex hydrocele on the left. Varicocele:  None visualized. Pulsed Doppler interrogation of both testes demonstrates normal blood flow on the right. There is decreased arterial an venous blood flow on the left compared to the  right. There is increased blood flow within the left epididymis. IMPRESSION: 1. The left testicle is enlarged with decreased arterial and venous blood flow compared to the right. Partial or intermittent torsion is not excluded based on imaging. There is also increased blood flow in the left epididymis which could be seen in the setting of epididymitis. There is a complex hydrocele on the left. Findings discussed with Dr. Jacquelyne BalintMcDermott Electronically Signed   By: Gerome Samavid  Williams III M.D   On: 09/25/2015 21:13   [place] Art/ven Flow Abd Pelv Doppler  Result Date: 09/25/2015 CLINICAL DATA:  Left testicular pain. EXAM: SCROTAL ULTRASOUND DOPPLER ULTRASOUND OF THE TESTICLES TECHNIQUE: Complete ultrasound examination of the testicles, epididymis, and other scrotal structures was performed. Color and spectral Doppler ultrasound were also utilized to evaluate blood flow to the testicles. COMPARISON:  None. FINDINGS: Right testicle Measurements: 3.0 x 2.1 x 1.9 cm. No mass or microlithiasis visualized. Left testicle Measurements: 4.1 x 3.3 x 3.2 cm. The left testicles enlarged compared to the right with no masses. Right epididymis:  Normal in size and appearance. Left epididymis: Increased blood flow seen in the left epididymis compared to the right with no mass. Hydrocele:  There is a complex hydrocele on the left. Varicocele:  None visualized. Pulsed Doppler interrogation of both testes demonstrates normal blood flow on the right. There is decreased arterial an venous blood flow on the left compared to the right. There is increased blood flow within the left epididymis. IMPRESSION: 1. The left testicle is enlarged with decreased arterial and venous blood flow compared to the right. Partial or intermittent torsion is not excluded based on imaging. There is also increased blood flow in the left epididymis which could be seen in the setting of epididymitis. There is a complex hydrocele on the left. Findings discussed with Dr.  Jacquelyne Balint Electronically Signed   By: Gerome Sam III M.D   On: 09/25/2015 21:13    Assessment & Plan:   Diagnoses and all orders for this visit:  WEIGHT GAIN -     tamsulosin (FLOMAX) 0.4 MG CAPS capsule; Take 1 capsule (0.4 mg total) by mouth daily. -     HYDROcodone-acetaminophen (NORCO) 10-325 MG tablet; Take 1 tablet by mouth every 6 (six) hours as needed for severe pain. Please fill on or after 11/16/15  ELEVATED BP -     tamsulosin (FLOMAX) 0.4 MG CAPS capsule; Take 1 capsule (0.4 mg total) by mouth daily. -     HYDROcodone-acetaminophen (NORCO) 10-325 MG tablet; Take 1 tablet by mouth every 6 (six) hours as needed for severe pain. Please fill on or after 11/16/15  Elevated PSA -     tamsulosin (FLOMAX) 0.4 MG CAPS capsule; Take 1 capsule (0.4 mg total) by mouth daily. -     HYDROcodone-acetaminophen (NORCO) 10-325 MG tablet; Take 1 tablet by mouth every 6 (six) hours as needed for severe pain. Please fill on or after 11/16/15  Chronic bilateral low back pain without sciatica -     HYDROcodone-acetaminophen (NORCO) 10-325 MG tablet; Take 1 tablet by mouth every 6 (six) hours as needed for severe pain. Please fill on or after 11/16/15  Other orders -     meloxicam (MOBIC) 15 MG tablet; Take 1 tablet (15 mg total) by mouth daily. -     celecoxib (CELEBREX) 200 MG capsule; Take 1 capsule (200 mg total) by mouth 2 (two) times daily as needed. -     amLODipine (NORVASC) 5 MG tablet; Take 1 tablet (5 mg total) by mouth daily.   I am having Mr. Hollomon maintain his tamsulosin, acetaminophen, meloxicam, amLODipine, celecoxib, polyethylene glycol powder, HYDROcodone-acetaminophen, and meclizine.  No orders of the defined types were placed in this encounter.    Follow-up: No Follow-up on file.  Sonda Primes, MD

## 2016-02-06 NOTE — Assessment & Plan Note (Signed)
F/u w/Urology 

## 2016-03-19 IMAGING — US US RENAL
1 series · 14 of 25 positions shown · non-contrast
Comparison: None.

CLINICAL DATA: UTI without hematuria.

EXAM:
RENAL / URINARY TRACT ULTRASOUND COMPLETE

[Series 1: us renal · 0.28mm/px · 14 of 48 slices shown]
[im 1/48]
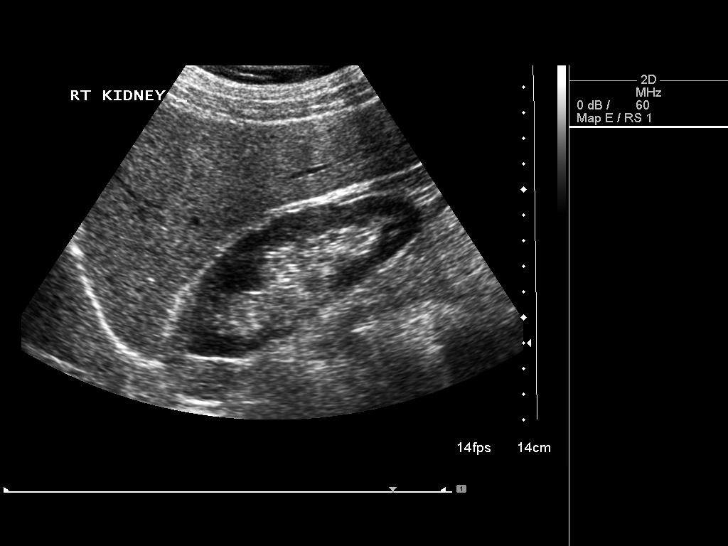
[im 4/48]
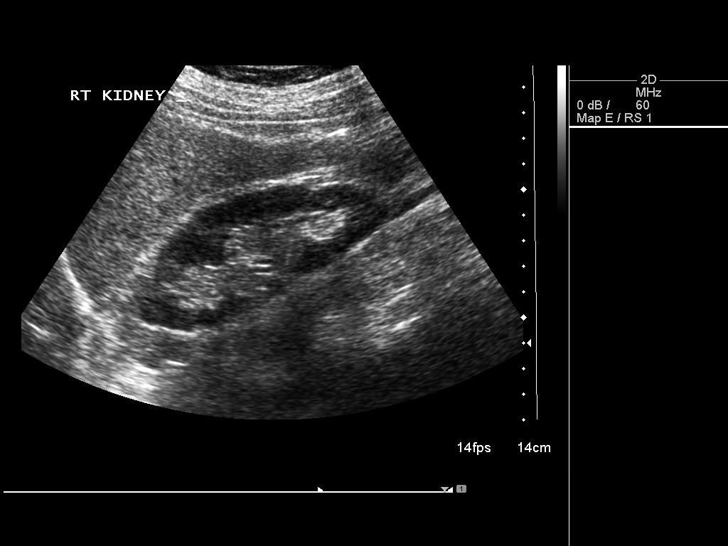
[im 8/48]
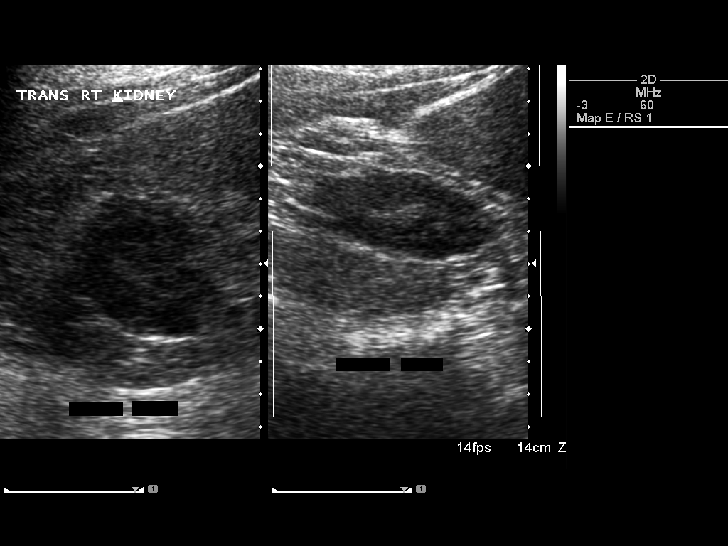
[im 12/48]
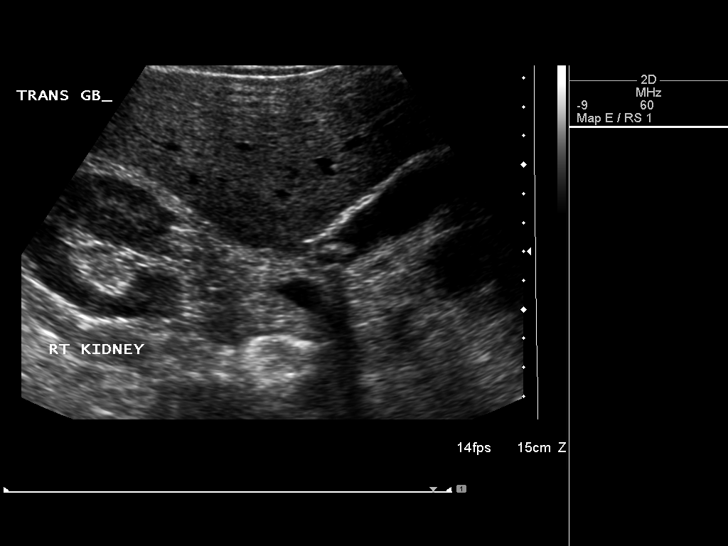
[im 16/48]
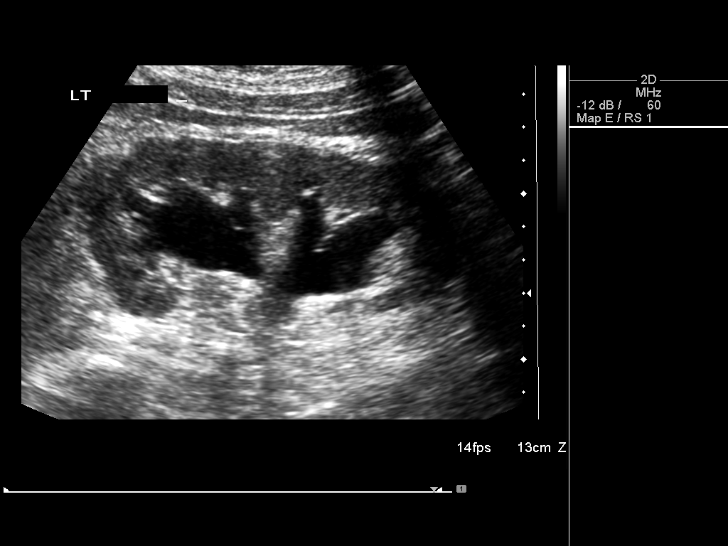
[im 18/48]
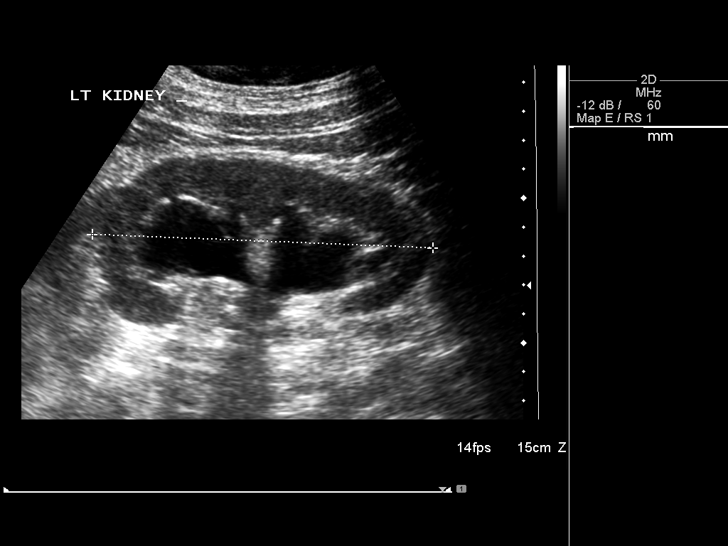
[im 22/48]
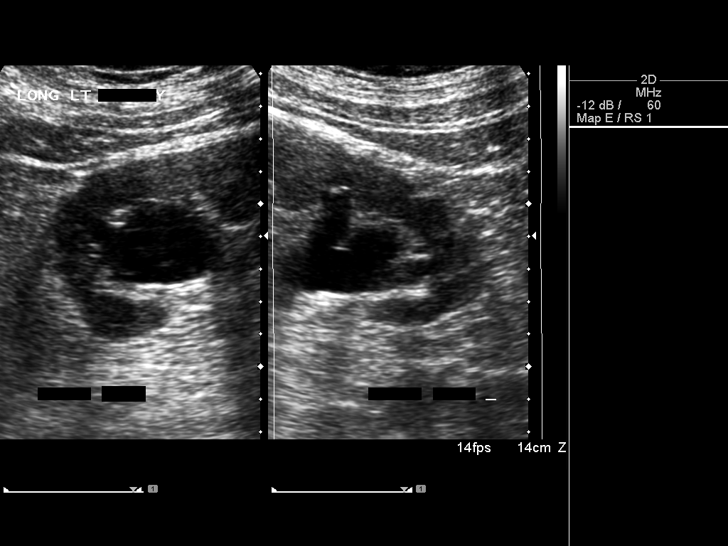
[im 26/48]
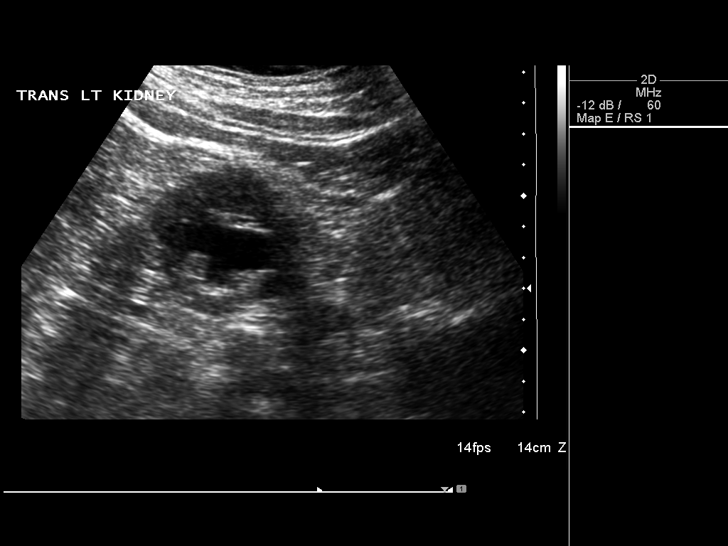
[im 30/48]
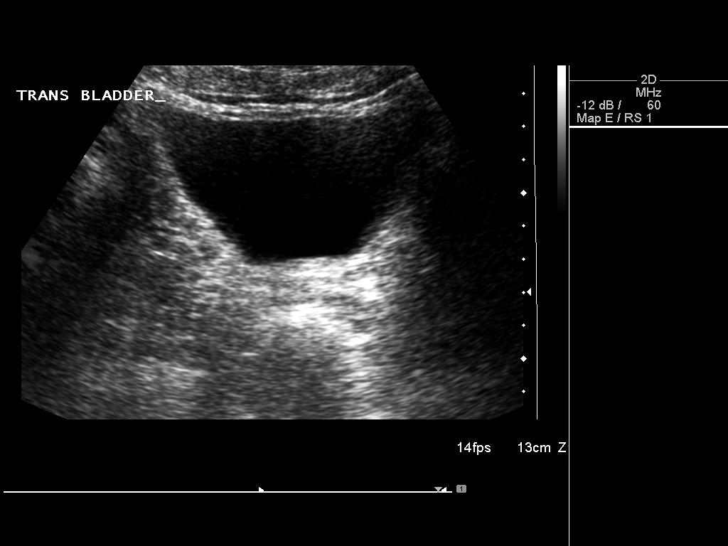
[im 32/48]
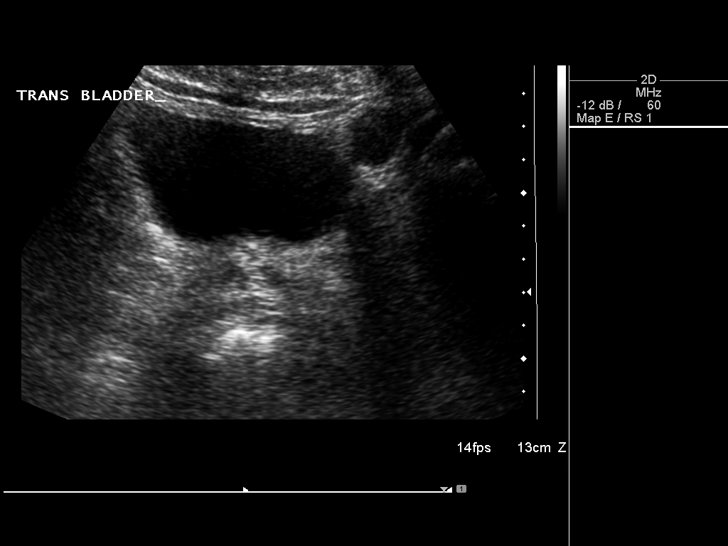
[im 36/48]
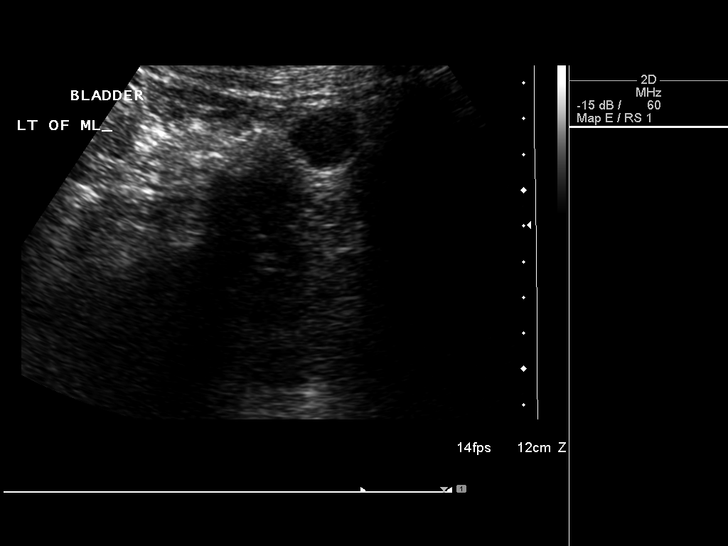
[im 40/48]
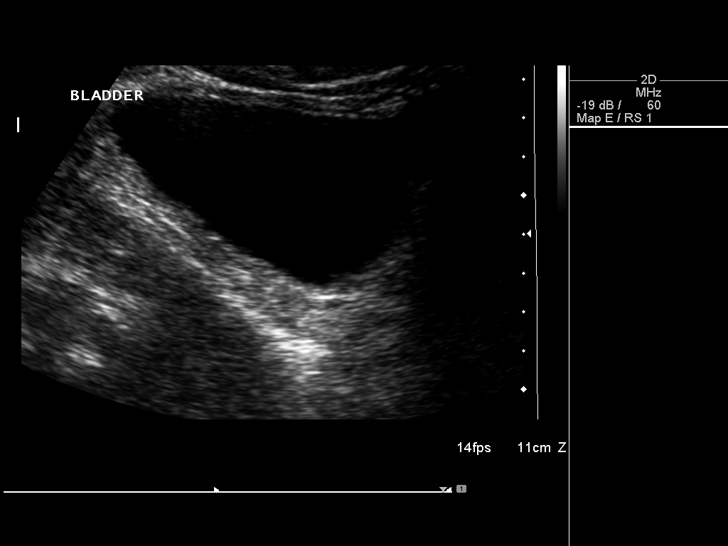
[im 44/48]
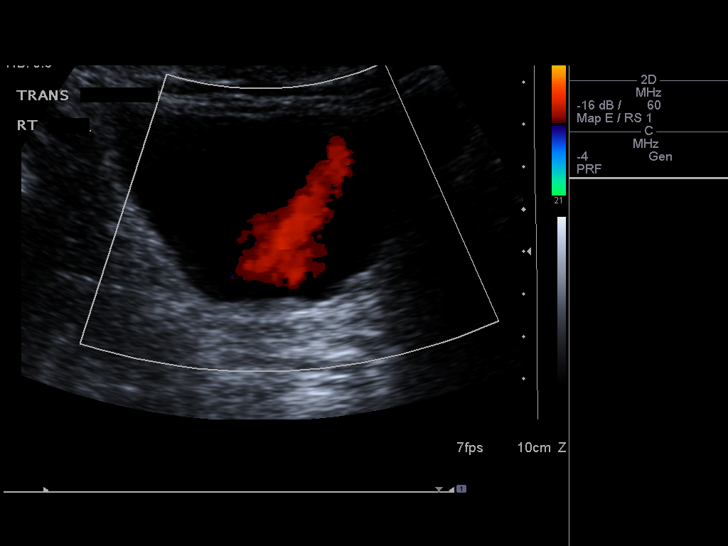
[im 48/48]
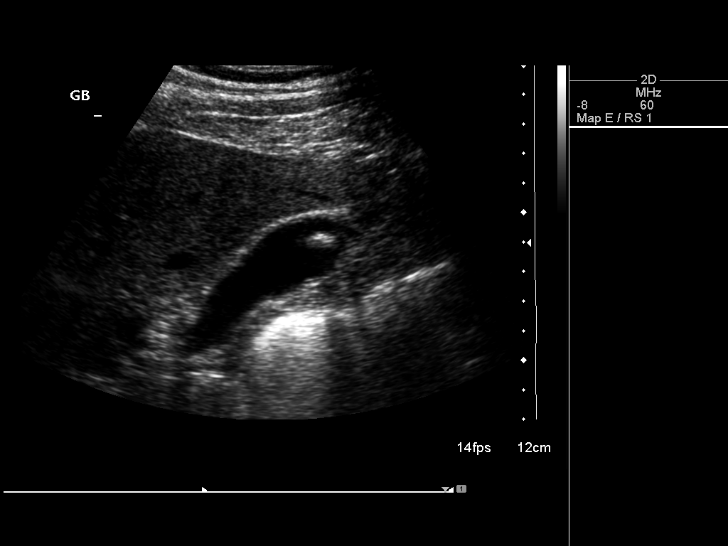

[14 of 25 positions shown; findings below may reference images not displayed]

FINDINGS: Right Kidney:

Length: 10.6 cm. Echogenicity within normal limits. No mass or
hydronephrosis visualized.

Left Kidney:

Length: 11.8 cm.. Moderate left hydronephrosis. Etiology not
apparent. Negative for mass or stone on the left. Normal left renal
cortex

Bladder:

Right ureteral jet identified. Left jet not visualized. 17 mm cyst
to the left of the bladder may represent a diverticulum .

Prostate enlargement

Gallstones
IMPRESSION: Moderate left hydronephrosis. Cyst to the left the bladder may
represent a diverticulum. Consider CT without and with contrast for
further evaluation to rule out stone or neoplasm.

Prostate enlargement

Cholelithiasis.

## 2016-04-12 ENCOUNTER — Ambulatory Visit: Payer: Medicare Other | Admitting: Internal Medicine

## 2016-04-24 ENCOUNTER — Ambulatory Visit (INDEPENDENT_AMBULATORY_CARE_PROVIDER_SITE_OTHER): Payer: Medicare Other | Admitting: Internal Medicine

## 2016-04-24 ENCOUNTER — Encounter: Payer: Self-pay | Admitting: Internal Medicine

## 2016-04-24 DIAGNOSIS — R634 Abnormal weight loss: Secondary | ICD-10-CM

## 2016-04-24 DIAGNOSIS — R42 Dizziness and giddiness: Secondary | ICD-10-CM

## 2016-04-24 DIAGNOSIS — I10 Essential (primary) hypertension: Secondary | ICD-10-CM | POA: Insufficient documentation

## 2016-04-24 DIAGNOSIS — E876 Hypokalemia: Secondary | ICD-10-CM

## 2016-04-24 DIAGNOSIS — M5440 Lumbago with sciatica, unspecified side: Secondary | ICD-10-CM | POA: Diagnosis not present

## 2016-04-24 MED ORDER — HYDROCODONE-ACETAMINOPHEN 7.5-325 MG PO TABS: 1.0000 | ORAL_TABLET | Freq: Four times a day (QID) | ORAL | 0 refills | Status: AC | PRN

## 2016-04-24 MED ORDER — AMLODIPINE BESYLATE 5 MG PO TABS: 2.5000 mg | ORAL_TABLET | Freq: Every day | ORAL | 11 refills | Status: AC

## 2016-04-24 NOTE — Assessment & Plan Note (Signed)
Will reduce Norvasc to 2.5 mg/d due to low BP

## 2016-04-24 NOTE — Assessment & Plan Note (Signed)
Gained wt 

## 2016-04-24 NOTE — Progress Notes (Signed)
Pre-visit discussion using our clinic review tool. No additional management support is needed unless otherwise documented below in the visit note.  

## 2016-04-24 NOTE — Assessment & Plan Note (Signed)
Labs

## 2016-04-24 NOTE — Progress Notes (Signed)
Subjective:  Patient ID: Gordon Lucas, male    DOB: 07-18-1950  Age: 66 y.o. MRN: [id number]  CC: Follow-up (back pain, ED, Insomnia )   HPI Gordon Lucas presents for LBP, HTN, OA f/u. C/o more LBP. C/o lightheadedness when getting up...  In the middle of our visit Gordon Lucas asked me for Viagra samples and I replied that I do not have any available. Gordon Lucas became belligerent and accused me of lying to him. He stated that he was here a couple days ago with a friend and that he saw the samples given out. At first I thought he was joking and asked him directly if he was calling me a liar. He confirmed that he did call me a liar because he knows that I do have Viagra samples.  I told Gordon Lucas that I do not appreciate being accused of lying and that we should terminate our doctor-patient relationship on the grounds of mistrust. He said it was OK with him and that he will find another doctor.   Outpatient Medications Prior to Visit  Medication Sig Dispense Refill  . acetaminophen (TYLENOL) 500 MG tablet Take 1,500 mg by mouth every 6 (six) hours as needed (pain).    . celecoxib (CELEBREX) 200 MG capsule Take 1 capsule (200 mg total) by mouth 2 (two) times daily as needed. 60 capsule 11  . gabapentin (NEURONTIN) 300 MG capsule Take 1 capsule (300 mg total) by mouth at bedtime. 60 capsule 5  . meclizine (ANTIVERT) 25 MG tablet Take 1 tablet (25 mg total) by mouth 3 (three) times daily as needed for dizziness. 30 tablet 0  . meloxicam (MOBIC) 15 MG tablet Take 1 tablet (15 mg total) by mouth daily. 30 tablet 11  . polyethylene glycol powder (GLYCOLAX/MIRALAX) powder MIX 17 GRAMS IN LIQUID AND DRINK DAILY AS NEEDED 527 g 3  . sildenafil (VIAGRA) 100 MG tablet Take 0.5 tablets (50 mg total) by mouth as needed for erectile dysfunction. 10 tablet 4  . tamsulosin (FLOMAX) 0.4 MG CAPS capsule Take 1 capsule (0.4 mg total) by mouth daily. 30 capsule 11  . amLODipine (NORVASC) 5 MG  tablet Take 1 tablet (5 mg total) by mouth daily. 30 tablet 11  . HYDROcodone-acetaminophen (NORCO) 7.5-325 MG tablet Take 1 tablet by mouth 4 (four) times daily as needed for moderate pain. Please fill on or after 04/16/16 110 tablet 0   No facility-administered medications prior to visit.     ROS Review of Systems  Gastrointestinal: Negative for abdominal distention, blood in stool and constipation.  Genitourinary: Negative for frequency and hematuria.  Musculoskeletal: Positive for back pain. Negative for gait problem, joint swelling and neck pain.  Skin: Negative for rash.  Neurological: Negative for dizziness, speech difficulty, weakness and light-headedness.    Objective:  BP 112/62   Pulse 99   Temp 98.4 F (36.9 C) (Oral)   Resp 16   Ht 5\' 7"  (1.702 m)   Wt 176 lb 8 oz (80.1 kg)   SpO2 98%   BMI 27.64 kg/m   BP Readings from Last 3 Encounters:  04/24/16 112/62  02/06/16 112/64  01/31/16 140/80    Wt Readings from Last 3 Encounters:  04/24/16 176 lb 8 oz (80.1 kg)  02/06/16 166 lb (75.3 kg)  01/31/16 164 lb 8 oz (74.6 kg)    Physical Exam  Constitutional: No distress.  Psychiatric: Judgment and thought content normal.   Not examined due to his belligerent behavior    >  20 min FTF discussing HTN/low BP issues and later discussing his "d/c from practice mechanism " following our disagreement re: samples  Lab Results  Component Value Date   WBC 17.4 (H) 10/05/2015   HGB 13.0 10/05/2015   HCT 39.7 10/05/2015   PLT 347.0 10/05/2015   GLUCOSE 100 (H) 10/05/2015   CHOL 182 10/04/2014   TRIG 116.0 10/04/2014   HDL 28.00 (L) 10/04/2014   LDLDIRECT 152.8 09/15/2012   LDLCALC 131 (H) 10/04/2014   ALT 14 (L) 09/26/2015   AST 18 09/26/2015   NA 139 10/05/2015   K 3.9 10/05/2015   CL 102 10/05/2015   CREATININE 0.86 10/05/2015   BUN 12 10/05/2015   CO2 30 10/05/2015   TSH 1.26 10/04/2014   PSA 1.19 10/04/2014   HGBA1C 4.9 10/04/2014    Dg Chest 2  View  Result Date: 09/25/2015 CLINICAL DATA:  Patient with pain and swelling to the left testicle. Possible sepsis. EXAM: CHEST  2 VIEW COMPARISON:  Chest radiograph 02/08/2015. FINDINGS: Normal cardiac and mediastinal contours. No consolidative pulmonary opacities. No pleural effusion or pneumothorax. Mid thoracic spine degenerative changes. IMPRESSION: No active cardiopulmonary disease. Electronically Signed   By: Annia Belt M.D.   On: 09/25/2015 19:00   US Scrotum  Result Date: 09/25/2015 CLINICAL DATA:  Left testicular pain. EXAM: SCROTAL ULTRASOUND DOPPLER ULTRASOUND OF THE TESTICLES TECHNIQUE: Complete ultrasound examination of the testicles, epididymis, and other scrotal structures was performed. Color and spectral Doppler ultrasound were also utilized to evaluate blood flow to the testicles. COMPARISON:  None. FINDINGS: Right testicle Measurements: 3.0 x 2.1 x 1.9 cm. No mass or microlithiasis visualized. Left testicle Measurements: 4.1 x 3.3 x 3.2 cm. The left testicles enlarged compared to the right with no masses. Right epididymis:  Normal in size and appearance. Left epididymis: Increased blood flow seen in the left epididymis compared to the right with no mass. Hydrocele:  There is a complex hydrocele on the left. Varicocele:  None visualized. Pulsed Doppler interrogation of both testes demonstrates normal blood flow on the right. There is decreased arterial an venous blood flow on the left compared to the right. There is increased blood flow within the left epididymis. IMPRESSION: 1. The left testicle is enlarged with decreased arterial and venous blood flow compared to the right. Partial or intermittent torsion is not excluded based on imaging. There is also increased blood flow in the left epididymis which could be seen in the setting of epididymitis. There is a complex hydrocele on the left. Findings discussed with Dr. Jacquelyne Balint Electronically Signed   By: Gerome Sam III M.D   On:  09/25/2015 21:13   [place] Art/ven Flow Abd Pelv Doppler  Result Date: 09/25/2015 CLINICAL DATA:  Left testicular pain. EXAM: SCROTAL ULTRASOUND DOPPLER ULTRASOUND OF THE TESTICLES TECHNIQUE: Complete ultrasound examination of the testicles, epididymis, and other scrotal structures was performed. Color and spectral Doppler ultrasound were also utilized to evaluate blood flow to the testicles. COMPARISON:  None. FINDINGS: Right testicle Measurements: 3.0 x 2.1 x 1.9 cm. No mass or microlithiasis visualized. Left testicle Measurements: 4.1 x 3.3 x 3.2 cm. The left testicles enlarged compared to the right with no masses. Right epididymis:  Normal in size and appearance. Left epididymis: Increased blood flow seen in the left epididymis compared to the right with no mass. Hydrocele:  There is a complex hydrocele on the left. Varicocele:  None visualized. Pulsed Doppler interrogation of both testes demonstrates normal blood flow on the right. There  is decreased arterial an venous blood flow on the left compared to the right. There is increased blood flow within the left epididymis. IMPRESSION: 1. The left testicle is enlarged with decreased arterial and venous blood flow compared to the right. Partial or intermittent torsion is not excluded based on imaging. There is also increased blood flow in the left epididymis which could be seen in the setting of epididymitis. There is a complex hydrocele on the left. Findings discussed with Dr. Jacquelyne Balint Electronically Signed   By: Gerome Sam III M.D   On: 09/25/2015 21:13    Assessment & Plan:   Adil was seen today for follow-up.  Diagnoses and all orders for this visit:  Acute right-sided low back pain with sciatica, sciatica laterality unspecified  Loss of weight  Hypokalemia  Dizzinesses  Essential hypertension  Other orders -     amLODipine (NORVASC) 5 MG tablet; Take 0.5 tablets (2.5 mg total) by mouth daily. -     HYDROcodone-acetaminophen  (NORCO) 7.5-325 MG tablet; Take 1 tablet by mouth 4 (four) times daily as needed for moderate pain. Please fill on or after 05/14/16   I have changed Gordon Lucas amLODipine and HYDROcodone-acetaminophen. I am also having him maintain his acetaminophen, polyethylene glycol powder, meclizine, tamsulosin, meloxicam, celecoxib, sildenafil, and gabapentin.  Meds ordered this encounter  Medications  . amLODipine (NORVASC) 5 MG tablet    Sig: Take 0.5 tablets (2.5 mg total) by mouth daily.    Dispense:  30 tablet    Refill:  11  . HYDROcodone-acetaminophen (NORCO) 7.5-325 MG tablet    Sig: Take 1 tablet by mouth 4 (four) times daily as needed for moderate pain. Please fill on or after 05/14/16    Dispense:  100 tablet    Refill:  0     Follow-up: No Follow-up on file.  Sonda Primes, MD

## 2016-04-24 NOTE — Assessment & Plan Note (Addendum)
Re-start Vit D Norco x1 month Rx given  Potential benefits of a long term opioids use as well as potential risks (i.e. addiction risk, apnea etc) and complications (i.e. Somnolence, constipation and others) were explained to the patient prior. UDS not obtained - see note   I'm discharging Mr Gordon Lucas from my practice - see note and d/c letter.

## 2016-04-24 NOTE — Assessment & Plan Note (Signed)
Will reduce Norvasc to 2.5 mg/d due to low BP 

## 2016-04-25 ENCOUNTER — Telehealth: Payer: Self-pay

## 2016-04-25 NOTE — Telephone Encounter (Signed)
Dismissal letter using new template printed and given to PCP to sign.

## 2016-04-26 NOTE — Telephone Encounter (Signed)
Rewrote letter with new verbiage.

## 2016-05-01 ENCOUNTER — Telehealth: Payer: Self-pay | Admitting: Internal Medicine

## 2016-05-01 NOTE — Telephone Encounter (Signed)
Patient dismissed from [facility] by Verlee MonteAleskei Plotnikov MD , effective April 26, 2016. Dismissal letter sent out by certified / registered mail. DAJ

## 2016-05-09 NOTE — Telephone Encounter (Signed)
Received signed domestic return receipt verifying delivery of certified letter on May 08, 2016. Article number [id number] DAJ

## 2016-07-18 ENCOUNTER — Other Ambulatory Visit: Payer: Self-pay | Admitting: *Deleted

## 2018-04-21 ENCOUNTER — Other Ambulatory Visit: Payer: Self-pay | Admitting: Orthopaedic Surgery

## 2018-04-21 DIAGNOSIS — M545 Low back pain, unspecified: Secondary | ICD-10-CM

## 2018-04-26 ENCOUNTER — Ambulatory Visit
Admission: RE | Admit: 2018-04-26 | Discharge: 2018-04-26 | Disposition: A | Discharge status: Home / Self Care | Payer: Medicare Other | Source: Ambulatory Visit | Attending: Orthopaedic Surgery | Admitting: Orthopaedic Surgery

## 2018-04-26 DIAGNOSIS — M545 Low back pain, unspecified: Secondary | ICD-10-CM

## 2020-05-16 ENCOUNTER — Other Ambulatory Visit: Payer: Self-pay | Admitting: Internal Medicine

## 2020-05-17 LAB — LIPID PANEL
Cholesterol: 188 mg/dL (ref <200)
HDL: 52 mg/dL (ref >=40)
LDL Cholesterol (Calc): 121 mg/dL (calc) — ABNORMAL HIGH
Non-HDL Cholesterol (Calc): 136 mg/dL (calc) — ABNORMAL HIGH (ref <130)
Total CHOL/HDL Ratio: 3.6 (calc) (ref <5.0)
Triglycerides: 63 mg/dL (ref <150)

## 2020-05-17 LAB — COMPLETE METABOLIC PANEL WITH GFR
AG Ratio: 1.5 (calc) (ref 1.0–2.5)
ALT: 19 U/L (ref 9–46)
AST: 13 U/L (ref 10–35)
Albumin: 4.1 g/dL (ref 3.6–5.1)
Alkaline phosphatase (APISO): 39 U/L (ref 35–144)
BUN: 13 mg/dL (ref 7–25)
CO2: 28 mmol/L (ref 20–32)
Calcium: 9.5 mg/dL (ref 8.6–10.3)
Chloride: 105 mmol/L (ref 98–110)
Creat: 0.8 mg/dL (ref 0.70–1.25)
GFR, Est African American: 106 mL/min/{1.73_m2} (ref >=60)
GFR, Est Non African American: 92 mL/min/{1.73_m2} (ref >=60)
Globulin: 2.8 g/dL (calc) (ref 1.9–3.7)
Glucose, Bld: 75 mg/dL (ref 65–99)
Potassium: 3.8 mmol/L (ref 3.5–5.3)
Sodium: 142 mmol/L (ref 135–146)
Total Bilirubin: 0.6 mg/dL (ref 0.2–1.2)
Total Protein: 6.9 g/dL (ref 6.1–8.1)

## 2020-05-17 LAB — CBC
HCT: 43.6 % (ref 38.5–50.0)
Hemoglobin: 14.3 g/dL (ref 13.2–17.1)
MCH: 30.5 pg (ref 27.0–33.0)
MCHC: 32.8 g/dL (ref 32.0–36.0)
MCV: 93 fL (ref 80.0–100.0)
MPV: 13.2 fL — ABNORMAL HIGH (ref 7.5–12.5)
Platelets: 221 10*3/uL (ref 140–400)
RBC: 4.69 10*6/uL (ref 4.20–5.80)
RDW: 11.9 % (ref 11.0–15.0)
WBC: 5.3 10*3/uL (ref 3.8–10.8)

## 2020-05-17 LAB — PSA: PSA: 2.27 ng/mL (ref <=4.0)

## 2020-05-17 LAB — VITAMIN D 25 HYDROXY (VIT D DEFICIENCY, FRACTURES): Vit D, 25-Hydroxy: 52 ng/mL (ref 30–100)

## 2020-05-17 LAB — TSH: TSH: 0.88 mIU/L (ref 0.40–4.50)

## 2021-05-05 ENCOUNTER — Other Ambulatory Visit: Payer: Self-pay | Admitting: Internal Medicine

## 2021-05-06 LAB — COMPLETE METABOLIC PANEL WITH GFR
AG Ratio: 1.4 (calc) (ref 1.0–2.5)
ALT: 18 U/L (ref 9–46)
AST: 19 U/L (ref 10–35)
Albumin: 4.3 g/dL (ref 3.6–5.1)
Alkaline phosphatase (APISO): 40 U/L (ref 35–144)
BUN: 13 mg/dL (ref 7–25)
CO2: 24 mmol/L (ref 20–32)
Calcium: 10 mg/dL (ref 8.6–10.3)
Chloride: 105 mmol/L (ref 98–110)
Creat: 0.79 mg/dL (ref 0.70–1.35)
Globulin: 3.1 g/dL (calc) (ref 1.9–3.7)
Glucose, Bld: 108 mg/dL — ABNORMAL HIGH (ref 65–99)
Potassium: 4.1 mmol/L (ref 3.5–5.3)
Sodium: 142 mmol/L (ref 135–146)
Total Bilirubin: 0.6 mg/dL (ref 0.2–1.2)
Total Protein: 7.4 g/dL (ref 6.1–8.1)
eGFR: 96 mL/min/{1.73_m2} (ref >=60)

## 2021-05-06 LAB — CBC
HCT: 49.4 % (ref 38.5–50.0)
Hemoglobin: 16.2 g/dL (ref 13.2–17.1)
MCH: 30.5 pg (ref 27.0–33.0)
MCHC: 32.8 g/dL (ref 32.0–36.0)
MCV: 93 fL (ref 80.0–100.0)
MPV: 13.4 fL — ABNORMAL HIGH (ref 7.5–12.5)
Platelets: 210 10*3/uL (ref 140–400)
RBC: 5.31 10*6/uL (ref 4.20–5.80)
RDW: 11.5 % (ref 11.0–15.0)
WBC: 5.4 10*3/uL (ref 3.8–10.8)

## 2021-05-06 LAB — LIPID PANEL
Cholesterol: 219 mg/dL — ABNORMAL HIGH (ref <200)
HDL: 57 mg/dL (ref >=40)
LDL Cholesterol (Calc): 143 mg/dL (calc) — ABNORMAL HIGH
Non-HDL Cholesterol (Calc): 162 mg/dL (calc) — ABNORMAL HIGH (ref <130)
Total CHOL/HDL Ratio: 3.8 (calc) (ref <5.0)
Triglycerides: 84 mg/dL (ref <150)

## 2021-05-06 LAB — VITAMIN D 25 HYDROXY (VIT D DEFICIENCY, FRACTURES): Vit D, 25-Hydroxy: 27 ng/mL — ABNORMAL LOW (ref 30–100)

## 2021-05-06 LAB — TSH: TSH: 0.33 mIU/L — ABNORMAL LOW (ref 0.40–4.50)

## 2021-05-06 LAB — PSA: PSA: 2.45 ng/mL (ref <=4.00)

## 2022-09-24 ENCOUNTER — Other Ambulatory Visit: Payer: Self-pay | Admitting: Internal Medicine

## 2022-09-25 LAB — TIQ- AMBIGUOUS ORDER

## 2022-09-28 LAB — BASIC METABOLIC PANEL WITH GFR
BUN: 9 mg/dL (ref 7–25)
CO2: 21 mmol/L (ref 20–32)
Calcium: 9.4 mg/dL (ref 8.6–10.3)
Chloride: 107 mmol/L (ref 98–110)
Creat: 0.76 mg/dL (ref 0.70–1.28)
Glucose, Bld: 82 mg/dL (ref 65–99)
Potassium: 3.4 mmol/L — ABNORMAL LOW (ref 3.5–5.3)
Sodium: 142 mmol/L (ref 135–146)
eGFR: 96 mL/min/{1.73_m2} (ref >=60)

## 2022-09-28 LAB — CBC
HCT: 39.9 % (ref 38.5–50.0)
Hemoglobin: 12.3 g/dL — ABNORMAL LOW (ref 13.2–17.1)
MCH: 30.9 pg (ref 27.0–33.0)
MCHC: 30.8 g/dL — ABNORMAL LOW (ref 32.0–36.0)
MCV: 100.3 fL — ABNORMAL HIGH (ref 80.0–100.0)
MPV: 13.3 fL — ABNORMAL HIGH (ref 7.5–12.5)
Platelets: 185 10*3/uL (ref 140–400)
RBC: 3.98 10*6/uL — ABNORMAL LOW (ref 4.20–5.80)
RDW: 11.7 % (ref 11.0–15.0)
WBC: 5.3 10*3/uL (ref 3.8–10.8)

## 2022-09-28 LAB — TEST AUTHORIZATION

## 2022-09-28 LAB — RPR (MONITOR) W/REFL: RPR (Monitor) w/refl Titer: NONREACTIVE

## 2022-09-28 LAB — HIV ANTIBODY (ROUTINE TESTING W REFLEX): HIV 1&2 Ab, 4th Generation: NONREACTIVE

## 2022-09-28 LAB — VITAMIN D 25 HYDROXY (VIT D DEFICIENCY, FRACTURES): Vit D, 25-Hydroxy: 89 ng/mL (ref 30–100)

## 2022-09-28 LAB — TSH: TSH: 0.58 mIU/L (ref 0.40–4.50)
